# Patient Record
Sex: Male | Born: 1935
Health system: Southern US, Community
[De-identification: ages and names within clinical notes are randomized; demographics above are authoritative.]

## PROBLEM LIST (undated history)

## (undated) DIAGNOSIS — Z87442 Personal history of urinary calculi: Secondary | ICD-10-CM

## (undated) DIAGNOSIS — G309 Alzheimer's disease, unspecified: Secondary | ICD-10-CM

## (undated) DIAGNOSIS — R9431 Abnormal electrocardiogram [ECG] [EKG]: Secondary | ICD-10-CM

## (undated) DIAGNOSIS — N259 Disorder resulting from impaired renal tubular function, unspecified: Secondary | ICD-10-CM

## (undated) DIAGNOSIS — E785 Hyperlipidemia, unspecified: Secondary | ICD-10-CM

## (undated) DIAGNOSIS — M109 Gout, unspecified: Secondary | ICD-10-CM

## (undated) DIAGNOSIS — F0280 Dementia in other diseases classified elsewhere without behavioral disturbance: Secondary | ICD-10-CM

## (undated) DIAGNOSIS — M25519 Pain in unspecified shoulder: Secondary | ICD-10-CM

## (undated) DIAGNOSIS — I10 Essential (primary) hypertension: Secondary | ICD-10-CM

## (undated) DIAGNOSIS — K8020 Calculus of gallbladder without cholecystitis without obstruction: Secondary | ICD-10-CM

## (undated) DIAGNOSIS — E1129 Type 2 diabetes mellitus with other diabetic kidney complication: Secondary | ICD-10-CM

## (undated) HISTORY — DX: Hyperlipidemia, unspecified: E78.5

## (undated) HISTORY — DX: Essential (primary) hypertension: I10

## (undated) HISTORY — DX: Disorder resulting from impaired renal tubular function, unspecified: N25.9

## (undated) HISTORY — DX: Personal history of urinary calculi: Z87.442

## (undated) HISTORY — DX: Pain in unspecified shoulder: M25.519

## (undated) HISTORY — DX: Type 2 diabetes mellitus with other diabetic kidney complication: E11.29

## (undated) HISTORY — DX: Abnormal electrocardiogram (ECG) (EKG): R94.31

## (undated) HISTORY — DX: Calculus of gallbladder without cholecystitis without obstruction: K80.20

## (undated) HISTORY — DX: Gout, unspecified: M10.9

---
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
## 1976-06-19 HISTORY — PX: NASAL SEPTUM SURGERY: SHX37

## 1999-06-16 ENCOUNTER — Encounter: Admission: RE | Admit: 1999-06-16 | Discharge: 1999-09-14 | Payer: Self-pay | Admitting: Endocrinology

## 2002-10-15 ENCOUNTER — Encounter: Payer: Self-pay | Admitting: Endocrinology

## 2002-10-15 ENCOUNTER — Encounter: Admission: RE | Admit: 2002-10-15 | Discharge: 2002-10-15 | Payer: Self-pay | Admitting: Endocrinology

## 2003-12-03 ENCOUNTER — Ambulatory Visit (HOSPITAL_COMMUNITY): Admission: RE | Admit: 2003-12-03 | Discharge: 2003-12-03 | Payer: Self-pay | Admitting: Endocrinology

## 2005-03-08 ENCOUNTER — Ambulatory Visit: Payer: Self-pay | Admitting: Endocrinology

## 2005-03-13 ENCOUNTER — Ambulatory Visit: Payer: Self-pay | Admitting: Endocrinology

## 2005-04-12 ENCOUNTER — Ambulatory Visit: Payer: Self-pay | Admitting: Endocrinology

## 2005-04-17 ENCOUNTER — Ambulatory Visit: Payer: Self-pay | Admitting: Endocrinology

## 2005-05-15 ENCOUNTER — Ambulatory Visit: Payer: Self-pay | Admitting: Endocrinology

## 2005-05-18 ENCOUNTER — Ambulatory Visit: Payer: Self-pay | Admitting: Endocrinology

## 2006-04-20 ENCOUNTER — Ambulatory Visit: Payer: Self-pay | Admitting: Endocrinology

## 2006-11-20 ENCOUNTER — Ambulatory Visit: Payer: Self-pay | Admitting: Endocrinology

## 2006-11-20 LAB — CONVERTED CEMR LAB: Hgb A1c MFr Bld: 5.9 % (ref 4.6–6.0)

## 2007-03-15 ENCOUNTER — Encounter: Payer: Self-pay | Admitting: *Deleted

## 2007-03-15 DIAGNOSIS — E785 Hyperlipidemia, unspecified: Secondary | ICD-10-CM

## 2007-03-15 DIAGNOSIS — M109 Gout, unspecified: Secondary | ICD-10-CM

## 2007-03-15 DIAGNOSIS — E1129 Type 2 diabetes mellitus with other diabetic kidney complication: Secondary | ICD-10-CM

## 2007-03-15 DIAGNOSIS — K8020 Calculus of gallbladder without cholecystitis without obstruction: Secondary | ICD-10-CM | POA: Insufficient documentation

## 2007-03-15 DIAGNOSIS — I10 Essential (primary) hypertension: Secondary | ICD-10-CM

## 2007-03-15 DIAGNOSIS — Z87442 Personal history of urinary calculi: Secondary | ICD-10-CM

## 2007-03-15 HISTORY — DX: Type 2 diabetes mellitus with other diabetic kidney complication: E11.29

## 2007-03-15 HISTORY — DX: Calculus of gallbladder without cholecystitis without obstruction: K80.20

## 2007-03-15 HISTORY — DX: Hyperlipidemia, unspecified: E78.5

## 2007-03-15 HISTORY — DX: Gout, unspecified: M10.9

## 2007-03-15 HISTORY — DX: Essential (primary) hypertension: I10

## 2007-03-15 HISTORY — DX: Personal history of urinary calculi: Z87.442

## 2007-03-20 ENCOUNTER — Ambulatory Visit: Payer: Self-pay | Admitting: Endocrinology

## 2007-04-23 ENCOUNTER — Ambulatory Visit: Payer: Self-pay | Admitting: Endocrinology

## 2007-04-24 LAB — CONVERTED CEMR LAB
ALT: 39 units/L (ref 0–53)
Bilirubin Urine: NEGATIVE
Bilirubin, Direct: 0.2 mg/dL (ref 0.0–0.3)
Calcium: 9.3 mg/dL (ref 8.4–10.5)
Cholesterol: 201 mg/dL (ref 0–200)
Creatinine,U: 232.8 mg/dL
Eosinophils Absolute: 0.1 10*3/uL (ref 0.0–0.6)
Eosinophils Relative: 1.7 % (ref 0.0–5.0)
GFR calc Af Amer: 60 mL/min
GFR calc non Af Amer: 49 mL/min
Glucose, Bld: 99 mg/dL (ref 70–99)
HDL: 38.9 mg/dL — ABNORMAL LOW (ref >39.0)
Hemoglobin, Urine: NEGATIVE
Hgb A1c MFr Bld: 5.7 % (ref 4.6–6.0)
Lymphocytes Relative: 28.5 % (ref 12.0–46.0)
MCV: 82.6 fL (ref 78.0–100.0)
Microalb Creat Ratio: 20.6 mg/g (ref 0.0–30.0)
Microalb, Ur: 4.8 mg/dL — ABNORMAL HIGH (ref 0.0–1.9)
Monocytes Relative: 7.1 % (ref 3.0–11.0)
Neutro Abs: 3.6 10*3/uL (ref 1.4–7.7)
Platelets: 146 10*3/uL — ABNORMAL LOW (ref 150–400)
Triglycerides: 161 mg/dL — ABNORMAL HIGH (ref 0–149)
Urine Glucose: NEGATIVE mg/dL
WBC: 5.8 10*3/uL (ref 4.5–10.5)

## 2007-04-29 ENCOUNTER — Ambulatory Visit: Payer: Self-pay | Admitting: Endocrinology

## 2007-08-23 ENCOUNTER — Ambulatory Visit: Payer: Self-pay | Admitting: Endocrinology

## 2007-08-23 LAB — CONVERTED CEMR LAB
BUN: 20 mg/dL (ref 6–23)
GFR calc Af Amer: 65 mL/min
Hgb A1c MFr Bld: 5.7 % (ref 4.6–6.0)
LDL Cholesterol: 120 mg/dL — ABNORMAL HIGH (ref 0–99)
Potassium: 3.8 meq/L (ref 3.5–5.1)
Total CHOL/HDL Ratio: 5.4
Triglycerides: 161 mg/dL — ABNORMAL HIGH (ref 0–149)
Uric Acid, Serum: 6 mg/dL (ref 2.4–7.0)
VLDL: 32 mg/dL (ref 0–40)

## 2007-09-18 ENCOUNTER — Emergency Department (HOSPITAL_COMMUNITY): Admission: EM | Admit: 2007-09-18 | Discharge: 2007-09-18 | Payer: Self-pay | Admitting: Emergency Medicine

## 2007-09-18 ENCOUNTER — Telehealth: Payer: Self-pay | Admitting: Endocrinology

## 2008-03-17 ENCOUNTER — Telehealth: Payer: Self-pay | Admitting: Internal Medicine

## 2008-04-03 ENCOUNTER — Ambulatory Visit: Payer: Self-pay | Admitting: Endocrinology

## 2008-04-29 ENCOUNTER — Ambulatory Visit: Payer: Self-pay | Admitting: Endocrinology

## 2008-04-29 DIAGNOSIS — M25519 Pain in unspecified shoulder: Secondary | ICD-10-CM

## 2008-04-29 HISTORY — DX: Pain in unspecified shoulder: M25.519

## 2008-10-06 ENCOUNTER — Telehealth: Payer: Self-pay | Admitting: Endocrinology

## 2008-10-09 ENCOUNTER — Telehealth: Payer: Self-pay | Admitting: Endocrinology

## 2009-03-30 ENCOUNTER — Ambulatory Visit: Payer: Self-pay | Admitting: Endocrinology

## 2009-04-30 ENCOUNTER — Encounter (INDEPENDENT_AMBULATORY_CARE_PROVIDER_SITE_OTHER): Payer: Self-pay | Admitting: *Deleted

## 2009-04-30 ENCOUNTER — Ambulatory Visit: Payer: Self-pay | Admitting: Endocrinology

## 2009-04-30 DIAGNOSIS — N259 Disorder resulting from impaired renal tubular function, unspecified: Secondary | ICD-10-CM

## 2009-04-30 DIAGNOSIS — D696 Thrombocytopenia, unspecified: Secondary | ICD-10-CM

## 2009-04-30 HISTORY — DX: Disorder resulting from impaired renal tubular function, unspecified: N25.9

## 2009-09-27 ENCOUNTER — Encounter: Payer: Self-pay | Admitting: Endocrinology

## 2009-10-29 ENCOUNTER — Ambulatory Visit: Payer: Self-pay | Admitting: Endocrinology

## 2009-10-29 LAB — CONVERTED CEMR LAB
Basophils Absolute: 0 10*3/uL (ref 0.0–0.1)
CO2: 31 meq/L (ref 19–32)
Chloride: 104 meq/L (ref 96–112)
GFR calc non Af Amer: 53.79 mL/min (ref >60)
Glucose, Bld: 97 mg/dL (ref 70–99)
HCT: 42.2 % (ref 39.0–52.0)
HDL: 37.5 mg/dL — ABNORMAL LOW (ref >39.00)
Lymphs Abs: 2.1 10*3/uL (ref 0.7–4.0)
Monocytes Absolute: 0.5 10*3/uL (ref 0.1–1.0)
Monocytes Relative: 7.3 % (ref 3.0–12.0)
Neutrophils Relative %: 62.1 % (ref 43.0–77.0)
Platelets: 151 10*3/uL (ref 150.0–400.0)
Potassium: 4.5 meq/L (ref 3.5–5.1)
RDW: 15.6 % — ABNORMAL HIGH (ref 11.5–14.6)
Sodium: 144 meq/L (ref 135–145)
Total CHOL/HDL Ratio: 5
Triglycerides: 178 mg/dL — ABNORMAL HIGH (ref 0.0–149.0)
VLDL: 35.6 mg/dL (ref 0.0–40.0)

## 2010-05-03 ENCOUNTER — Ambulatory Visit: Payer: Self-pay | Admitting: Endocrinology

## 2010-05-03 ENCOUNTER — Encounter: Payer: Self-pay | Admitting: Endocrinology

## 2010-05-03 DIAGNOSIS — R9431 Abnormal electrocardiogram [ECG] [EKG]: Secondary | ICD-10-CM

## 2010-05-03 HISTORY — DX: Abnormal electrocardiogram (ECG) (EKG): R94.31

## 2010-05-03 LAB — CONVERTED CEMR LAB
BUN: 16 mg/dL (ref 6–23)
Basophils Absolute: 0 10*3/uL (ref 0.0–0.1)
Bilirubin, Direct: 0.1 mg/dL (ref 0.0–0.3)
CO2: 29 meq/L (ref 19–32)
Chloride: 102 meq/L (ref 96–112)
Cholesterol: 159 mg/dL (ref 0–200)
Creatinine, Ser: 1.6 mg/dL — ABNORMAL HIGH (ref 0.4–1.5)
Eosinophils Absolute: 0.1 10*3/uL (ref 0.0–0.7)
Hgb A1c MFr Bld: 5.8 % (ref 4.6–6.5)
LDL Cholesterol: 101 mg/dL — ABNORMAL HIGH (ref 0–99)
Leukocytes, UA: NEGATIVE
MCHC: 31.9 g/dL (ref 30.0–36.0)
MCV: 84.5 fL (ref 78.0–100.0)
Monocytes Absolute: 0.4 10*3/uL (ref 0.1–1.0)
Neutrophils Relative %: 61.6 % (ref 43.0–77.0)
Nitrite: NEGATIVE
Platelets: 137 10*3/uL — ABNORMAL LOW (ref 150.0–400.0)
Specific Gravity, Urine: 1.02 (ref 1.000–1.030)
Total Bilirubin: 1 mg/dL (ref 0.3–1.2)
Triglycerides: 105 mg/dL (ref 0.0–149.0)
WBC: 6.1 10*3/uL (ref 4.5–10.5)
pH: 5.5 (ref 5.0–8.0)

## 2010-05-06 ENCOUNTER — Ambulatory Visit: Payer: Self-pay | Admitting: Endocrinology

## 2010-05-06 LAB — CONVERTED CEMR LAB
Iron: 115 ug/dL (ref 42–165)
Saturation Ratios: 38.8 % (ref 20.0–50.0)
Vitamin B-12: 738 pg/mL (ref 211–911)

## 2010-05-10 ENCOUNTER — Telehealth (INDEPENDENT_AMBULATORY_CARE_PROVIDER_SITE_OTHER): Payer: Self-pay | Admitting: Radiology

## 2010-05-11 ENCOUNTER — Encounter: Payer: Self-pay | Admitting: Endocrinology

## 2010-06-22 ENCOUNTER — Ambulatory Visit: Admit: 2010-06-22 | Payer: Self-pay | Admitting: Endocrinology

## 2010-07-17 LAB — CONVERTED CEMR LAB
ALT: 19 units/L (ref 0–53)
AST: 21 units/L (ref 0–37)
Albumin: 3.6 g/dL (ref 3.5–5.2)
Albumin: 3.9 g/dL (ref 3.5–5.2)
Alkaline Phosphatase: 48 units/L (ref 39–117)
BUN: 16 mg/dL (ref 6–23)
BUN: 26 mg/dL — ABNORMAL HIGH (ref 6–23)
Bacteria, UA: NEGATIVE
Basophils Absolute: 0 10*3/uL (ref 0.0–0.1)
Basophils Relative: 0.3 % (ref 0.0–3.0)
Bilirubin Urine: NEGATIVE
Bilirubin, Direct: 0.2 mg/dL (ref 0.0–0.3)
CO2: 31 meq/L (ref 19–32)
CO2: 32 meq/L (ref 19–32)
Calcium: 9.2 mg/dL (ref 8.4–10.5)
Chloride: 104 meq/L (ref 96–112)
Creatinine,U: 171.7 mg/dL
Crystals: NEGATIVE
Eosinophils Absolute: 0.1 10*3/uL (ref 0.0–0.7)
Eosinophils Absolute: 0.1 10*3/uL (ref 0.0–0.7)
Eosinophils Relative: 1.8 % (ref 0.0–5.0)
GFR calc Af Amer: 64 mL/min
GFR calc non Af Amer: 55.03 mL/min (ref >60)
Glucose, Bld: 105 mg/dL — ABNORMAL HIGH (ref 70–99)
Glucose, Bld: 95 mg/dL (ref 70–99)
HCT: 41.2 % (ref 39.0–52.0)
HCT: 41.8 % (ref 39.0–52.0)
HDL: 37.3 mg/dL — ABNORMAL LOW (ref >39.00)
Hemoglobin, Urine: NEGATIVE
Hemoglobin: 13.6 g/dL (ref 13.0–17.0)
Ketones, ur: NEGATIVE mg/dL
Ketones, ur: NEGATIVE mg/dL
Leukocytes, UA: NEGATIVE
Lymphocytes Relative: 35.2 % (ref 12.0–46.0)
Lymphs Abs: 2 10*3/uL (ref 0.7–4.0)
MCHC: 32.6 g/dL (ref 30.0–36.0)
MCV: 83.1 fL (ref 78.0–100.0)
Microalb Creat Ratio: 4.1 mg/g (ref 0.0–30.0)
Microalb, Ur: 0.7 mg/dL (ref 0.0–1.9)
Monocytes Absolute: 0.4 10*3/uL (ref 0.1–1.0)
Monocytes Relative: 7.6 % (ref 3.0–12.0)
Monocytes Relative: 7.6 % (ref 3.0–12.0)
Neutro Abs: 3.3 10*3/uL (ref 1.4–7.7)
Neutrophils Relative %: 58 % (ref 43.0–77.0)
PSA: 1.44 ng/mL (ref 0.10–4.00)
Platelets: 131 10*3/uL — ABNORMAL LOW (ref 150.0–400.0)
Potassium: 4.1 meq/L (ref 3.5–5.1)
RBC / HPF: NONE SEEN
RBC: 4.96 M/uL (ref 4.22–5.81)
RDW: 14.5 % (ref 11.5–14.6)
Sed Rate: 10 mm/hr (ref 0–16)
Sodium: 142 meq/L (ref 135–145)
Sodium: 142 meq/L (ref 135–145)
Specific Gravity, Urine: 1.015 (ref 1.000–1.03)
Squamous Epithelial / LPF: NEGATIVE /lpf
TSH: 1.56 microintl units/mL (ref 0.35–5.50)
TSH: 1.58 microintl units/mL (ref 0.35–5.50)
Total Bilirubin: 0.9 mg/dL (ref 0.3–1.2)
Total CHOL/HDL Ratio: 5.1
Total Protein, Urine: NEGATIVE mg/dL
Total Protein: 6.6 g/dL (ref 6.0–8.3)
Triglycerides: 123 mg/dL (ref 0.0–149.0)
Urine Glucose: NEGATIVE mg/dL
Urine Glucose: NEGATIVE mg/dL
Urobilinogen, UA: 0.2 (ref 0.0–1.0)
Urobilinogen, UA: 0.2 (ref 0.0–1.0)
WBC: 5.9 10*3/uL (ref 4.5–10.5)

## 2010-07-21 NOTE — Letter (Signed)
Summary: Geralynn Rile  [facility]   Imported By: Lester  09/30/2009 10:01:46  _____________________________________________________________________  External Attachment:    Type:   Image     Comment:   External Document

## 2010-07-21 NOTE — Assessment & Plan Note (Signed)
Summary: 6 mth yearly---stc   Vital Signs:  Patient profile:   75 year old male Height:      68 inches (172.72 cm) Weight:      253 pounds (115.00 kg) BMI:     38.61 O2 Sat:      98 % on Room air Temp:     98.5 degrees F (36.94 degrees C) oral Pulse rate:   62 / minute BP sitting:   124 / 76  (left arm) Cuff size:   large  Vitals Entered By: Brenton Grills CMA (AAMA) (May 03, 2010 8:21 AM)  O2 Flow:  Room air CC: Yearly Visit/aj Is Patient Diabetic? Yes   Primary Provider:  ellison  CC:  Yearly Visit/aj.  History of Present Illness: pt is here for medicare welllness visit.  he denies memory loss and depression.  he says he is able to perform activities of daily living without assistance.  he has no limitations to physical activity.     Current Medications (verified): 1)  Actos 45 Mg  Tabs (Pioglitazone Hcl) .... Take 1 By Mouth Qd 2)  Adult Aspirin Low Strength 81 Mg  Tbdp (Aspirin) .... Take 1 By Mouth Qd 3)  Allopurinol 300 Mg  Tabs (Allopurinol) .... Take 1 By Mouth Qd 4)  Hyzaar 100-25 Mg  Tabs (Losartan Potassium-Hctz) .... Take 1 By Mouth Qd 5)  Lasix 20 Mg  Tabs (Furosemide) .... Qd 6)  Cardura 4 Mg  Tabs (Doxazosin Mesylate) .... Qhs 7)  Colestipol Hcl 1 Gm  Tabs (Colestipol Hcl) .... 5 Qd 8)  Bayer Breeze 2 Test  Disk (Glucose Blood) .Marland Kitchen.. 1 Bid 9)  Metoprolol Succinate 25 Mg Xr24h-Tab (Metoprolol Succinate) .Marland Kitchen.. 1 Qd 10)  Amlodipine Besylate 5 Mg Tabs (Amlodipine Besylate) .Marland Kitchen.. 1 Once Daily  Allergies (verified): 1)  ! Lotensin 2)  ! * Caudet 3)  ! Zetia (Ezetimibe) 4)  Caduet (Amlodipine-Atorvastatin)  Past History:  Past Medical History: ROUTINE GENERAL MEDICAL EXAM@[email or web address]  CARE FACL (ICD-V70.0) SPECIAL SCREENING MALIGNANT NEOPLASM OF PROSTATE (ICD-V76.44) UROLITHIASIS, HX OF (ICD-V13.01) GALLSTONES (ICD-574.20) NEPHROPATHY, DIABETIC (ICD-250.40) HYPERTENSION (ICD-401.9) HYPERLIPIDEMIA (ICD-272.4) GOUT (ICD-274.9) DIABETES MELLITUS, TYPE II  (ICD-250.00)  opthal: dr Nile Riggs  Social History: Reviewed history from 04/29/2008 and no changes required. he is retired  divorced. nok is dtr angelia Grizzle nonsmoker no alcohol no illegal drugs  Review of Systems  The patient denies vision loss and decreased hearing.    Physical Exam  General:  obese.  no distress  Eyes:  (sees opthal) Ears:  grossly normal hearing.   Msk:  pt easily and quickly performs "get-up-and-go" from a sitting position. Cervical Nodes:  No significant adenopathy.  Psych:  remembers 2/3 at 5 minutes, but excellent recall.  can easily read and write a sentence.  alert and oriented x 3. Additional Exam:  SEPARATE EVALUATION FOLLOWS--EACH PROBLEM HERE IS NEW, NOT RESPONDING TO TREATMENT, OR POSES SIGNIFICANT RISK TO THE PATIENT'S HEALTH: HISTORY OF THE PRESENT ILLNESS: pt states few yesrs of slight swelling of the legs.  no assoc sob PAST MEDICAL HISTORY reviewed and up to date today REVIEW OF SYSTEMS: denies chest pain and chest tightness PHYSICAL EXAMINATION: dorsalis pedis intact bilat.   no deformity.  no ulcer on the feet.  feet are of normal color and temp.  there is 1+ bilat pretib edema sensation is intact to touch on the feet LAB/XRAY RESULTS: abnormal ecg is noted anemia is noted IMPRESSION: anemia, uncertain etiology abnormal ecg, uncertain etiology edema due to norvasc  PLAN: see instruction sheet   Impression & Recommendations:  Problem # 1:  ROUTINE GENERAL MEDICAL EXAM@HEALTH  CARE FACL (ICD-V70.0)  Medications Added to Medication List This Visit: 1)  Amlodipine Besylate 2.5 Mg Tabs (Amlodipine besylate) .Marland Kitchen.. 1 tab once daily  Other Orders: EKG w/ Interpretation (93000) Cardiolite (Cardiolite) TLB-Lipid Panel (80061-LIPID) TLB-BMP (Basic Metabolic Panel-BMET) (80048-METABOL) TLB-CBC Platelet - w/Differential (85025-CBCD) TLB-Hepatic/Liver Function Pnl (80076-HEPATIC) TLB-TSH (Thyroid Stimulating Hormone)  (84443-TSH) TLB-A1C / Hgb A1C (Glycohemoglobin) (83036-A1C) TLB-Udip w/ Micro (81001-URINE) TLB-Uric Acid, Blood (84550-URIC) TLB-PSA (Prostate Specific Antigen) (84153-PSA) Est. Patient Level IV (16109) Medicare -1st Annual Wellness Visit [phone])  Patient Instructions: 1)  blood tests are being ordered for you today.  please call [phone] to hear your test results. 2)  please consider these measures for your health:  minimize alcohol.  do not use tobacco products.  have a colonoscopy at least every 10 years from age 39.  keep firearms safely stored.  always use seat belts.  have working smoke alarms in your home.  see an eye doctor and dentist regularly.  never drive under the influence of alcohol or drugs (including prescription drugs).  3)  please let me know what your wishes would be, if artificial life support measures should become necessary.  it is critically important to prevent falling down (keep floor areas well-lit, dry, and free of loose objects). 4)  good diet and exercise habits significanly improve the control of your diabetes.  please let me know if you wish to be referred to a dietician.  high blood sugar is very risky to your health.  you should see an eye doctor every year. 5)  controlling your blood pressure and cholesterol drastically reduces the damage diabetes does to your body.  this also applies to quitting smoking.  please discuss these with your doctor.  you should take an aspirin every day, unless you have been advised by a doctor not to. 6)  Please schedule a follow-up appointment in 6 months. 7)  reduce amlodipine to 2.5 mg once daily 8)  check treadmill heart x ray.  you will be called with a day and time for an appointment. 9)  (update: i left message on phone-tree:  come in for iron panel and b-12 285.9) Prescriptions: AMLODIPINE BESYLATE 2.5 MG TABS (AMLODIPINE BESYLATE) 1 tab once daily  #30 x 11   Entered and Authorized by:   Minus Breeding MD   Signed by:    Minus Breeding MD on 05/03/2010   Method used:   Electronically to        [facility] (retail)       [address]       [place], [place]  [address]       Ph: [phone]       Fax: [phone]   RxID:   [id number]    Orders Added: 1)  EKG w/ Interpretation [93000] 2)  Cardiolite [Cardiolite] 3)  TLB-Lipid Panel [80061-LIPID] 4)  TLB-BMP (Basic Metabolic Panel-BMET) [80048-METABOL] 5)  TLB-CBC Platelet - w/Differential [85025-CBCD] 6)  TLB-Hepatic/Liver Function Pnl [80076-HEPATIC] 7)  TLB-TSH (Thyroid Stimulating Hormone) [84443-TSH] 8)  TLB-A1C / Hgb A1C (Glycohemoglobin) [83036-A1C] 9)  TLB-Udip w/ Micro [81001-URINE] 10)  TLB-Uric Acid, Blood [84550-URIC] 11)  TLB-PSA (Prostate Specific Antigen) [84153-PSA] 12)  Est. Patient Level IV [44034] 13)  Medicare -1st Annual Wellness Visit [G0438]   Immunization History:  Influenza Immunization History:    Influenza:  historical (03/19/2010)   Immunization History:  Influenza Immunization History:    Influenza:  Historical (03/19/2010)

## 2010-07-21 NOTE — Assessment & Plan Note (Signed)
Summary: 6 MTH FU  STC   Vital Signs:  Patient profile:   75 year old [name] Height:      68 inches (172.72 cm) Weight:      261.13 pounds (118.70 kg) O2 Sat:      96 % on Room air Temp:     98.4 degrees F (36.89 degrees C) oral Pulse rate:   80 / minute BP sitting:   102 / 64  (left arm) Cuff size:   large  Vitals Entered By: Josph Macho RMA (Oct 29, 2009 8:05 AM)  O2 Flow:  Room air CC: 6 month follow up/ CF Is Patient Diabetic? Yes   Primary Provider:  Verlisa Vara  CC:  6 month follow up/ CF.  History of Present Illness: the status of at least 3 ongoing medical problems is addressed today: dyslipidemia:  pt states he feels well in general.  he has not tolerated lipid meds. no cbg record, but states cbg's are well-controlled. htn:  he has ongoing edema.   Current Medications (verified): 1)  Actos 45 Mg  Tabs (Pioglitazone Hcl) .... Take 1 By Mouth Qd 2)  Adult Aspirin Low Strength 81 Mg  Tbdp (Aspirin) .... Take 1 By Mouth Qd 3)  Allopurinol 300 Mg  Tabs (Allopurinol) .... Take 1 By Mouth Qd 4)  Hyzaar 100-25 Mg  Tabs (Losartan Potassium-Hctz) .... Take 1 By Mouth Qd 5)  Zetia 10 Mg  Tabs (Ezetimibe) .... Take 1 By Mouth Qd 6)  Norvasc 10 Mg Tabs (Amlodipine Besylate) .... Take 1 Tablet By Mouth Once A Day 7)  Lasix Anthony Mg  Tabs (Furosemide) .... Qd 8)  Cardura 4 Mg  Tabs (Doxazosin Mesylate) .... Qhs 9)  Colestipol Hcl 1 Gm  Tabs (Colestipol Hcl) .... 5 Qd 10)  Bayer Breeze 2 Test  Disk (Glucose Blood) .Marland Kitchen.. 1 Bid 11)  Metoprolol Succinate 25 Mg Xr24h-Tab (Metoprolol Succinate) .Marland Kitchen.. 1 Qd  Allergies (verified): 1)  ! Lotensin 2)  ! * Caudet 3)  ! Zetia (Ezetimibe) 4)  Caduet (Amlodipine-Atorvastatin)  Past History:  Past Medical History: Last updated: 04/29/2008 ROUTINE GENERAL MEDICAL EXAM@HEALTH  CARE FACL (ICD-V70.0) SPECIAL SCREENING MALIGNANT NEOPLASM OF PROSTATE (ICD-V76.44) UROLITHIASIS, HX OF (ICD-V13.01) GALLSTONES (ICD-574.Anthony) NEPHROPATHY, DIABETIC  (ICD-250.40) HYPERTENSION (ICD-401.9) HYPERLIPIDEMIA (ICD-272.4) GOUT (ICD-274.9) DIABETES MELLITUS, TYPE II (ICD-250.00)  Review of Systems  The patient denies chest pain and dyspnea on exertion.    Physical Exam  General:  obese.   Pulses:  dorsalis pedis intact bilat.  no carotid bruit  Extremities:  no deformity.  no ulcer on the feet.  feet are of normal color and temp.   1+ right pedal edema and 1+ left pedal edema.   Neurologic:  sensation is intact to touch on the feet  Additional Exam:  Hemoglobin A1C            5.7 % LDL Cholesterol      [H]  161 mg/dL    Impression & Recommendations:  Problem # 1:  HYPERLIPIDEMIA (ICD-272.4) therapy is limited by multiple perceived drug intolerances  Problem # 2:  DIABETES MELLITUS, TYPE II (ICD-250.00) well-controlled  Problem # 3:  edema poss due to norvasc and actos  Problem # 4:  HYPERTENSION (ICD-401.9) overcontrolled  Medications Added to Medication List This Visit: 1)  Amlodipine Besylate 5 Mg Tabs (Amlodipine besylate) .Marland Kitchen.. 1 once daily  Other Orders: TLB-BMP (Basic Metabolic Panel-BMET) (80048-METABOL) TLB-A1C / Hgb A1C (Glycohemoglobin) (83036-A1C) TLB-Lipid Panel (80061-LIPID) TLB-CBC Platelet - w/Differential (85025-CBCD) Prescription Created  Electronically [phone]) Est. Patient Level IV (98119)  Patient Instructions: 1)  reduce amlodipine to 5 mg once daily 2)  Please schedule a physical appointment in 6 months. 3)  tests are being ordered for you today.  a few days after the test(s), please call [phone] to hear your test results. 4)  (update: i left message on phone-tree:  rx as we discussed) Prescriptions: AMLODIPINE BESYLATE 5 MG TABS (AMLODIPINE BESYLATE) 1 once daily  #90 x 3   Entered and Authorized by:   Minus Breeding MD   Signed by:   Minus Breeding MD on 10/29/2009   Method used:   Electronically to        [facility] (retail)       [address]       [place], [place]  [address]       Ph: [phone]       Fax: [phone]   RxID:   [id number] AMLODIPINE BESYLATE 5 MG TABS (AMLODIPINE BESYLATE) 1 once daily  #30 x 11   Entered and Authorized by:   Minus Breeding MD   Signed by:   Minus Breeding MD on 10/29/2009   Method used:   Electronically to        [facility] (retail)       [address]       [place], [place]  [address]       Ph: [phone]       Fax: [phone]   RxID:   [id number]

## 2010-07-21 NOTE — Miscellaneous (Signed)
Summary: Appointment No Show  Appointment status changed to no show by [employer] on 05/11/2010 11:03 AM.  No Show Comments ---------------- CRS/ABNORNAL ECHO/WT:253/INSU:MEDICARE/MD:ELLISON-MB  Appointment Information ----------------------- Appt Type:  CARDIOLOGY NUCLEAR TESTING      Date:  Wednesday, May 11, 2010      Time:  9:15 AM for 15 min   Urgency:  Routine   Made By:  Pearson Grippe  To Visit:  [id number]    Reason:  CRS/ABNORNAL ECHO/WT:253/INSU:MEDICARE/MD:ELLISON-MB  Appt Comments ------------- -- 05/11/10 11:03: (CEMR) NO SHOW -- CRS/ABNORNAL ECHO/WT:253/INSU:MEDICARE/MD:ELLISON-MB -- 05/03/10 9:17: (CEMR) BOOKED -- Routine CARDIOLOGY NUCLEAR TESTING at 05/11/2010 9:15 AM for 15 min CRS/ABNORNAL ECHO/WT:253/INSU:MEDICARE/MD:ELLISON-MB

## 2010-07-21 NOTE — Progress Notes (Signed)
Summary: nuc pre-procedure  Phone Note Outgoing Call   Call placed by: Domenic Polite, CNMT,  May 10, 2010 11:51 AM Call placed to: Patient Reason for Call: Confirm/change Appt Summary of Call: Tried to call patient several times , there was no answer. Initial call taken by: Domenic Polite, CNMT,  May 10, 2010 11:51 AM     Nuclear Med Background Indications for Stress Test: Evaluation for Ischemia, Abnormal EKG   History: Myocardial Perfusion Study  History Comments: '05 MPI apical thinning/ ef =69%     Nuclear Pre-Procedure Cardiac Risk Factors: Hypertension, Lipids, NIDDM Height (in): 68   Appended Document: nuc pre-procedure Pt no show'd today. Attempted to call. No answer and no answering machine.

## 2010-10-18 ENCOUNTER — Ambulatory Visit: Payer: Self-pay | Admitting: Endocrinology

## 2010-10-18 DIAGNOSIS — Z0289 Encounter for other administrative examinations: Secondary | ICD-10-CM

## 2010-10-28 ENCOUNTER — Encounter: Payer: Self-pay | Admitting: Endocrinology

## 2010-10-28 ENCOUNTER — Ambulatory Visit (INDEPENDENT_AMBULATORY_CARE_PROVIDER_SITE_OTHER): Payer: Medicare Other | Admitting: Endocrinology

## 2010-10-28 ENCOUNTER — Other Ambulatory Visit (INDEPENDENT_AMBULATORY_CARE_PROVIDER_SITE_OTHER): Payer: Medicare Other

## 2010-10-28 DIAGNOSIS — N259 Disorder resulting from impaired renal tubular function, unspecified: Secondary | ICD-10-CM

## 2010-10-28 DIAGNOSIS — D649 Anemia, unspecified: Secondary | ICD-10-CM

## 2010-10-28 DIAGNOSIS — E119 Type 2 diabetes mellitus without complications: Secondary | ICD-10-CM

## 2010-10-28 DIAGNOSIS — Z0000 Encounter for general adult medical examination without abnormal findings: Secondary | ICD-10-CM | POA: Insufficient documentation

## 2010-10-28 LAB — BASIC METABOLIC PANEL
BUN: 15 mg/dL (ref 6–23)
Calcium: 9.3 mg/dL (ref 8.4–10.5)
GFR: 62.39 mL/min (ref >60.00)
Glucose, Bld: 89 mg/dL (ref 70–99)
Sodium: 143 mEq/L (ref 135–145)

## 2010-10-28 LAB — CBC WITH DIFFERENTIAL/PLATELET
Eosinophils Absolute: 0.1 10*3/uL (ref 0.0–0.7)
Eosinophils Relative: 1.5 % (ref 0.0–5.0)
HCT: 40.7 % (ref 39.0–52.0)
Lymphs Abs: 2.4 10*3/uL (ref 0.7–4.0)
MCHC: 32.3 g/dL (ref 30.0–36.0)
MCV: 85.4 fl (ref 78.0–100.0)
Monocytes Absolute: 0.4 10*3/uL (ref 0.1–1.0)
Neutrophils Relative %: 61.1 % (ref 43.0–77.0)
Platelets: 138 10*3/uL — ABNORMAL LOW (ref 150.0–400.0)
WBC: 7.6 10*3/uL (ref 4.5–10.5)

## 2010-10-28 NOTE — Patient Instructions (Addendum)
blood tests are being ordered for you today.  please call [phone] to hear your test results.  You will be prompted to enter the 9-digit "MRN" number that appears at the top left of this page, followed by #.  Then you will hear the message. Stop amlodipine. Please make a regular physical appointment in 6 months (update: i left message on phone-tree:  rx as we discussed)

## 2010-10-28 NOTE — Progress Notes (Signed)
Subjective:    Patient ID: Anthony [name], male    DOB: Apr 13, 1936, 75 y.o.   MRN: [id number]  HPI He takes meds for dm and htn as rx'ed.  he has 1 year of slight edema of the legs, but no assoc sob.  Past Medical History  Diagnosis Date  . UROLITHIASIS, HX OF 03/15/2007  . DIABETES MELLITUS, TYPE II 03/15/2007  . NEPHROPATHY, DIABETIC 03/15/2007  . HYPERLIPIDEMIA 03/15/2007  . GOUT 03/15/2007  . HYPERTENSION 03/15/2007  . RENAL INSUFFICIENCY 04/30/2009  . ABNORMAL ELECTROCARDIOGRAM 05/03/2010  . SHOULDER PAIN, BILATERAL 04/29/2008  . GALLSTONES 03/15/2007    Past Surgical History  Procedure Date  . Nasal septum surgery 1978    History   Social History  . Marital Status: Divorced    Spouse Name: N/A    Number of Children: N/A  . Years of Education: N/A   Occupational History  . Retired    Social History Main Topics  . Smoking status: Never Smoker   . Smokeless tobacco: Not on file  . Alcohol Use: No  . Drug Use: No  . Sexually Active:    Other Topics Concern  . Not on file   Social History Narrative   NOK is Daughter Sharol Given    Current Outpatient Prescriptions on File Prior to Visit  Medication Sig Dispense Refill  . allopurinol (ZYLOPRIM) 300 MG tablet Take 300 mg by mouth daily.        Marland Kitchen aspirin 81 MG tablet Take 81 mg by mouth daily.        . colestipol (COLESTID) 1 G tablet 5 tablets once daily       . doxazosin (CARDURA) 4 MG tablet Take 4 mg by mouth at bedtime.        . furosemide (LASIX) 20 MG tablet Take 20 mg by mouth daily.        . Glucose Blood (BAYER BREEZE 2 TEST) DISK 1 BID       . losartan-hydrochlorothiazide (HYZAAR) 100-25 MG per tablet Take 1 tablet by mouth daily.        . metoprolol succinate (TOPROL-XL) 25 MG 24 hr tablet Take 25 mg by mouth daily.        . pioglitazone (ACTOS) 45 MG tablet Take 45 mg by mouth daily.          Allergies  Allergen Reactions  . Benazepril Hcl     REACTION: Cough  . Caduet     REACTION: LEG  CRAMPS  . Ezetimibe     REACTION: edema    Family History  Problem Relation Age of Onset  . Cancer Father     Prostate Cancer    BP 122/78  Pulse 55  Temp(Src) 98.8 F (37.1 C) (Oral)  Ht 5\' 8"  (1.727 m)  Wt 213 lb (96.616 kg)  BMI 32.39 kg/m2  SpO2 97%     Review of Systems No weight gain or weight loss    Objective:   Physical Exam GENERAL: no distress.  Obese.orsalis pedis intact bilat.   no deformity.  no ulcer on the feet.  feet are of normal color and temp.  there is 1+ bilat pretib edema sensation is intact to touch on the feet     Lab Results  Component Value Date   HGBA1C 5.6 10/28/2010     Assessment & Plan:  Htn, well-controlled.  bp can prob. tolerate discontinuation of norvasc. Edema persists. It is prob contributed to by both norvasc and actos.  Dm, well-controlled

## 2011-02-07 ENCOUNTER — Other Ambulatory Visit: Payer: Self-pay | Admitting: Endocrinology

## 2011-03-14 LAB — BASIC METABOLIC PANEL
BUN: 17
Creatinine, Ser: 1.36
GFR calc Af Amer: >60
GFR calc non Af Amer: 52 — ABNORMAL LOW
Potassium: 3.5

## 2011-03-14 LAB — CBC
Platelets: 153
RBC: 5.74
WBC: 7.2

## 2011-03-14 LAB — PROTIME-INR
INR: 0.9
Prothrombin Time: 12.1

## 2011-03-14 LAB — POCT CARDIAC MARKERS
CKMB, poc: <1 — ABNORMAL LOW
Operator id: 3067
Troponin i, poc: <0.05

## 2011-03-14 LAB — DIFFERENTIAL
Basophils Relative: 1
Eosinophils Absolute: 0.2
Lymphs Abs: 2.2
Neutro Abs: 4.2
Neutrophils Relative %: 59

## 2011-04-27 ENCOUNTER — Encounter: Payer: Self-pay | Admitting: Gastroenterology

## 2011-05-01 ENCOUNTER — Encounter: Payer: Self-pay | Admitting: Gastroenterology

## 2011-05-05 ENCOUNTER — Other Ambulatory Visit (INDEPENDENT_AMBULATORY_CARE_PROVIDER_SITE_OTHER): Payer: Medicare Other

## 2011-05-05 ENCOUNTER — Encounter: Payer: Self-pay | Admitting: Endocrinology

## 2011-05-05 ENCOUNTER — Ambulatory Visit (INDEPENDENT_AMBULATORY_CARE_PROVIDER_SITE_OTHER): Payer: Medicare Other | Admitting: Endocrinology

## 2011-05-05 VITALS — BP 122/70 | HR 56 | Temp 97.9°F | Ht 68.0 in | Wt 235.4 lb

## 2011-05-05 DIAGNOSIS — Z136 Encounter for screening for cardiovascular disorders: Secondary | ICD-10-CM

## 2011-05-05 DIAGNOSIS — Z79899 Other long term (current) drug therapy: Secondary | ICD-10-CM

## 2011-05-05 DIAGNOSIS — Z125 Encounter for screening for malignant neoplasm of prostate: Secondary | ICD-10-CM

## 2011-05-05 DIAGNOSIS — N259 Disorder resulting from impaired renal tubular function, unspecified: Secondary | ICD-10-CM

## 2011-05-05 DIAGNOSIS — D649 Anemia, unspecified: Secondary | ICD-10-CM

## 2011-05-05 DIAGNOSIS — E119 Type 2 diabetes mellitus without complications: Secondary | ICD-10-CM

## 2011-05-05 DIAGNOSIS — E785 Hyperlipidemia, unspecified: Secondary | ICD-10-CM

## 2011-05-05 DIAGNOSIS — I10 Essential (primary) hypertension: Secondary | ICD-10-CM

## 2011-05-05 DIAGNOSIS — D696 Thrombocytopenia, unspecified: Secondary | ICD-10-CM

## 2011-05-05 DIAGNOSIS — M109 Gout, unspecified: Secondary | ICD-10-CM

## 2011-05-05 DIAGNOSIS — Z0000 Encounter for general adult medical examination without abnormal findings: Secondary | ICD-10-CM

## 2011-05-05 DIAGNOSIS — Z23 Encounter for immunization: Secondary | ICD-10-CM

## 2011-05-05 DIAGNOSIS — Z129 Encounter for screening for malignant neoplasm, site unspecified: Secondary | ICD-10-CM | POA: Insufficient documentation

## 2011-05-05 LAB — CBC WITH DIFFERENTIAL/PLATELET
Basophils Absolute: 0 10*3/uL (ref 0.0–0.1)
Basophils Relative: 0.4 % (ref 0.0–3.0)
Eosinophils Absolute: 0.1 10*3/uL (ref 0.0–0.7)
Lymphocytes Relative: 26.4 % (ref 12.0–46.0)
MCHC: 31.6 g/dL (ref 30.0–36.0)
Neutrophils Relative %: 65.1 % (ref 43.0–77.0)
RBC: 4.84 Mil/uL (ref 4.22–5.81)
WBC: 6 10*3/uL (ref 4.5–10.5)

## 2011-05-05 LAB — URINALYSIS, ROUTINE W REFLEX MICROSCOPIC
Bilirubin Urine: NEGATIVE
Leukocytes, UA: NEGATIVE
Nitrite: NEGATIVE
Specific Gravity, Urine: 1.015 (ref 1.000–1.030)
pH: 7 (ref 5.0–8.0)

## 2011-05-05 LAB — BASIC METABOLIC PANEL
Calcium: 9.1 mg/dL (ref 8.4–10.5)
Creatinine, Ser: 1.7 mg/dL — ABNORMAL HIGH (ref 0.4–1.5)
GFR: 52.45 mL/min — ABNORMAL LOW (ref >60.00)

## 2011-05-05 LAB — MICROALBUMIN / CREATININE URINE RATIO
Creatinine,U: 178.7 mg/dL
Microalb, Ur: 0.9 mg/dL (ref 0.0–1.9)

## 2011-05-05 LAB — HEPATIC FUNCTION PANEL
ALT: 19 U/L (ref 0–53)
Albumin: 3.9 g/dL (ref 3.5–5.2)
Total Bilirubin: 1.1 mg/dL (ref 0.3–1.2)
Total Protein: 7.7 g/dL (ref 6.0–8.3)

## 2011-05-05 LAB — IBC PANEL
Saturation Ratios: 24.1 % (ref 20.0–50.0)
Transferrin: 213.1 mg/dL (ref 212.0–360.0)

## 2011-05-05 LAB — HEMOGLOBIN A1C: Hgb A1c MFr Bld: 5.5 % (ref 4.6–6.5)

## 2011-05-05 LAB — URIC ACID: Uric Acid, Serum: 5.9 mg/dL (ref 4.0–7.8)

## 2011-05-05 LAB — LIPID PANEL: Total CHOL/HDL Ratio: 4

## 2011-05-05 NOTE — Patient Instructions (Addendum)
We can't do your psa blood test today, because of a computer problem.   please consider these measures for your health:  minimize alcohol.  do not use tobacco products.  have a colonoscopy at least every 10 years from age 75.  keep firearms safely stored.  always use seat belts.  have working smoke alarms in your home.  see an eye doctor and dentist regularly.  never drive under the influence of alcohol or drugs (including prescription drugs).   Reduce metoprolol to 1/2 pill daily. please let me know what your wishes would be, if artificial life support measures should become necessary.  it is critically important to prevent falling down (keep floor areas well-lit, dry, and free of loose objects.  If you have a cane, walker, or wheelchair, you should use it, even for short trips around the house.  Also, try not to rush). You should have a vaccine against shingles (a painful rash which results from the  chickenpox infection which most people had many years ago).  This vaccine reduces, but does not totally eliminate the risk of shingles.  Because this is a medicare part d benefit, there are 3 ways you can get it:  You can go to a pharmacy and get the injection (I can give you a prescription), or I can give you a prescription to have filled at a pharmacy, and bring back here for [place] to give.  The other option is that you can pay up-front for it, and we'll give you a form to make a claim for reimbursement from your medicare part d carrier.  Please come back for a follow-up appointment in 6 months. (update: pt requests full code) blood tests are being requested for you today.  please call [phone] to hear your test results.  You will be prompted to enter the 9-digit "MRN" number that appears at the top left of this page, followed by #.  Then you will hear the message. (update: i left message on phone-tree:  rx as we discussed)

## 2011-05-05 NOTE — Progress Notes (Signed)
Subjective:    Patient ID: Anthony [name], male    DOB: 02-Jul-1935, 75 y.o.   MRN: [id number]  HPI The state of at least three ongoing medical problems is addressed today: Pt has h/o low plts, uncertain etiology.  No bleeding. DM: He has lost weight, due to his efforts. Renal insuff: he denies chest pain and sob Dyslipidemia: he did not tolerate statin rx. Past Medical History  Diagnosis Date  . UROLITHIASIS, HX OF 03/15/2007  . DIABETES MELLITUS, TYPE II 03/15/2007  . NEPHROPATHY, DIABETIC 03/15/2007  . HYPERLIPIDEMIA 03/15/2007  . GOUT 03/15/2007  . HYPERTENSION 03/15/2007  . RENAL INSUFFICIENCY 04/30/2009  . ABNORMAL ELECTROCARDIOGRAM 05/03/2010  . SHOULDER PAIN, BILATERAL 04/29/2008  . GALLSTONES 03/15/2007    Past Surgical History  Procedure Date  . Nasal septum surgery 1978    History   Social History  . Marital Status: Divorced    Spouse Name: N/A    Number of Children: N/A  . Years of Education: N/A   Occupational History  . Retired    Social History Main Topics  . Smoking status: Never Smoker   . Smokeless tobacco: Not on file  . Alcohol Use: No  . Drug Use: No  . Sexually Active:    Other Topics Concern  . Not on file   Social History Narrative   NOK is Daughter Sharol Given    Current Outpatient Prescriptions on File Prior to Visit  Medication Sig Dispense Refill  . ACTOS 45 MG tablet TAKE ONE TABLET BY MOUTH EVERY DAY  90 each  1  . allopurinol (ZYLOPRIM) 300 MG tablet Take 300 mg by mouth daily.        Marland Kitchen aspirin 81 MG tablet Take 81 mg by mouth daily.        . colestipol (COLESTID) 1 G tablet 5 tablets once daily       . doxazosin (CARDURA) 4 MG tablet TAKE ONE TABLET BY MOUTH EVERY DAY AT BEDTIME  90 tablet  1  . furosemide (LASIX) 20 MG tablet Take 20 mg by mouth daily.        . Glucose Blood (BAYER BREEZE 2 TEST) DISK 1 BID       . losartan-hydrochlorothiazide (HYZAAR) 100-25 MG per tablet TAKE ONE TABLET BY MOUTH EVERY DAY  90 tablet  1    . metoprolol succinate (TOPROL-XL) 25 MG 24 hr tablet 1/2 tab daily        Allergies  Allergen Reactions  . Benazepril Hcl     REACTION: Cough  . Caduet     REACTION: LEG CRAMPS  . Ezetimibe     REACTION: edema    Family History  Problem Relation Age of Onset  . Cancer Father     Prostate Cancer    BP 122/70  Pulse 56  Temp(Src) 97.9 F (36.6 C) (Oral)  Ht 5\' 8"  (1.727 m)  Wt 235 lb 6 oz (106.765 kg)  BMI 35.79 kg/m2  SpO2 98%    Review of Systems  Genitourinary: Negative for difficulty urinating.  Neurological: Negative for syncope.      Objective:   Physical Exam VITAL SIGNS:  See vs page GENERAL: no distress Pulses: dorsalis pedis intact bilat.   Feet: no deformity.  no ulcer on the feet.  feet are of normal color and temp.  Trace bilat leg edema Neuro: sensation is intact to touch on the feet.   i reviewed electrocardiogram Lab Results  Component Value Date   WBC  6.0 05/05/2011   HGB 13.0 05/05/2011   HCT 41.2 05/05/2011   PLT 132.0* 05/05/2011   GLUCOSE 93 05/05/2011   CHOL 175 05/05/2011   TRIG 78.0 05/05/2011   HDL 50.00 05/05/2011   LDLDIRECT 136.7 04/30/2009   LDLCALC 109* 05/05/2011   ALT 19 05/05/2011   AST 24 05/05/2011   NA 143 05/05/2011   K 3.9 05/05/2011   CL 105 05/05/2011   CREATININE 1.7* 05/05/2011   BUN 21 05/05/2011   CO2 29 05/05/2011   TSH 1.38 05/05/2011   PSA 2.02 05/03/2010   INR 0.9 09/18/2007   HGBA1C 5.5 05/05/2011   MICROALBUR 0.9 05/05/2011      Assessment & Plan:  Thrombocytopenia, unchanged DM, well-controlled Renal insuff, stable Dyslipidemia. therapy is limited by perceived drug intolerance   Subjective:   Patient here for Medicare annual wellness visit and management of other chronic and acute problems.     Risk factors: advanced age    Roster of Physicians Providing Medical Care to Patient: Opthal: shapiro    Activities of Daily Living: In your present state of health, do you have any  difficulty performing the following activities?:  Preparing food and eating?: No  Bathing yourself: No  Getting dressed: No  Using the toilet: No  Moving around from place to place: No  In the past year have you fallen or had a near fall?: No    Home Safety: Has smoke detector and wears seat belts. No firearms.   Diet and Exercise  Current exercise habits:  Pt says good Dietary issues discussed:  Pt reports a healthy diet   Depression Screen  Q1: Over the past two weeks, have you felt down, depressed or hopeless? no  Q2: Over the past two weeks, have you felt little interest or pleasure in doing things? no   The following portions of the patient's history were reviewed and updated as appropriate: allergies, current medications, past family history, past medical history, past social history, past surgical history and problem list.  Past Medical History  Diagnosis Date  . UROLITHIASIS, HX OF 03/15/2007  . DIABETES MELLITUS, TYPE II 03/15/2007  . NEPHROPATHY, DIABETIC 03/15/2007  . HYPERLIPIDEMIA 03/15/2007  . GOUT 03/15/2007  . HYPERTENSION 03/15/2007  . RENAL INSUFFICIENCY 04/30/2009  . ABNORMAL ELECTROCARDIOGRAM 05/03/2010  . SHOULDER PAIN, BILATERAL 04/29/2008  . GALLSTONES 03/15/2007    Past Surgical History  Procedure Date  . Nasal septum surgery 1978    History   Social History  . Marital Status: Divorced    Spouse Name: N/A    Number of Children: N/A  . Years of Education: N/A   Occupational History  . Retired    Social History Main Topics  . Smoking status: Never Smoker   . Smokeless tobacco: Not on file  . Alcohol Use: No  . Drug Use: No  . Sexually Active:    Other Topics Concern  . Not on file   Social History Narrative   NOK is Daughter Sharol Given    Current Outpatient Prescriptions on File Prior to Visit  Medication Sig Dispense Refill  . ACTOS 45 MG tablet TAKE ONE TABLET BY MOUTH EVERY DAY  90 each  1  . allopurinol (ZYLOPRIM) 300 MG tablet  Take 300 mg by mouth daily.        Marland Kitchen aspirin 81 MG tablet Take 81 mg by mouth daily.        . colestipol (COLESTID) 1 G tablet 5 tablets once daily       .  doxazosin (CARDURA) 4 MG tablet TAKE ONE TABLET BY MOUTH EVERY DAY AT BEDTIME  90 tablet  1  . furosemide (LASIX) 20 MG tablet Take 20 mg by mouth daily.        . Glucose Blood (BAYER BREEZE 2 TEST) DISK 1 BID       . losartan-hydrochlorothiazide (HYZAAR) 100-25 MG per tablet TAKE ONE TABLET BY MOUTH EVERY DAY  90 tablet  1  . metoprolol succinate (TOPROL-XL) 25 MG 24 hr tablet 1/2 tab daily        Allergies  Allergen Reactions  . Benazepril Hcl     REACTION: Cough  . Caduet     REACTION: LEG CRAMPS  . Ezetimibe     REACTION: edema    Family History  Problem Relation Age of Onset  . Cancer Father     Prostate Cancer    BP 122/70  Pulse 56  Temp(Src) 97.9 F (36.6 C) (Oral)  Ht 5\' 8"  (1.727 m)  Wt 235 lb 6 oz (106.765 kg)  BMI 35.79 kg/m2  SpO2 98%   Review of Systems  Denies hearing loss, and visual loss Objective:   Vision:  Sees opthalmologist Hearing: grossly normal Body mass index:  See vs page Msk: pt easily and quickly performs "get-up-and-go" from a sitting position Cognitive Impairment Assessment: cognition, memory and judgment appear normal.  remembers 3/3 at 5 minutes.  excellent recall.  can easily read and write a sentence.  alert and oriented x 3   Assessment:   Medicare wellness utd on preventive parameters    Plan:   During the course of the visit the patient was educated and counseled about appropriate screening and preventive services including:       Fall prevention   Diabetes screening  Nutrition counseling   Vaccines / LABS Zostavax / Pnemonccoal Vaccine  today  PSA  Patient Instructions (the written plan) was given to the patient.

## 2011-05-08 ENCOUNTER — Ambulatory Visit (AMBULATORY_SURGERY_CENTER): Payer: Medicare Other | Admitting: *Deleted

## 2011-05-08 VITALS — Ht 68.0 in | Wt 234.9 lb

## 2011-05-08 DIAGNOSIS — Z1211 Encounter for screening for malignant neoplasm of colon: Secondary | ICD-10-CM

## 2011-05-08 MED ORDER — PEG-KCL-NACL-NASULF-NA ASC-C 100 G PO SOLR: 1.0000 | Freq: Once | ORAL | Status: DC

## 2011-05-09 ENCOUNTER — Other Ambulatory Visit: Payer: Self-pay | Admitting: Endocrinology

## 2011-05-15 ENCOUNTER — Other Ambulatory Visit: Payer: Self-pay | Admitting: *Deleted

## 2011-05-15 MED ORDER — COLESTIPOL HCL 1 G PO TABS: ORAL_TABLET | ORAL | Status: DC

## 2011-05-15 NOTE — Telephone Encounter (Signed)
R'cd fax from [facility] requesting refill for Colestid  Last OV-04/2011 Last filled-02/07/2011

## 2011-05-25 ENCOUNTER — Ambulatory Visit (AMBULATORY_SURGERY_CENTER): Payer: Medicare Other | Admitting: Gastroenterology

## 2011-05-25 ENCOUNTER — Other Ambulatory Visit: Payer: Self-pay | Admitting: Gastroenterology

## 2011-05-25 ENCOUNTER — Encounter: Payer: Self-pay | Admitting: Gastroenterology

## 2011-05-25 VITALS — HR 58 | Temp 96.7°F | Resp 22 | Ht 68.0 in | Wt 229.0 lb

## 2011-05-25 DIAGNOSIS — Z1211 Encounter for screening for malignant neoplasm of colon: Secondary | ICD-10-CM

## 2011-05-25 LAB — GLUCOSE, CAPILLARY
Glucose-Capillary: 104 mg/dL — ABNORMAL HIGH (ref 70–99)
Glucose-Capillary: 96 mg/dL (ref 70–99)
Glucose-Capillary: 65 mg/dL — ABNORMAL LOW (ref 70–99)
Glucose-Capillary: 84 mg/dL (ref 70–99)

## 2011-05-25 MED ORDER — SODIUM CHLORIDE 0.9 % IV SOLN: 500.0000 mL | INTRAVENOUS | Status: DC

## 2011-05-25 NOTE — Progress Notes (Signed)
Patient did not experience any of the following events: a burn prior to discharge; a fall within the facility; wrong site/side/patient/procedure/implant event; or a hospital transfer or hospital admission upon discharge from the facility. (G8907) Patient did not have preoperative order for IV antibiotic SSI prophylaxis. (G8918)  

## 2011-05-25 NOTE — Op Note (Addendum)
[facility] [address] [place], [place]  [address]  COLONOSCOPY PROCEDURE REPORT  PATIENT:  Anthony [name], Anthony [name]  MR#:  [id number] BIRTHDATE:  1936-04-16, 75 yrs. old  GENDER:  male ENDOSCOPIST:  Judie Petit T. Russella Dar, MD, [facility]  PROCEDURE DATE:  05/25/2011 PROCEDURE:  Average-risk screening colonoscopy A2130 ASA CLASS:  Class II INDICATIONS:  1) Routine Risk Screening MEDICATIONS:   MAC sedation, administered by CRNA, propofol (Diprivan) 150 mg IV DESCRIPTION OF PROCEDURE:   After the risks benefits and alternatives of the procedure were thoroughly explained, informed consent was obtained.  Digital rectal exam was performed and revealed no abnormalities.   The [id number] endoscope was introduced through the anus and advanced to the cecum, which was identified by both the appendix and ileocecal valve, without limitations.  The quality of the prep was good, using MoviPrep. The instrument was then slowly withdrawn as the colon was fully examined. <<PROCEDUREIMAGES>> FINDINGS:  Moderate diverticulosis was found in the sigmoid colon. Otherwise normal colonoscopy without other polyps, masses, vascular ectasias, or inflammatory changes.   Retroflexed views in the rectum revealed internal hemorrhoids, small.  The time to cecum =  2  minutes. The scope was then withdrawn (time =  10.5 min) from the patient and the procedure completed.  COMPLICATIONS:  None  ENDOSCOPIC IMPRESSION: 1) Moderate diverticulosis in the sigmoid colon 2) Internal hemorrhoids  RECOMMENDATIONS: 1) High fiber diet with liberal fluid intake. 2) Given you age, you will not need another colonoscopy for colon cancer screening. The types of tests usually stop around the age of 37.  Venita Lick. Russella Dar, MD, [facility]  n. REVISED:  05/25/2011 01:56 PM eSIGNED:   Venita Lick. Raequan Vanschaick at 05/25/2011 01:56 PM  Raelene Bott, [phone]

## 2011-05-26 ENCOUNTER — Telehealth: Payer: Self-pay | Admitting: *Deleted

## 2011-05-26 NOTE — Telephone Encounter (Signed)
Left message

## 2011-06-07 ENCOUNTER — Other Ambulatory Visit: Payer: Self-pay | Admitting: Endocrinology

## 2011-08-23 ENCOUNTER — Ambulatory Visit (INDEPENDENT_AMBULATORY_CARE_PROVIDER_SITE_OTHER): Payer: Medicare Other | Admitting: Endocrinology

## 2011-08-23 ENCOUNTER — Encounter: Payer: Self-pay | Admitting: Endocrinology

## 2011-08-23 ENCOUNTER — Other Ambulatory Visit (INDEPENDENT_AMBULATORY_CARE_PROVIDER_SITE_OTHER): Payer: Medicare Other

## 2011-08-23 VITALS — BP 126/72 | HR 61 | Temp 97.1°F | Ht 68.0 in | Wt 235.2 lb

## 2011-08-23 DIAGNOSIS — Z125 Encounter for screening for malignant neoplasm of prostate: Secondary | ICD-10-CM

## 2011-08-23 DIAGNOSIS — E119 Type 2 diabetes mellitus without complications: Secondary | ICD-10-CM

## 2011-08-23 LAB — PSA: PSA: 1.73 ng/mL (ref 0.10–4.00)

## 2011-08-23 MED ORDER — ZOSTER VACCINE LIVE 19400 UNT/0.65ML ~~LOC~~ SOLR: 0.6500 mL | Freq: Once | SUBCUTANEOUS | Status: AC

## 2011-08-23 NOTE — Progress Notes (Signed)
Subjective:    Patient ID: Anthony [name], male    DOB: 27-Sep-1935, 76 y.o.   MRN: [id number]  HPI Pt returns for f/u of type 2 DM (2002).  pt states he feels well in general.   Denies dizziness.  He feels no different on the reduced metoprolol.   Past Medical History  Diagnosis Date  . UROLITHIASIS, HX OF 03/15/2007  . DIABETES MELLITUS, TYPE II 03/15/2007  . NEPHROPATHY, DIABETIC 03/15/2007  . HYPERLIPIDEMIA 03/15/2007  . GOUT 03/15/2007  . HYPERTENSION 03/15/2007  . RENAL INSUFFICIENCY 04/30/2009  . ABNORMAL ELECTROCARDIOGRAM 05/03/2010  . SHOULDER PAIN, BILATERAL 04/29/2008  . GALLSTONES 03/15/2007    Past Surgical History  Procedure Date  . Nasal septum surgery 1978    History   Social History  . Marital Status: Divorced    Spouse Name: N/A    Number of Children: N/A  . Years of Education: N/A   Occupational History  . Retired    Social History Main Topics  . Smoking status: Never Smoker   . Smokeless tobacco: Not on file  . Alcohol Use: No  . Drug Use: No  . Sexually Active: Not on file   Other Topics Concern  . Not on file   Social History Narrative   NOK is Daughter Sharol Given    Current Outpatient Prescriptions on File Prior to Visit  Medication Sig Dispense Refill  . ACTOS 45 MG tablet TAKE ONE TABLET BY MOUTH EVERY DAY  90 each  1  . allopurinol (ZYLOPRIM) 300 MG tablet TAKE ONE TABLET BY MOUTH EVERY DAY  90 tablet  1  . aspirin 81 MG tablet Take 81 mg by mouth daily.        . colestipol (COLESTID) 1 G tablet 5 tablets once daily  450 tablet  3  . doxazosin (CARDURA) 4 MG tablet TAKE ONE TABLET BY MOUTH EVERY DAY AT BEDTIME  90 tablet  1  . furosemide (LASIX) 20 MG tablet TAKE ONE TABLET BY MOUTH EVERY DAY  90 tablet  1  . Glucose Blood (BAYER BREEZE 2 TEST) DISK 1 BID       . losartan-hydrochlorothiazide (HYZAAR) 100-25 MG per tablet TAKE ONE TABLET BY MOUTH EVERY DAY  90 tablet  1    Allergies  Allergen Reactions  . Benazepril Hcl    REACTION: Cough  . Caduet     REACTION: LEG CRAMPS  . Ezetimibe     REACTION: edema    Family History  Problem Relation Age of Onset  . Cancer Father     Prostate Cancer  . Colon cancer Neg Hx   . Esophageal cancer Neg Hx   . Stomach cancer Neg Hx     BP 126/72  Pulse 61  Temp(Src) 97.1 F (36.2 C) (Oral)  Ht 5\' 8"  (1.727 m)  Wt 235 lb 3.2 oz (106.686 kg)  BMI 35.76 kg/m2  SpO2 97%   Review of Systems He has lost a few lbs, due to his efforts.      Objective:   Physical Exam VITAL SIGNS:  See vs page GENERAL: no distress Pulses: dorsalis pedis intact bilat.   Feet: no deformity.  no ulcer on the feet.  feet are of normal color and temp.  1+ bilat leg edema Neuro: sensation is intact to touch on the feet    Lab Results  Component Value Date   HGBA1C 5.3 08/23/2011      Assessment & Plan:  Dm well-controlled HTN, overcontrolled

## 2011-08-23 NOTE — Patient Instructions (Addendum)
stop metoprolol. blood tests are being requested for you today.  please call [phone] to hear your test results.  You will be prompted to enter the 9-digit "MRN" number that appears at the top left of this page, followed by #.  Then you will hear the message. Please come back for a follow-up appointment in 6 months. (update: i left message on phone-tree:  rx as we discussed)

## 2011-09-26 ENCOUNTER — Other Ambulatory Visit: Payer: Self-pay | Admitting: Endocrinology

## 2011-11-15 ENCOUNTER — Telehealth: Payer: Self-pay | Admitting: *Deleted

## 2011-11-15 NOTE — Telephone Encounter (Signed)
Called pt to inform handicap placard renewal application is ready for pickup. Placed upfront in cabinet, ready for pickup.

## 2011-12-04 ENCOUNTER — Encounter (HOSPITAL_COMMUNITY): Payer: Self-pay

## 2011-12-04 ENCOUNTER — Emergency Department (INDEPENDENT_AMBULATORY_CARE_PROVIDER_SITE_OTHER)
Admission: EM | Admit: 2011-12-04 | Discharge: 2011-12-04 | Disposition: A | Discharge status: Home / Self Care | Payer: Medicare Other | Source: Home / Self Care | Attending: Emergency Medicine | Admitting: Emergency Medicine

## 2011-12-04 DIAGNOSIS — S80859A Superficial foreign body, unspecified lower leg, initial encounter: Secondary | ICD-10-CM

## 2011-12-04 DIAGNOSIS — S90569A Insect bite (nonvenomous), unspecified ankle, initial encounter: Secondary | ICD-10-CM

## 2011-12-04 DIAGNOSIS — W57XXXA Bitten or stung by nonvenomous insect and other nonvenomous arthropods, initial encounter: Secondary | ICD-10-CM

## 2011-12-04 MED ORDER — DOXYCYCLINE HYCLATE 100 MG PO CAPS: 100.0000 mg | ORAL_CAPSULE | Freq: Two times a day (BID) | ORAL | Status: AC

## 2011-12-04 NOTE — ED Provider Notes (Signed)
History     CSN: [id number]  Arrival date & time 12/04/11  1126   First MD Initiated Contact with Patient 12/04/11 1317      Chief Complaint  Patient presents with  . Foreign Body    (Consider location/radiation/quality/duration/timing/severity/associated sxs/prior treatment) HPI Comments: Patient presents to urgent care describing that for more than 6 months he has had this mole-looking structure on the inner aspect of his right tight, had mentioned this to 2 of his providers in the emergency department in the past he was diagnosed with having a mole that needed to be removed electively outpatient. He describes it initially was dark looking in no is white and larger.  After initial assessment was done it was obvious the patient had a embedded attached thick, with engorgement. Patient was asked about other symptoms, such as generalized malaise, fevers, headaches, body aches, rashes.  Patient is a 76 y.o. male presenting with foreign body. The history is provided by the patient.  Foreign Body  The current episode started more than 1 week ago. The incident was suspected. The incident was witnessed/reported by the patient. Pertinent negatives include no fever, no abdominal pain, no vomiting, no drainage, no drooling, no trouble swallowing and no cough. There were sick contacts at home.    Past Medical History  Diagnosis Date  . UROLITHIASIS, HX OF 03/15/2007  . DIABETES MELLITUS, TYPE II 03/15/2007  . NEPHROPATHY, DIABETIC 03/15/2007  . HYPERLIPIDEMIA 03/15/2007  . GOUT 03/15/2007  . HYPERTENSION 03/15/2007  . RENAL INSUFFICIENCY 04/30/2009  . ABNORMAL ELECTROCARDIOGRAM 05/03/2010  . SHOULDER PAIN, BILATERAL 04/29/2008  . GALLSTONES 03/15/2007    Past Surgical History  Procedure Date  . Nasal septum surgery 1978    Family History  Problem Relation Age of Onset  . Cancer Father     Prostate Cancer  . Colon cancer Neg Hx   . Esophageal cancer Neg Hx   . Stomach cancer Neg Hx      History  Substance Use Topics  . Smoking status: Never Smoker   . Smokeless tobacco: Not on file  . Alcohol Use: No      Review of Systems  Constitutional: Negative for fever, chills, activity change, appetite change and fatigue.  HENT: Negative for drooling and trouble swallowing.   Respiratory: Negative for cough.   Gastrointestinal: Negative for nausea, vomiting, abdominal pain and abdominal distention.  Musculoskeletal: Negative for myalgias, arthralgias and gait problem.  Skin: Positive for rash.  Neurological: Negative for headaches.    Allergies  Amlodipine-atorvastatin; Benazepril hcl; and Ezetimibe  Home Medications   Current Outpatient Rx  Name Route Sig Dispense Refill  . ACTOS 45 MG PO TABS  TAKE ONE TABLET BY MOUTH EVERY DAY 90 each 1  . ALLOPURINOL 300 MG PO TABS  TAKE ONE TABLET BY MOUTH EVERY DAY 90 tablet 1  . ASPIRIN 81 MG PO TABS Oral Take 81 mg by mouth daily.      . COLESTIPOL HCL 1 G PO TABS  5 tablets once daily 450 tablet 3  . DOXAZOSIN MESYLATE 4 MG PO TABS  TAKE ONE TABLET BY MOUTH EVERY DAY AT BEDTIME 90 tablet 2  . DOXYCYCLINE HYCLATE 100 MG PO CAPS Oral Take 1 capsule (100 mg total) by mouth 2 (two) times daily. 10 capsule 0  . FUROSEMIDE 20 MG PO TABS  TAKE ONE TABLET BY MOUTH EVERY DAY 90 tablet 1  . GLUCOSE BLOOD VI DISK  1 BID     . LOSARTAN POTASSIUM-HCTZ  100-25 MG PO TABS  TAKE ONE TABLET BY MOUTH EVERY DAY 90 tablet 2    BP 149/62  Pulse 64  Temp 98.3 F (36.8 C) (Oral)  Resp 18  SpO2 100%  Physical Exam  Nursing note and vitals reviewed. Constitutional: He appears well-developed.  Musculoskeletal: He exhibits tenderness.  Neurological: He is alert.  Skin: Rash noted. There is erythema.       ED Course  Procedures (including critical care time)  Labs Reviewed - No data to display No results found.   1. Embedded tick of lower leg       MDM  Tick embedded in right upper inner aspect of right thigh for  approximately 6 months. Patient denies any constitutional symptoms, suggestive of a tick borne illness after so many months. During animal procedure to remove this tick noted take that have blood contents. Decided to have patient take doxycycline for 5 days empirically patient understands rationale for treatment and felt comfortable in taking this antibiotic of motion potential side effects from it        Jimmie Molly, MD 12/04/11 1350

## 2011-12-04 NOTE — ED Notes (Signed)
Pt reports he has had a "mole on leg" for past 6-10 months, and wants to have it removed. "mole" observed to be attached to skin by tiny appendage which has observable thread like 3-4  attachments on either side of the mentioned FB. Pt c/o the site was reddened a while back. NAD

## 2011-12-04 NOTE — Discharge Instructions (Signed)
Take this medication for 5 days, I believe your risk of a tick borne illness is extremely low, this tick was engorged with blood. The area of induration as discussed last several months to [employer], flies, fleas, bedbugs, and many other insects can bite. Insect bites are different from insect stings. A sting is when venom is injected into the skin. Some insect bites can transmit infectious diseases. SYMPTOMS  Insect bites usually turn red, swell, and itch for 2 to 4 days. They often go away on their own. TREATMENT  Your caregiver may prescribe antibiotic medicines if a bacterial infection develops in the bite. HOME CARE INSTRUCTIONS  Do not scratch the bite area.   Keep the bite area clean and dry. Wash the bite area thoroughly with soap and water.   Put ice or cool compresses on the bite area.   Put ice in a plastic bag.   Place a towel between your skin and the bag.   Leave the ice on for 20 minutes, 4 times a day for the first 2 to 3 days, or as directed.   You may apply a baking soda paste, cortisone cream, or calamine lotion to the bite area as directed by your caregiver. This can help reduce itching and swelling.   Only take over-the-counter or prescription medicines as directed by your caregiver.   If you are given antibiotics, take them as directed. Finish them even if you start to feel better.  You may need a tetanus shot if:  You cannot remember when you had your last tetanus shot.   You have never had a tetanus shot.   The injury broke your skin.  If you get a tetanus shot, your arm may swell, get red, and feel warm to the touch. This is common and not a problem. If you need a tetanus shot and you choose not to have one, there is a rare chance of getting tetanus. Sickness from tetanus can be serious. SEEK IMMEDIATE MEDICAL CARE IF:   You have increased pain, redness, or swelling in the bite area.   You see a red line on the skin coming from the  bite.   You have a fever.   You have joint pain.   You have a headache or neck pain.   You have unusual weakness.   You have a rash.   You have chest pain or shortness of breath.   You have abdominal pain, nausea, or vomiting.   You feel unusually tired or sleepy.  MAKE SURE YOU:   Understand these instructions.   Will watch your condition.   Will get help right away if you are not doing well or get worse.  Document Released: 07/13/2004 Document Revised: 05/25/2011 Document Reviewed: 01/04/2011 [facility] Patient Information 2012 [place], [place].

## 2012-02-21 ENCOUNTER — Other Ambulatory Visit (INDEPENDENT_AMBULATORY_CARE_PROVIDER_SITE_OTHER): Payer: Medicare Other

## 2012-02-21 ENCOUNTER — Ambulatory Visit (INDEPENDENT_AMBULATORY_CARE_PROVIDER_SITE_OTHER): Payer: Medicare Other | Admitting: Endocrinology

## 2012-02-21 ENCOUNTER — Encounter: Payer: Self-pay | Admitting: Endocrinology

## 2012-02-21 VITALS — BP 138/78 | HR 61 | Temp 97.1°F | Ht 68.0 in | Wt 226.0 lb

## 2012-02-21 DIAGNOSIS — E119 Type 2 diabetes mellitus without complications: Secondary | ICD-10-CM

## 2012-02-21 LAB — HEMOGLOBIN A1C: Hgb A1c MFr Bld: 5.1 % (ref 4.6–6.5)

## 2012-02-21 MED ORDER — ALLOPURINOL 300 MG PO TABS: ORAL_TABLET | ORAL | Status: DC

## 2012-02-21 MED ORDER — PIOGLITAZONE HCL 45 MG PO TABS: ORAL_TABLET | ORAL | Status: DC

## 2012-02-21 MED ORDER — FUROSEMIDE 20 MG PO TABS: ORAL_TABLET | ORAL | Status: DC

## 2012-02-21 NOTE — Patient Instructions (Addendum)
blood tests are being requested for you today.  You will receive a letter with results.  Please come back for a "medicare wellness"  appointment in 4 months.

## 2012-02-21 NOTE — Progress Notes (Signed)
  Subjective:    Patient ID: Anthony [name], male    DOB: [birth year], 76 y.o.   MRN: [id number]  HPI Pt returns for f/u of type 2 DM (dx'ed 1993; complicated by renal insufficiency).  pt states she feels well in general.  no cbg record, but states cbg's are well-controlled Past Medical History  Diagnosis Date  . UROLITHIASIS, HX OF 03/15/2007  . DIABETES MELLITUS, TYPE II 03/15/2007  . NEPHROPATHY, DIABETIC 03/15/2007  . HYPERLIPIDEMIA 03/15/2007  . GOUT 03/15/2007  . HYPERTENSION 03/15/2007  . RENAL INSUFFICIENCY 04/30/2009  . ABNORMAL ELECTROCARDIOGRAM 05/03/2010  . SHOULDER PAIN, BILATERAL 04/29/2008  . GALLSTONES 03/15/2007    Past Surgical History  Procedure Date  . Nasal septum surgery 1978    History   Social History  . Marital Status: Divorced    Spouse Name: N/A    Number of Children: N/A  . Years of Education: N/A   Occupational History  . Retired    Social History Main Topics  . Smoking status: Never Smoker   . Smokeless tobacco: Not on file  . Alcohol Use: No  . Drug Use: No  . Sexually Active: Not on file   Other Topics Concern  . Not on file   Social History Narrative   NOK is Daughter Sharol Given    Current Outpatient Prescriptions on File Prior to Visit  Medication Sig Dispense Refill  . aspirin 81 MG tablet Take 81 mg by mouth daily.        . colestipol (COLESTID) 1 G tablet 5 tablets once daily  450 tablet  3  . doxazosin (CARDURA) 4 MG tablet TAKE ONE TABLET BY MOUTH EVERY DAY AT BEDTIME  90 tablet  2  . Glucose Blood (BAYER BREEZE 2 TEST) DISK 1 BID       . losartan-hydrochlorothiazide (HYZAAR) 100-25 MG per tablet TAKE ONE TABLET BY MOUTH EVERY DAY  90 tablet  2  . DISCONTD: ACTOS 45 MG tablet TAKE ONE TABLET BY MOUTH EVERY DAY  90 each  1  . DISCONTD: allopurinol (ZYLOPRIM) 300 MG tablet TAKE ONE TABLET BY MOUTH EVERY DAY  90 tablet  1  . DISCONTD: furosemide (LASIX) 20 MG tablet TAKE ONE TABLET BY MOUTH EVERY DAY  90 tablet  1     Allergies  Allergen Reactions  . Amlodipine-Atorvastatin     REACTION: LEG CRAMPS  . Benazepril Hcl     REACTION: Cough  . Ezetimibe     REACTION: edema    Family History  Problem Relation Age of Onset  . Cancer Father     Prostate Cancer  . Colon cancer Neg Hx   . Esophageal cancer Neg Hx   . Stomach cancer Neg Hx     BP 138/78  Pulse 61  Temp 97.1 F (36.2 C) (Oral)  Ht 5\' 8"  (1.727 m)  Wt 226 lb (102.513 kg)  BMI 34.36 kg/m2  SpO2 99%    Review of Systems denies hypoglycemia    Objective:   Physical Exam Pulses: dorsalis pedis intact bilat.   Feet: no deformity.  no ulcer on the feet.  feet are of normal color and temp.  no edema Neuro: sensation is intact to touch on the feet    Lab Results  Component Value Date   HGBA1C 5.1 02/21/2012      Assessment & Plan:  DM.  She can reduce actos

## 2012-07-03 ENCOUNTER — Telehealth: Payer: Self-pay | Admitting: Endocrinology

## 2012-07-03 MED ORDER — ASPIRIN 81 MG PO TABS: 81.0000 mg | ORAL_TABLET | Freq: Every day | ORAL | Status: AC

## 2012-07-03 MED ORDER — COLESTIPOL HCL 1 G PO TABS: ORAL_TABLET | ORAL | Status: DC

## 2012-07-03 MED ORDER — DOXAZOSIN MESYLATE 4 MG PO TABS: 4.0000 mg | ORAL_TABLET | ORAL | Status: DC

## 2012-07-03 MED ORDER — FUROSEMIDE 20 MG PO TABS: ORAL_TABLET | ORAL | Status: DC

## 2012-07-03 MED ORDER — LOSARTAN POTASSIUM-HCTZ 100-25 MG PO TABS: 1.0000 | ORAL_TABLET | Freq: Every day | ORAL | Status: DC

## 2012-07-03 MED ORDER — ALLOPURINOL 300 MG PO TABS: ORAL_TABLET | ORAL | Status: DC

## 2012-07-03 MED ORDER — PIOGLITAZONE HCL 45 MG PO TABS: ORAL_TABLET | ORAL | Status: DC

## 2012-07-03 NOTE — Telephone Encounter (Signed)
The patient came into the office to ask that all of his medications be refilled and called into the [facility] at [facility].  The patient requests that these prescriptions be for 90 day supplies.  The patient may be reached at [phone] if needed.

## 2012-09-02 ENCOUNTER — Other Ambulatory Visit: Payer: Self-pay | Admitting: Endocrinology

## 2012-09-02 ENCOUNTER — Ambulatory Visit (INDEPENDENT_AMBULATORY_CARE_PROVIDER_SITE_OTHER): Payer: Medicare Other | Admitting: Endocrinology

## 2012-09-02 ENCOUNTER — Encounter: Payer: Self-pay | Admitting: Endocrinology

## 2012-09-02 VITALS — BP 134/74 | HR 54 | Wt 213.0 lb

## 2012-09-02 DIAGNOSIS — R609 Edema, unspecified: Secondary | ICD-10-CM

## 2012-09-02 DIAGNOSIS — E119 Type 2 diabetes mellitus without complications: Secondary | ICD-10-CM

## 2012-09-02 DIAGNOSIS — D649 Anemia, unspecified: Secondary | ICD-10-CM

## 2012-09-02 DIAGNOSIS — M109 Gout, unspecified: Secondary | ICD-10-CM

## 2012-09-02 DIAGNOSIS — Z0000 Encounter for general adult medical examination without abnormal findings: Secondary | ICD-10-CM

## 2012-09-02 DIAGNOSIS — N259 Disorder resulting from impaired renal tubular function, unspecified: Secondary | ICD-10-CM

## 2012-09-02 DIAGNOSIS — I10 Essential (primary) hypertension: Secondary | ICD-10-CM

## 2012-09-02 DIAGNOSIS — E785 Hyperlipidemia, unspecified: Secondary | ICD-10-CM

## 2012-09-02 DIAGNOSIS — Z125 Encounter for screening for malignant neoplasm of prostate: Secondary | ICD-10-CM

## 2012-09-02 LAB — LIPID PANEL
Cholesterol: 162 mg/dL (ref 0–200)
HDL: 45.3 mg/dL (ref >39.00)
VLDL: 11 mg/dL (ref 0.0–40.0)

## 2012-09-02 LAB — BASIC METABOLIC PANEL
BUN: 12 mg/dL (ref 6–23)
CO2: 29 mEq/L (ref 19–32)
Chloride: 101 mEq/L (ref 96–112)
Creatinine, Ser: 1.4 mg/dL (ref 0.4–1.5)
Glucose, Bld: 81 mg/dL (ref 70–99)

## 2012-09-02 LAB — URIC ACID: Uric Acid, Serum: 7.1 mg/dL (ref 4.0–7.8)

## 2012-09-02 LAB — URINALYSIS, ROUTINE W REFLEX MICROSCOPIC
Ketones, ur: NEGATIVE
Specific Gravity, Urine: 1.02 (ref 1.000–1.030)
Urine Glucose: NEGATIVE
Urobilinogen, UA: 1 (ref 0.0–1.0)
pH: 6.5 (ref 5.0–8.0)

## 2012-09-02 LAB — CBC WITH DIFFERENTIAL/PLATELET
Basophils Absolute: 0 10*3/uL (ref 0.0–0.1)
Basophils Relative: 0.5 % (ref 0.0–3.0)
Eosinophils Absolute: 0.1 10*3/uL (ref 0.0–0.7)
MCHC: 31.9 g/dL (ref 30.0–36.0)
MCV: 82.4 fl (ref 78.0–100.0)
Monocytes Absolute: 0.4 10*3/uL (ref 0.1–1.0)
Neutrophils Relative %: 72.7 % (ref 43.0–77.0)
RBC: 4.96 Mil/uL (ref 4.22–5.81)
RDW: 15.2 % — ABNORMAL HIGH (ref 11.5–14.6)

## 2012-09-02 LAB — IBC PANEL: Transferrin: 190.5 mg/dL — ABNORMAL LOW (ref 212.0–360.0)

## 2012-09-02 LAB — HEPATIC FUNCTION PANEL
Albumin: 3.8 g/dL (ref 3.5–5.2)
Total Protein: 7 g/dL (ref 6.0–8.3)

## 2012-09-02 NOTE — Patient Instructions (Addendum)
please consider these measures for your health:  minimize alcohol.  do not use tobacco products.  have a colonoscopy at least every 10 years from age 77.   keep firearms safely stored.  always use seat belts.  have working smoke alarms in your home.  see an eye doctor and dentist regularly.  never drive under the influence of alcohol or drugs (including prescription drugs).   please let me know what your wishes would be, if artificial life support measures should become necessary.  it is critically important to prevent falling down (keep floor areas well-lit, dry, and free of loose objects.  If you have a cane, walker, or wheelchair, you should use it, even for short trips around the house.  Also, try not to rush) Please return in 1 year.  

## 2012-09-02 NOTE — Progress Notes (Signed)
Subjective:    Patient ID: Anthony [name], male    DOB: 03/18/1936, 77 y.o.   MRN: [id number]  HPI Pt returns for f/u of type 2 DM (dx'ed 1993; complicated by renal insufficiency).  pt states she feels well in general.  no cbg record, but states cbg's are well-controlled.   Past Medical History  Diagnosis Date  . UROLITHIASIS, HX OF 03/15/2007  . DIABETES MELLITUS, TYPE II 03/15/2007  . NEPHROPATHY, DIABETIC 03/15/2007  . HYPERLIPIDEMIA 03/15/2007  . GOUT 03/15/2007  . HYPERTENSION 03/15/2007  . RENAL INSUFFICIENCY 04/30/2009  . ABNORMAL ELECTROCARDIOGRAM 05/03/2010  . SHOULDER PAIN, BILATERAL 04/29/2008  . GALLSTONES 03/15/2007    Past Surgical History  Procedure Laterality Date  . Nasal septum surgery  1978    History   Social History  . Marital Status: Divorced    Spouse Name: N/A    Number of Children: N/A  . Years of Education: N/A   Occupational History  . Retired    Social History Main Topics  . Smoking status: Never Smoker   . Smokeless tobacco: Not on file  . Alcohol Use: No  . Drug Use: No  . Sexually Active: Not on file   Other Topics Concern  . Not on file   Social History Narrative   NOK is Daughter Sharol Given          Current Outpatient Prescriptions on File Prior to Visit  Medication Sig Dispense Refill  . allopurinol (ZYLOPRIM) 300 MG tablet TAKE ONE TABLET BY MOUTH EVERY DAY  90 tablet  2  . aspirin 81 MG tablet Take 1 tablet (81 mg total) by mouth daily.  90 tablet  2  . colestipol (COLESTID) 1 G tablet 5 tablets once daily  450 tablet  3  . doxazosin (CARDURA) 4 MG tablet Take 1 tablet (4 mg total) by mouth as directed.  90 tablet  2  . furosemide (LASIX) 20 MG tablet TAKE ONE TABLET BY MOUTH EVERY DAY  90 tablet  2  . Glucose Blood (BAYER BREEZE 2 TEST) DISK 1 BID       . losartan-hydrochlorothiazide (HYZAAR) 100-25 MG per tablet Take 1 tablet by mouth daily.  90 tablet  2   No current facility-administered medications on file prior  to visit.    Allergies  Allergen Reactions  . Amlodipine-Atorvastatin     REACTION: LEG CRAMPS  . Benazepril Hcl     REACTION: Cough  . Ezetimibe     REACTION: edema    Family History  Problem Relation Age of Onset  . Cancer Father     Prostate Cancer  . Colon cancer Neg Hx   . Esophageal cancer Neg Hx   . Stomach cancer Neg Hx     BP 134/74  Pulse 54  Wt 213 lb (96.616 kg)  BMI 32.39 kg/m2  SpO2 96%    Review of Systems  Constitutional: Negative for fever and unexpected weight change.  HENT: Negative for hearing loss.   Eyes: Negative for visual disturbance.  Respiratory: Negative for shortness of breath.   Cardiovascular: Negative for chest pain.  Gastrointestinal: Negative for anal bleeding.  Endocrine: Positive for cold intolerance.  Genitourinary: Negative for hematuria and difficulty urinating.  Musculoskeletal: Negative for back pain.  Skin: Negative for rash.  Allergic/Immunologic: Negative for environmental allergies.  Neurological: Negative for syncope.  Psychiatric/Behavioral: Negative for dysphoric mood.       Objective:   Physical Exam VS: see vs page GEN: no distress  HEAD: head: no deformity eyes: no periorbital swelling, no proptosis external nose and ears are normal mouth: no lesion seen NECK: supple, thyroid is not enlarged CHEST WALL: no deformity LUNGS: clear to auscultation BREASTS:  No gynecomastia CV: reg rate and rhythm, no murmur ABD: abdomen is soft, nontender.  no hepatosplenomegaly.  not distended.  no hernia  RECTAL: normal external and internal exam.  heme neg. PROSTATE:  Normal size.  No nodule MUSCULOSKELETAL: muscle bulk and strength are grossly normal.  no obvious joint swelling.  gait is normal and steady PULSES: dorsalis pedis intact bilat.  no carotid bruit NEURO:  cn 2-12 grossly intact.   readily moves all 4's.  sensation is intact to touch on the feet SKIN:  Normal texture and temperature.  No rash or suspicious  lesion is visible.   NODES:  None palpable at the neck PSYCH: alert, oriented x3.  Does not appear anxious nor depressed.  (ecg machine is broken)    Assessment & Plan:  we discussed code status.  pt requests full code, but would not want to be started or maintained on artificial life-support measures if there was not a reasonable chance of recovery    SEPARATE EVALUATION FOLLOWS--EACH PROBLEM HERE IS NEW, NOT RESPONDING TO TREATMENT, OR POSES SIGNIFICANT RISK TO THE PATIENT'S HEALTH: HISTORY OF THE PRESENT ILLNESS: Pt states few years of slight varicosities of the legs, and assoc swelling.  PAST MEDICAL HISTORY reviewed and up to date today REVIEW OF SYSTEMS: Denies numbness. PHYSICAL EXAMINATION: VITAL SIGNS:  See vs page GENERAL: no distress EXTEMITIES: no deformity.  no ulcer on the feet.  feet are of normal color and temp.  Trace bilat leg edema LAB/XRAY RESULTS: Lab Results  Component Value Date   HGBA1C 5.0 09/02/2012  IMPRESSION: Edema, due to actos and varicosities PLAN: reduce actos to 1/2 of 45 mg qod

## 2012-09-03 LAB — PTH, INTACT AND CALCIUM

## 2012-09-05 LAB — PTH, INTACT AND CALCIUM: Calcium, Total (PTH): 9.5 mg/dL (ref 8.4–10.5)

## 2012-09-05 NOTE — Progress Notes (Signed)
  Subjective:    Patient ID: Anthony [name], male    DOB: 02-17-1936, 77 y.o.   MRN: [id number]  HPI here for regular wellness examination.  He's feeling pretty well in general, and says chronic med probs are stable, except as noted below.    Review of Systems     Objective:   Physical Exam        Assessment & Plan:  Wellness visit today, with problems stable, except as noted.

## 2013-05-26 ENCOUNTER — Ambulatory Visit (INDEPENDENT_AMBULATORY_CARE_PROVIDER_SITE_OTHER): Payer: Medicare Other | Admitting: Endocrinology

## 2013-05-26 VITALS — BP 130/98 | HR 79 | Temp 97.5°F | Ht 68.0 in | Wt 197.2 lb

## 2013-05-26 DIAGNOSIS — Z0000 Encounter for general adult medical examination without abnormal findings: Secondary | ICD-10-CM

## 2013-05-27 ENCOUNTER — Telehealth: Payer: Self-pay | Admitting: Endocrinology

## 2013-05-27 NOTE — Telephone Encounter (Signed)
please call patient: Please come back in the next few days, for an appointment.  No need to be annual--this was done earlier this year.

## 2013-05-27 NOTE — Progress Notes (Signed)
   Subjective:    Patient ID: Anthony [name], male    DOB: 1935/07/04, 77 y.o.   MRN: [id number]  HPI (left without being seen)   Review of Systems     Objective:   Physical Exam        Assessment & Plan:

## 2013-06-03 ENCOUNTER — Ambulatory Visit (INDEPENDENT_AMBULATORY_CARE_PROVIDER_SITE_OTHER): Payer: Medicare Other

## 2013-06-03 DIAGNOSIS — Z23 Encounter for immunization: Secondary | ICD-10-CM

## 2013-06-09 NOTE — Telephone Encounter (Signed)
please call patient: please again encourage him to make an appointment.

## 2013-06-09 NOTE — Telephone Encounter (Signed)
Tried pt no answer will try again at a later time.

## 2013-06-25 ENCOUNTER — Encounter: Payer: Self-pay | Admitting: Endocrinology

## 2013-06-25 ENCOUNTER — Ambulatory Visit (INDEPENDENT_AMBULATORY_CARE_PROVIDER_SITE_OTHER): Payer: Medicare Other | Admitting: Endocrinology

## 2013-06-25 VITALS — BP 130/92 | HR 75 | Temp 98.5°F | Ht 68.0 in | Wt 193.0 lb

## 2013-06-25 DIAGNOSIS — R9431 Abnormal electrocardiogram [ECG] [EKG]: Secondary | ICD-10-CM

## 2013-06-25 NOTE — Progress Notes (Signed)
   Subjective:    Patient ID: Anthony [name], male    DOB: 1936-06-17, 78 y.o.   MRN: [id number]  HPI Moderately abnormal ecg was noted in December of 2014.  Pt says he has no h/o heart dz.  Denies chest pain, or assoc sob. Past Medical History  Diagnosis Date  . UROLITHIASIS, HX OF 03/15/2007  . DIABETES MELLITUS, TYPE II 03/15/2007  . NEPHROPATHY, DIABETIC 03/15/2007  . HYPERLIPIDEMIA 03/15/2007  . GOUT 03/15/2007  . HYPERTENSION 03/15/2007  . RENAL INSUFFICIENCY 04/30/2009  . ABNORMAL ELECTROCARDIOGRAM 05/03/2010  . SHOULDER PAIN, BILATERAL 04/29/2008  . GALLSTONES 03/15/2007    Past Surgical History  Procedure Laterality Date  . Nasal septum surgery  1978    History   Social History  . Marital Status: Divorced    Spouse Name: N/A    Number of Children: N/A  . Years of Education: N/A   Occupational History  . Retired    Social History Main Topics  . Smoking status: Never Smoker   . Smokeless tobacco: Not on file  . Alcohol Use: No  . Drug Use: No  . Sexual Activity: Not on file   Other Topics Concern  . Not on file   Social History Narrative   NOK is Daughter [name]          Current Outpatient Prescriptions on File Prior to Visit  Medication Sig Dispense Refill  . allopurinol (ZYLOPRIM) 300 MG tablet TAKE ONE TABLET BY MOUTH EVERY DAY  90 tablet  2  . aspirin 81 MG tablet Take 1 tablet (81 mg total) by mouth daily.  90 tablet  2  . colestipol (COLESTID) 1 G tablet 5 tablets once daily  450 tablet  3  . doxazosin (CARDURA) 4 MG tablet Take 1 tablet (4 mg total) by mouth as directed.  90 tablet  2  . furosemide (LASIX) 20 MG tablet TAKE ONE TABLET BY MOUTH EVERY DAY  90 tablet  2  . Glucose Blood (BAYER BREEZE 2 TEST) DISK 1 BID       . losartan-hydrochlorothiazide (HYZAAR) 100-25 MG per tablet Take 1 tablet by mouth daily.  90 tablet  2  . pioglitazone (ACTOS) 45 MG tablet TAKE ONE-HALF TABLET BY MOUTH EVERY OTHER DAY       No current  facility-administered medications on file prior to visit.    Allergies  Allergen Reactions  . Amlodipine-Atorvastatin     REACTION: LEG CRAMPS  . Benazepril Hcl     REACTION: Cough  . Ezetimibe     REACTION: edema    Family History  Problem Relation Age of Onset  . Cancer Father     Prostate Cancer  . Colon cancer Neg Hx   . Esophageal cancer Neg Hx   . Stomach cancer Neg Hx     BP 130/92  Pulse 75  Temp(Src) 98.5 F (36.9 C) (Oral)  Ht 5\' 8"  (1.727 m)  Wt 193 lb (87.544 kg)  BMI 29.35 kg/m2  SpO2 97%  Review of Systems Denies LOC and palpitations    Objective:   Physical Exam VITAL SIGNS:  See vs page GENERAL: no distress LUNGS:  Clear to auscultation HEART:  Regular rate and rhythm without murmurs noted. Normal S1,S2.     i reviewed electrocardiogram (i also discussed with dr cooper).    Assessment & Plan:  Abnormal ecg, persistent, uncertain etiology

## 2013-06-25 NOTE — Patient Instructions (Addendum)
Let's check  "treadmill" test.  you will receive a phone call, about a day and time for an appointment. Please come in for a regular physical after 09/02/13.

## 2013-06-27 ENCOUNTER — Encounter: Payer: Self-pay | Admitting: Cardiology

## 2013-07-01 ENCOUNTER — Encounter: Payer: Self-pay | Admitting: Cardiovascular Disease

## 2013-07-01 ENCOUNTER — Ambulatory Visit (HOSPITAL_COMMUNITY): Payer: Medicare Other | Attending: Cardiovascular Disease | Admitting: Radiology

## 2013-07-01 VITALS — BP 204/117 | HR 62 | Ht 68.0 in | Wt 194.0 lb

## 2013-07-01 DIAGNOSIS — R9431 Abnormal electrocardiogram [ECG] [EKG]: Secondary | ICD-10-CM

## 2013-07-01 DIAGNOSIS — E785 Hyperlipidemia, unspecified: Secondary | ICD-10-CM | POA: Insufficient documentation

## 2013-07-01 DIAGNOSIS — E119 Type 2 diabetes mellitus without complications: Secondary | ICD-10-CM

## 2013-07-01 DIAGNOSIS — I10 Essential (primary) hypertension: Secondary | ICD-10-CM | POA: Insufficient documentation

## 2013-07-01 MED ORDER — TECHNETIUM TC 99M SESTAMIBI GENERIC - CARDIOLITE
10.0000 | Freq: Once | INTRAVENOUS | Status: AC | PRN
  Administered 2013-07-01: 10 via INTRAVENOUS

## 2013-07-01 MED ORDER — REGADENOSON 0.4 MG/5ML IV SOLN
0.4000 mg | Freq: Once | INTRAVENOUS | Status: AC
  Administered 2013-07-01: 0.4 mg via INTRAVENOUS

## 2013-07-01 MED ORDER — TECHNETIUM TC 99M SESTAMIBI GENERIC - CARDIOLITE
30.0000 | Freq: Once | INTRAVENOUS | Status: AC | PRN
  Administered 2013-07-01: 30 via INTRAVENOUS

## 2013-07-01 NOTE — Progress Notes (Signed)
  [facility] SITE 3 NUCLEAR MED [address] [place], [place] [address] [phone]    Cardiology Nuclear Med Study  Anthony [name] is a 78 y.o. male     MRN : [id number]     DOB: 1936/01/25  Procedure Date: 07/01/2013  Nuclear Med Background Indication for Stress Test:  Evaluation for Ischemia and Abnormal EKG History:  No known CAD, MPI 2005 (normal) EF 69% Cardiac Risk Factors: Hypertension, Lipids and NIDDM  Symptoms:  None indicated   Nuclear Pre-Procedure Caffeine/Decaff Intake:  None NPO After: 7:00pm   Lungs:  clear O2 Sat: 98% on room air. IV 0.9% NS with Angio Cath:  20g  IV Site: R Hand  IV Started by:  Cathlyn Parsonsynthia Hasspacher, RN  Chest Size (in):  42 Cup Size: n/a  Height: 5\' 8"  (1.727 m)  Weight:  194 lb (87.998 kg)  BMI:  Body mass index is 29.5 kg/(m^2). Tech Comments:  n/a    Nuclear Med Study 1 or 2 day study: 1 day  Stress Test Type:  Lexiscan  Reading MD: n/a  Order Authorizing Provider:  Lorna FewSean Ellison,MD  Resting Radionuclide: Technetium 920m Sestamibi  Resting Radionuclide Dose: 11.0 mCi   Stress Radionuclide:  Technetium 6420m Sestamibi  Stress Radionuclide Dose: 33.0 mCi           Stress Protocol Rest HR: 62 Stress HR: 93  Rest BP: 204/117 Stress BP: 195/107  Exercise Time (min): n/a METS: n/a           Dose of Adenosine (mg):  n/a Dose of Lexiscan: 0.4 mg  Dose of Atropine (mg): n/a Dose of Dobutamine: n/a mcg/kg/min (at max HR)  Stress Test Technologist: Nelson ChimesSharon Brooks, BS-ES  Nuclear Technologist:  Doyne Keelonya Yount, CNMT     Rest Procedure:  Myocardial perfusion imaging was performed at rest 45 minutes following the intravenous administration of Technetium 3220m Sestamibi. Rest ECG: Sinus rhythm, septal MI, inferolateral T wave changes.  Stress Procedure:  The patient received IV Lexiscan 0.4 mg over 15-seconds.  Technetium 6320m Sestamibi injected at 30-seconds.  Quantitative spect images were obtained after a 45 minute delay. Stress  ECG: No significant ST segment change suggestive of ischemia.  QPS Raw Data Images:  Acquisition technically good; LVE. Stress Images:  There is decreased uptake in the distal anteroseptal wall and apex. Rest Images:  There is decreased uptake in the distal anteroseptal wall and apex. Subtraction (SDS):  No evidence of ischemia. Transient Ischemic Dilatation (Normal <1.22):  0.90 Lung/Heart Ratio (Normal <0.45):  0.31  Quantitative Gated Spect Images QGS EDV:  136 ml QGS ESV:  80 ml  Impression Exercise Capacity:  Lexiscan with no exercise. BP Response:  Normal blood pressure response. Clinical Symptoms:  No chest pain or dyspnea. ECG Impression:  No significant ST segment change suggestive of ischemia. Comparison with Prior Nuclear Study: Since December 03, 2003, infarct is new.  Overall Impression:  Intermediate risk stress nuclear study with a moderate size, severe intensity, fixed distal anteroseptal and apical defect consistent with prior infarct; no significant ischemia.  LV Ejection Fraction: 41%.  LV Wall Motion:  Apical akinesis.  Olga MillersBrian Orien Mayhall

## 2013-08-13 ENCOUNTER — Emergency Department (INDEPENDENT_AMBULATORY_CARE_PROVIDER_SITE_OTHER): Payer: Medicare Other

## 2013-08-13 ENCOUNTER — Encounter (HOSPITAL_COMMUNITY): Payer: Self-pay | Admitting: Emergency Medicine

## 2013-08-13 ENCOUNTER — Emergency Department (INDEPENDENT_AMBULATORY_CARE_PROVIDER_SITE_OTHER)
Admission: EM | Admit: 2013-08-13 | Discharge: 2013-08-13 | Disposition: A | Discharge status: Home / Self Care | Payer: Medicare Other | Source: Home / Self Care | Attending: Family Medicine | Admitting: Family Medicine

## 2013-08-13 DIAGNOSIS — S8010XA Contusion of unspecified lower leg, initial encounter: Secondary | ICD-10-CM

## 2013-08-13 DIAGNOSIS — S8012XA Contusion of left lower leg, initial encounter: Secondary | ICD-10-CM
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
## 2013-08-13 NOTE — Discharge Instructions (Signed)
Contusion °A contusion is a deep bruise. Contusions are the result of an injury that caused bleeding under the skin. The contusion may turn blue, purple, or yellow. Minor injuries will give you a painless contusion, but more severe contusions may stay painful and swollen for a few weeks.  °CAUSES  °A contusion is usually caused by a blow, trauma, or direct force to an area of the body. °SYMPTOMS  °· Swelling and redness of the injured area. °· Bruising of the injured area. °· Tenderness and soreness of the injured area. °· Pain. °DIAGNOSIS  °The diagnosis can be made by taking a history and physical exam. An X-ray, CT scan, or MRI may be needed to determine if there were any associated injuries, such as fractures. °TREATMENT  °Specific treatment will depend on what area of the body was injured. In general, the best treatment for a contusion is resting, icing, elevating, and applying cold compresses to the injured area. Over-the-counter medicines may also be recommended for pain control. Ask your caregiver what the best treatment is for your contusion. °HOME CARE INSTRUCTIONS  °· Put ice on the injured area. °· Put ice in a plastic bag. °· Place a towel between your skin and the bag. °· Leave the ice on for 15-20 minutes, 03-04 times a day. °· Only take over-the-counter or prescription medicines for pain, discomfort, or fever as directed by your caregiver. Your caregiver may recommend avoiding anti-inflammatory medicines (aspirin, ibuprofen, and naproxen) for 48 hours because these medicines may increase bruising. °· Rest the injured area. °· If possible, elevate the injured area to reduce swelling. °SEEK IMMEDIATE MEDICAL CARE IF:  °· You have increased bruising or swelling. °· You have pain that is getting worse. °· Your swelling or pain is not relieved with medicines. °MAKE SURE YOU:  °· Understand these instructions. °· Will watch your condition. °· Will get help right away if you are not doing well or get  worse. °Document Released: 03/15/2005 Document Revised: 08/28/2011 Document Reviewed: 04/10/2011 °ExitCare® Patient Information ©2014 [employer]. °Motor Vehicle Collision  °It is common to have multiple bruises and sore muscles after a motor vehicle collision (MVC). These tend to feel worse for the first 24 hours. You may have the most stiffness and soreness over the first several hours. You may also feel worse when you wake up the first morning after your collision. After this point, you will usually begin to improve with each day. The speed of improvement often depends on the severity of the collision, the number of injuries, and the location and nature of these injuries. °HOME CARE INSTRUCTIONS  °· Put ice on the injured area. °· Put ice in a plastic bag. °· Place a towel between your skin and the bag. °· Leave the ice on for 15-20 minutes, 03-04 times a day. °· Drink enough fluids to keep your urine clear or pale yellow. Do not drink alcohol. °· Take a warm shower or bath once or twice a day. This will increase blood flow to sore muscles. °· You may return to activities as directed by your caregiver. Be careful when lifting, as this may aggravate neck or back pain. °· Only take over-the-counter or prescription medicines for pain, discomfort, or fever as directed by your caregiver. Do not use aspirin. This may increase bruising and bleeding. °SEEK IMMEDIATE MEDICAL CARE IF: °· You have numbness, tingling, or weakness in the arms or legs. °· You develop severe headaches not relieved with medicine. °· You have   severe neck pain, especially tenderness in the middle of the back of your neck. °· You have changes in bowel or bladder control. °· There is increasing pain in any area of the body. °· You have shortness of breath, lightheadedness, dizziness, or fainting. °· You have chest pain. °· You feel sick to your stomach (nauseous), throw up (vomit), or sweat. °· You have increasing abdominal discomfort. °· There is  blood in your urine, stool, or vomit. °· You have pain in your shoulder (shoulder strap areas). °· You feel your symptoms are getting worse. °MAKE SURE YOU:  °· Understand these instructions. °· Will watch your condition. °· Will get help right away if you are not doing well or get worse. °Document Released: 06/05/2005 Document Revised: 08/28/2011 Document Reviewed: 11/02/2010 °ExitCare® Patient Information ©2014 [employer]. ° °

## 2013-08-15 NOTE — ED Provider Notes (Signed)
Medical screening examination/treatment/procedure(s) were performed by a resident physician or non-physician practitioner and as the supervising physician I was immediately available for consultation/collaboration.  Burnice Vassel, MD    Avarie Tavano S Cambrie Sonnenfeld, MD 08/15/13 0742 

## 2013-08-18 ENCOUNTER — Telehealth: Payer: Self-pay | Admitting: Endocrinology

## 2013-08-18 ENCOUNTER — Other Ambulatory Visit: Payer: Self-pay | Admitting: Endocrinology

## 2013-08-18 MED ORDER — GLUCOSE BLOOD VI DISK: DISK | Status: DC

## 2013-08-18 MED ORDER — FUROSEMIDE 20 MG PO TABS: ORAL_TABLET | ORAL | Status: DC

## 2013-08-18 MED ORDER — DOXAZOSIN MESYLATE 4 MG PO TABS: 4.0000 mg | ORAL_TABLET | ORAL | Status: DC

## 2013-08-18 MED ORDER — PIOGLITAZONE HCL 45 MG PO TABS: ORAL_TABLET | ORAL | Status: DC

## 2013-08-18 MED ORDER — LOSARTAN POTASSIUM-HCTZ 100-25 MG PO TABS: 1.0000 | ORAL_TABLET | Freq: Every day | ORAL | Status: DC

## 2013-08-18 MED ORDER — COLESTIPOL HCL 1 G PO TABS: ORAL_TABLET | ORAL | Status: DC

## 2013-08-18 NOTE — Telephone Encounter (Signed)
Pt's daughter needs to speak with nurse regarding her fathers medication  She states her father has not been taking his meds and they are expired  Please call asap   Call back: [phone]  Thank You

## 2013-08-18 NOTE — Telephone Encounter (Signed)
Spoke with daughter medications refilled.

## 2013-08-19 NOTE — Telephone Encounter (Signed)
Spoke with pharmacy medications being filled.

## 2013-09-02 ENCOUNTER — Encounter: Payer: Self-pay | Admitting: Endocrinology

## 2013-09-02 ENCOUNTER — Ambulatory Visit (INDEPENDENT_AMBULATORY_CARE_PROVIDER_SITE_OTHER): Payer: Medicare Other | Admitting: Endocrinology

## 2013-09-02 ENCOUNTER — Other Ambulatory Visit: Payer: Self-pay | Admitting: Endocrinology

## 2013-09-02 VITALS — BP 130/70 | HR 102 | Temp 98.4°F | Ht 69.0 in | Wt 191.0 lb

## 2013-09-02 DIAGNOSIS — E785 Hyperlipidemia, unspecified: Secondary | ICD-10-CM

## 2013-09-02 DIAGNOSIS — Z79899 Other long term (current) drug therapy: Secondary | ICD-10-CM

## 2013-09-02 DIAGNOSIS — D649 Anemia, unspecified: Secondary | ICD-10-CM

## 2013-09-02 DIAGNOSIS — I10 Essential (primary) hypertension: Secondary | ICD-10-CM

## 2013-09-02 DIAGNOSIS — Z87442 Personal history of urinary calculi: Secondary | ICD-10-CM

## 2013-09-02 DIAGNOSIS — N259 Disorder resulting from impaired renal tubular function, unspecified: Secondary | ICD-10-CM

## 2013-09-02 DIAGNOSIS — D696 Thrombocytopenia, unspecified: Secondary | ICD-10-CM

## 2013-09-02 DIAGNOSIS — R413 Other amnesia: Secondary | ICD-10-CM | POA: Insufficient documentation

## 2013-09-02 DIAGNOSIS — M109 Gout, unspecified: Secondary | ICD-10-CM

## 2013-09-02 DIAGNOSIS — Z125 Encounter for screening for malignant neoplasm of prostate: Secondary | ICD-10-CM

## 2013-09-02 DIAGNOSIS — E119 Type 2 diabetes mellitus without complications: Secondary | ICD-10-CM

## 2013-09-02 LAB — LIPID PANEL
Cholesterol: 146 mg/dL (ref 0–200)
HDL: 43 mg/dL
LDL Cholesterol: 76 mg/dL (ref 0–99)
Total CHOL/HDL Ratio: 3.4 ratio
Triglycerides: 136 mg/dL
VLDL: 27 mg/dL (ref 0–40)

## 2013-09-02 LAB — BASIC METABOLIC PANEL WITH GFR
BUN: 25 mg/dL — ABNORMAL HIGH (ref 6–23)
CO2: 32 meq/L (ref 19–32)
Calcium: 9.3 mg/dL (ref 8.4–10.5)
Chloride: 104 meq/L (ref 96–112)
Creat: 1.72 mg/dL — ABNORMAL HIGH (ref 0.50–1.35)
Glucose, Bld: 114 mg/dL — ABNORMAL HIGH (ref 70–99)
Potassium: 4.2 meq/L (ref 3.5–5.3)
Sodium: 140 meq/L (ref 135–145)

## 2013-09-02 LAB — CBC WITH DIFFERENTIAL/PLATELET
Basophils Absolute: 0.1 K/uL (ref 0.0–0.1)
Basophils Relative: 1 % (ref 0–1)
Eosinophils Absolute: 0.1 K/uL (ref 0.0–0.7)
Eosinophils Relative: 1 % (ref 0–5)
HCT: 40.9 % (ref 39.0–52.0)
Hemoglobin: 13 g/dL (ref 13.0–17.0)
Lymphocytes Relative: 28 % (ref 12–46)
Lymphs Abs: 1.9 K/uL (ref 0.7–4.0)
MCH: 24.9 pg — ABNORMAL LOW (ref 26.0–34.0)
MCHC: 31.8 g/dL (ref 30.0–36.0)
MCV: 78.2 fL (ref 78.0–100.0)
Monocytes Absolute: 0.3 K/uL (ref 0.1–1.0)
Monocytes Relative: 4 % (ref 3–12)
Neutro Abs: 4.4 K/uL (ref 1.7–7.7)
Neutrophils Relative %: 66 % (ref 43–77)
Platelets: 175 K/uL (ref 150–400)
RBC: 5.23 MIL/uL (ref 4.22–5.81)
RDW: 14.9 % (ref 11.5–15.5)
WBC: 6.7 K/uL (ref 4.0–10.5)

## 2013-09-02 LAB — HEPATIC FUNCTION PANEL
ALT: 9 U/L (ref 0–53)
AST: 15 U/L (ref 0–37)
Albumin: 3.7 g/dL (ref 3.5–5.2)
Alkaline Phosphatase: 82 U/L (ref 39–117)
Bilirubin, Direct: 0.2 mg/dL (ref 0.0–0.3)
Indirect Bilirubin: 0.5 mg/dL (ref 0.2–1.2)
Total Bilirubin: 0.7 mg/dL (ref 0.2–1.2)
Total Protein: 6.7 g/dL (ref 6.0–8.3)

## 2013-09-02 LAB — IRON AND TIBC
%SAT: 38 % (ref 20–55)
Iron: 85 ug/dL (ref 42–165)
TIBC: 222 ug/dL (ref 215–435)
UIBC: 137 ug/dL (ref 125–400)

## 2013-09-02 LAB — URIC ACID: Uric Acid, Serum: 3.4 mg/dL — ABNORMAL LOW (ref 4.0–7.8)

## 2013-09-02 MED ORDER — DONEPEZIL HCL 5 MG PO TABS: 5.0000 mg | ORAL_TABLET | Freq: Every day | ORAL | Status: DC

## 2013-09-02 NOTE — Patient Instructions (Addendum)
blood tests are being requested for you today.  We'll contact you with results.   Please come back for a regular physical appointment in 2 weeks.  i have sent a prescription to your pharmacy, for the memory.
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
## 2013-09-03 LAB — URINALYSIS, ROUTINE W REFLEX MICROSCOPIC
Bilirubin Urine: NEGATIVE
Glucose, UA: NEGATIVE mg/dL
Hgb urine dipstick: NEGATIVE
Ketones, ur: NEGATIVE mg/dL
LEUKOCYTES UA: NEGATIVE
NITRITE: NEGATIVE
PROTEIN: 100 mg/dL — AB
SPECIFIC GRAVITY, URINE: 1.015 (ref 1.005–1.030)
Urobilinogen, UA: 0.2 mg/dL (ref 0.0–1.0)
pH: 7 (ref 5.0–8.0)

## 2013-09-03 LAB — PTH, INTACT AND CALCIUM
CALCIUM: 9.3 mg/dL (ref 8.4–10.5)
PTH: 78.1 pg/mL — AB (ref 14.0–72.0)

## 2013-09-03 LAB — MICROALBUMIN / CREATININE URINE RATIO
CREATININE, URINE: 180.6 mg/dL
MICROALB/CREAT RATIO: 422.4 mg/g — AB (ref 0.0–30.0)
Microalb, Ur: 76.29 mg/dL — ABNORMAL HIGH (ref 0.00–1.89)

## 2013-09-03 LAB — URINALYSIS, MICROSCOPIC ONLY
Bacteria, UA: NONE SEEN
Casts: NONE SEEN
Crystals: NONE SEEN
SQUAMOUS EPITHELIAL / LPF: NONE SEEN

## 2013-09-03 LAB — HEMOGLOBIN A1C
Hgb A1c MFr Bld: 5.6 % (ref <5.7)
MEAN PLASMA GLUCOSE: 114 mg/dL (ref <117)

## 2013-09-03 LAB — VITAMIN B12: Vitamin B-12: 432 pg/mL (ref 211–911)

## 2013-09-03 LAB — PSA: PSA: 1.89 ng/mL (ref <=4.00)

## 2013-09-03 LAB — TSH: TSH: 1.348 u[IU]/mL (ref 0.350–4.500)

## 2013-09-16 ENCOUNTER — Ambulatory Visit (INDEPENDENT_AMBULATORY_CARE_PROVIDER_SITE_OTHER): Payer: Medicare Other | Admitting: Endocrinology

## 2013-09-16 ENCOUNTER — Encounter: Payer: Self-pay | Admitting: Endocrinology

## 2013-09-16 VITALS — BP 150/90 | HR 71 | Temp 98.2°F | Ht 69.0 in | Wt 195.0 lb

## 2013-09-16 DIAGNOSIS — M25569 Pain in unspecified knee: Secondary | ICD-10-CM

## 2013-09-16 MED ORDER — DONEPEZIL HCL 10 MG PO TABS: 10.0000 mg | ORAL_TABLET | Freq: Every day | ORAL | Status: DC
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
## 2013-09-16 NOTE — Patient Instructions (Addendum)
blood tests are being requested for you today.  We'll contact you with results.   Please come back for a regular physical appointment in 3 weeks.  i have sent a prescription to your pharmacy, to double the memory pill.   Refer to a [facility].  you will receive a phone call, about a day and time for an appointment. Please stop taking the pioglitizone pill.

## 2013-09-17 ENCOUNTER — Encounter: Payer: Self-pay | Admitting: Endocrinology

## 2013-10-16 ENCOUNTER — Ambulatory Visit (INDEPENDENT_AMBULATORY_CARE_PROVIDER_SITE_OTHER): Payer: Medicare Other | Admitting: Endocrinology

## 2013-10-16 ENCOUNTER — Encounter: Payer: Self-pay | Admitting: Endocrinology

## 2013-10-16 ENCOUNTER — Ambulatory Visit
Admission: RE | Admit: 2013-10-16 | Discharge: 2013-10-16 | Disposition: A | Discharge status: Home / Self Care | Payer: Medicare Other | Source: Ambulatory Visit | Attending: Endocrinology | Admitting: Endocrinology

## 2013-10-16 VITALS — BP 112/78 | HR 64 | Temp 98.1°F | Ht 69.0 in | Wt 191.0 lb

## 2013-10-16 DIAGNOSIS — Z23 Encounter for immunization: Secondary | ICD-10-CM

## 2013-10-16 DIAGNOSIS — R0689 Other abnormalities of breathing: Secondary | ICD-10-CM | POA: Insufficient documentation

## 2013-10-16 DIAGNOSIS — R0989 Other specified symptoms and signs involving the circulatory and respiratory systems: Secondary | ICD-10-CM

## 2013-10-16 MED ORDER — MEMANTINE HCL 5 MG PO TABS: 5.0000 mg | ORAL_TABLET | Freq: Two times a day (BID) | ORAL | Status: DC

## 2013-10-16 NOTE — Progress Notes (Signed)
Subjective:    Patient ID: Anthony [name], male    DOB: 21-Jul-1935, 78 y.o.   MRN: [id number]  HPI Pt is here for regular wellness examination, and is feeling pretty well in general, and says chronic med probs are stable, except as noted below Past Medical History  Diagnosis Date  . UROLITHIASIS, HX OF 03/15/2007  . DIABETES MELLITUS, TYPE II 03/15/2007  . NEPHROPATHY, DIABETIC 03/15/2007  . HYPERLIPIDEMIA 03/15/2007  . GOUT 03/15/2007  . HYPERTENSION 03/15/2007  . RENAL INSUFFICIENCY 04/30/2009  . ABNORMAL ELECTROCARDIOGRAM 05/03/2010  . SHOULDER PAIN, BILATERAL 04/29/2008  . GALLSTONES 03/15/2007    Past Surgical History  Procedure Laterality Date  . Nasal septum surgery  1978    History   Social History  . Marital Status: Divorced    Spouse Name: N/A    Number of Children: N/A  . Years of Education: N/A   Occupational History  . Retired    Social History Main Topics  . Smoking status: Never Smoker   . Smokeless tobacco: Not on file  . Alcohol Use: No  . Drug Use: No  . Sexual Activity: Not on file   Other Topics Concern  . Not on file   Social History Narrative   NOK is Daughter Sharol GivenAngelia Zechman          Current Outpatient Prescriptions on File Prior to Visit  Medication Sig Dispense Refill  . allopurinol (ZYLOPRIM) 300 MG tablet TAKE ONE TABLET BY MOUTH EVERY DAY  90 tablet  0  . aspirin 81 MG tablet Take 1 tablet (81 mg total) by mouth daily.  90 tablet  2  . colestipol (COLESTID) 1 G tablet 5 tablets once daily  450 tablet  0  . donepezil (ARICEPT) 10 MG tablet Take 1 tablet (10 mg total) by mouth at bedtime.  90 tablet  3  . doxazosin (CARDURA) 4 MG tablet Take 1 tablet (4 mg total) by mouth as directed.  90 tablet  0  . furosemide (LASIX) 20 MG tablet TAKE ONE TABLET BY MOUTH EVERY DAY  90 tablet  0  . Glucose Blood (BAYER BREEZE 2 TEST) DISK Use twice daily to check Blood sugars  200 each  0  . losartan-hydrochlorothiazide (HYZAAR) 100-25 MG per  tablet Take 1 tablet by mouth daily.  90 tablet  0   No current facility-administered medications on file prior to visit.    Allergies  Allergen Reactions  . Amlodipine-Atorvastatin     REACTION: LEG CRAMPS  . Benazepril Hcl     REACTION: Cough  . Ezetimibe     REACTION: edema    Family History  Problem Relation Age of Onset  . Cancer Father     Prostate Cancer  . Colon cancer Neg Hx   . Esophageal cancer Neg Hx   . Stomach cancer Neg Hx     BP 112/78  Pulse 64  Temp(Src) 98.1 F (36.7 C) (Oral)  Ht 5\' 9"  (1.753 m)  Wt 191 lb (86.637 kg)  BMI 28.19 kg/m2  SpO2 99%  Review of Systems  Constitutional: Negative for fever.  HENT: Negative for hearing loss.   Eyes: Negative for visual disturbance.  Respiratory: Negative for shortness of breath.   Cardiovascular: Negative for chest pain.  Gastrointestinal: Negative for anal bleeding.  Endocrine: Positive for cold intolerance.  Genitourinary: Negative for hematuria and difficulty urinating.  Musculoskeletal: Negative for back pain.  Skin: Negative for rash.  Allergic/Immunologic: Negative for environmental allergies.  Neurological:  Negative for syncope.  Hematological: Does not bruise/bleed easily.  Psychiatric/Behavioral: Negative for dysphoric mood.       Objective:   Physical Exam VS: see vs page GEN: no distress HEAD: head: no deformity eyes: no periorbital swelling, no proptosis external nose and ears are normal mouth: no lesion seen NECK: supple, thyroid is not enlarged CHEST WALL: no deformity BREASTS:  No gynecomastia CV: reg rate and rhythm, no murmur ABD: abdomen is soft, nontender.  no hepatosplenomegaly.  not distended.  no hernia RECTAL: normal external and internal exam.  heme neg. PROSTATE:  Normal size.  No nodule MUSCULOSKELETAL: muscle bulk and strength are grossly normal.  no obvious joint swelling.  gait is normal and steady PULSES:  no carotid bruit NEURO:  cn 2-12 grossly intact.    readily moves all 4's.   SKIN:  Normal texture and temperature.  No rash or suspicious lesion is visible.   NODES:  None palpable at the neck PSYCH: alert.  Does not appear anxious nor depressed.       Assessment & Plan:  Wellness visit today, with problems stable, except as noted.     SEPARATE EVALUATION FOLLOWS--EACH PROBLEM HERE IS NEW, NOT RESPONDING TO TREATMENT, OR POSES SIGNIFICANT RISK TO THE PATIENT'S HEALTH: HISTORY OF THE PRESENT ILLNESS: Memory loss (dx'ed 2014) is slightly improved on aricept.  Edema is much better off the actos.   Rales are noted today, at the lung bases.  Denies associated cough PAST MEDICAL HISTORY reviewed and up to date today REVIEW OF SYSTEMS: Denies headache and weight change PHYSICAL EXAMINATION: VITAL SIGNS:  See vs page GENERAL: no distress LUNGS:  Clear to auscultation, except for rales at the bases LAB/XRAY RESULTS: CXR: NAD IMPRESSION: Rales, new, uncertain etiology Dementia: he needs increased rx Edema: improved off actos. PLAN: See instruction page

## 2013-10-16 NOTE — Patient Instructions (Addendum)
i have sent a prescription to your pharmacy, for an additional pill for the memory. Please come back for a follow-up appointment in 1 month. Please check a chest x-ray downstairs today, on you way out.   please consider these measures for your health:  minimize alcohol.  do not use tobacco products.  have a colonoscopy at least every 10 years from age 78.  keep firearms safely stored.  always use seat belts.  have working smoke alarms in your home.  see an eye doctor and dentist regularly.  never drive under the influence of alcohol or drugs (including prescription drugs).   please let me know what your wishes would be, if artificial life support measures should become necessary.  it is critically important to prevent falling down (keep floor areas well-lit, dry, and free of loose objects.  If you have a cane, walker, or wheelchair, you should use it, even for short trips around the house.  Also, try not to rush). You should have a vaccine against shingles (a painful rash which results from the  chickenpox infection which most people had many years ago).  This vaccine reduces, but does not totally eliminate the risk of shingles.  Because this is a medicare part d benefit, you should get it at a pharmacy.

## 2013-11-12 ENCOUNTER — Other Ambulatory Visit: Payer: Self-pay | Admitting: Endocrinology

## 2013-11-13 NOTE — Telephone Encounter (Signed)
Please advise if ok to refill. Medication is not on current med list. Thanks!  

## 2013-11-14 ENCOUNTER — Encounter: Payer: Self-pay | Admitting: Endocrinology

## 2013-11-14 ENCOUNTER — Ambulatory Visit (INDEPENDENT_AMBULATORY_CARE_PROVIDER_SITE_OTHER): Payer: Medicare Other | Admitting: Endocrinology

## 2013-11-14 VITALS — BP 120/82 | HR 66 | Temp 98.2°F | Ht 69.0 in | Wt 189.0 lb

## 2013-11-14 DIAGNOSIS — R413 Other amnesia: Secondary | ICD-10-CM

## 2013-11-14 DIAGNOSIS — N259 Disorder resulting from impaired renal tubular function, unspecified: Secondary | ICD-10-CM

## 2013-11-14 LAB — BASIC METABOLIC PANEL
BUN: 22 mg/dL (ref 6–23)
CHLORIDE: 104 meq/L (ref 96–112)
CO2: 30 meq/L (ref 19–32)
Calcium: 9.1 mg/dL (ref 8.4–10.5)
Creatinine, Ser: 1.8 mg/dL — ABNORMAL HIGH (ref 0.4–1.5)
GFR: 46.54 mL/min — ABNORMAL LOW (ref >60.00)
GLUCOSE: 80 mg/dL (ref 70–99)
Potassium: 3.8 mEq/L (ref 3.5–5.1)
Sodium: 140 mEq/L (ref 135–145)
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
## 2013-11-14 NOTE — Patient Instructions (Addendum)
blood tests are being requested for you today.  We'll contact you with results.   The next step is to check a ct or MRI of the brain (depending on your kidney results today).   Please come back for a follow-up appointment in 3 months.

## 2013-12-08 ENCOUNTER — Other Ambulatory Visit: Payer: Self-pay | Admitting: Internal Medicine

## 2014-01-02 ENCOUNTER — Ambulatory Visit
Admission: RE | Admit: 2014-01-02 | Discharge: 2014-01-02 | Disposition: A | Discharge status: Home / Self Care | Payer: Medicare Other | Source: Ambulatory Visit | Attending: Endocrinology | Admitting: Endocrinology

## 2014-01-02 DIAGNOSIS — R413 Other amnesia: Secondary | ICD-10-CM

## 2014-02-08 ENCOUNTER — Other Ambulatory Visit: Payer: Self-pay | Admitting: Endocrinology

## 2014-02-27 ENCOUNTER — Other Ambulatory Visit: Payer: Self-pay | Admitting: Endocrinology

## 2014-05-08 ENCOUNTER — Ambulatory Visit (INDEPENDENT_AMBULATORY_CARE_PROVIDER_SITE_OTHER): Payer: Medicare Other | Admitting: Endocrinology

## 2014-05-08 ENCOUNTER — Encounter: Payer: Self-pay | Admitting: Endocrinology

## 2014-05-08 VITALS — BP 140/94 | HR 101 | Temp 98.5°F | Ht 69.0 in | Wt 188.0 lb

## 2014-05-08 DIAGNOSIS — M109 Gout, unspecified: Secondary | ICD-10-CM

## 2014-05-08 DIAGNOSIS — R739 Hyperglycemia, unspecified: Secondary | ICD-10-CM

## 2014-05-08 DIAGNOSIS — Z0000 Encounter for general adult medical examination without abnormal findings: Secondary | ICD-10-CM

## 2014-05-08 DIAGNOSIS — N259 Disorder resulting from impaired renal tubular function, unspecified: Secondary | ICD-10-CM

## 2014-05-08 LAB — BASIC METABOLIC PANEL
BUN: 26 mg/dL — ABNORMAL HIGH (ref 6–23)
CO2: 30 meq/L (ref 19–32)
CREATININE: 1.9 mg/dL — AB (ref 0.4–1.5)
Calcium: 9.1 mg/dL (ref 8.4–10.5)
Chloride: 106 mEq/L (ref 96–112)
GFR: 43.72 mL/min — ABNORMAL LOW (ref >60.00)
Glucose, Bld: 96 mg/dL (ref 70–99)
Potassium: 4 mEq/L (ref 3.5–5.1)
Sodium: 141 mEq/L (ref 135–145)

## 2014-05-08 MED ORDER — MEMANTINE HCL 10 MG PO TABS: 10.0000 mg | ORAL_TABLET | Freq: Two times a day (BID) | ORAL | Status: DC

## 2014-05-08 NOTE — Progress Notes (Signed)
we discussed code status.  pt requests full code, but would not want to be started or maintained on artificial life-support measures if there was not a reasonable chance of recovery 
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
## 2014-05-08 NOTE — Patient Instructions (Addendum)
Please continue the same blood pressure medication for now.   please consider these measures for your health:  minimize alcohol.  do not use tobacco products.  have a colonoscopy at least every 10 years from age 78.   keep firearms safely stored.  always use seat belts.  have working smoke alarms in your home.  see an eye doctor and dentist regularly.  never drive under the influence of alcohol or drugs (including prescription drugs).   Please come back for a regular physical appointment in 6 months.   good diet and exercise habits significanly improve the control of your diabetes.  please let me know if you wish to be referred to a dietician.  high blood sugar is very risky to your health.  you should see an eye doctor and dentist every year.  It is very important to get all recommended vaccinations.  it is critically important to prevent falling down (keep floor areas well-lit, dry, and free of loose objects.  If you have a cane, walker, or wheelchair, you should use it, even for short trips around the house.  Also, try not to rush).   Please increase the Namenda to 10 mg, twice a day. i have sent a prescription to your pharmacy.  You should stop driving, as those with memory problems have more accidents.

## 2014-05-12 ENCOUNTER — Other Ambulatory Visit: Payer: Self-pay | Admitting: Endocrinology

## 2014-05-19 ENCOUNTER — Other Ambulatory Visit: Payer: Self-pay | Admitting: Endocrinology

## 2014-06-01 ENCOUNTER — Other Ambulatory Visit: Payer: Self-pay | Admitting: Endocrinology

## 2014-06-23 ENCOUNTER — Telehealth: Payer: Self-pay | Admitting: Neurology

## 2014-08-13 ENCOUNTER — Other Ambulatory Visit: Payer: Self-pay | Admitting: Endocrinology

## 2014-08-17 ENCOUNTER — Other Ambulatory Visit: Payer: Self-pay | Admitting: Endocrinology

## 2014-09-02 ENCOUNTER — Other Ambulatory Visit: Payer: Self-pay

## 2014-09-02 ENCOUNTER — Encounter: Payer: Self-pay | Admitting: Endocrinology

## 2014-09-02 ENCOUNTER — Ambulatory Visit (INDEPENDENT_AMBULATORY_CARE_PROVIDER_SITE_OTHER): Payer: Medicare Other | Admitting: Endocrinology

## 2014-09-02 VITALS — BP 128/90 | HR 97 | Temp 98.2°F | Ht 69.0 in | Wt 177.0 lb

## 2014-09-02 DIAGNOSIS — Z125 Encounter for screening for malignant neoplasm of prostate: Secondary | ICD-10-CM

## 2014-09-02 DIAGNOSIS — R413 Other amnesia: Secondary | ICD-10-CM | POA: Diagnosis not present

## 2014-09-02 DIAGNOSIS — Z0189 Encounter for other specified special examinations: Secondary | ICD-10-CM | POA: Diagnosis not present

## 2014-09-02 DIAGNOSIS — E785 Hyperlipidemia, unspecified: Secondary | ICD-10-CM | POA: Diagnosis not present

## 2014-09-02 DIAGNOSIS — Z0000 Encounter for general adult medical examination without abnormal findings: Secondary | ICD-10-CM

## 2014-09-02 LAB — URINALYSIS, ROUTINE W REFLEX MICROSCOPIC
Bilirubin Urine: NEGATIVE
Hgb urine dipstick: NEGATIVE
KETONES UR: NEGATIVE
LEUKOCYTES UA: NEGATIVE
NITRITE: NEGATIVE
PH: 6 (ref 5.0–8.0)
RBC / HPF: NONE SEEN (ref 0–?)
SPECIFIC GRAVITY, URINE: 1.015 (ref 1.000–1.030)
Urine Glucose: NEGATIVE
Urobilinogen, UA: 0.2 (ref 0.0–1.0)

## 2014-09-02 LAB — CBC WITH DIFFERENTIAL/PLATELET
Basophils Absolute: 0 K/uL (ref 0.0–0.1)
Basophils Relative: 0.3 % (ref 0.0–3.0)
Eosinophils Absolute: 0.1 K/uL (ref 0.0–0.7)
Eosinophils Relative: 0.7 % (ref 0.0–5.0)
HCT: 40.9 % (ref 39.0–52.0)
Hemoglobin: 13.2 g/dL (ref 13.0–17.0)
Lymphocytes Relative: 16.6 % (ref 12.0–46.0)
Lymphs Abs: 1.4 K/uL (ref 0.7–4.0)
MCHC: 32.3 g/dL (ref 30.0–36.0)
MCV: 80.8 fl (ref 78.0–100.0)
Monocytes Absolute: 0.4 K/uL (ref 0.1–1.0)
Monocytes Relative: 4.6 % (ref 3.0–12.0)
Neutro Abs: 6.4 K/uL (ref 1.4–7.7)
Neutrophils Relative %: 77.8 % — ABNORMAL HIGH (ref 43.0–77.0)
Platelets: 141 K/uL — ABNORMAL LOW (ref 150.0–400.0)
RBC: 5.06 Mil/uL (ref 4.22–5.81)
RDW: 16.6 % — ABNORMAL HIGH (ref 11.5–15.5)
WBC: 8.3 K/uL (ref 4.0–10.5)

## 2014-09-02 LAB — BASIC METABOLIC PANEL
BUN: 28 mg/dL — ABNORMAL HIGH (ref 6–23)
CALCIUM: 9.2 mg/dL (ref 8.4–10.5)
CO2: 31 mEq/L (ref 19–32)
Chloride: 105 mEq/L (ref 96–112)
Creatinine, Ser: 1.85 mg/dL — ABNORMAL HIGH (ref 0.40–1.50)
GFR: 45.87 mL/min — ABNORMAL LOW (ref >60.00)
Glucose, Bld: 103 mg/dL — ABNORMAL HIGH (ref 70–99)
POTASSIUM: 3.6 meq/L (ref 3.5–5.1)
SODIUM: 139 meq/L (ref 135–145)

## 2014-09-02 LAB — LIPID PANEL
CHOLESTEROL: 123 mg/dL (ref 0–200)
HDL: 43.4 mg/dL (ref >39.00)
LDL Cholesterol: 64 mg/dL (ref 0–99)
NONHDL: 79.6
Total CHOL/HDL Ratio: 3
Triglycerides: 78 mg/dL (ref 0.0–149.0)
VLDL: 15.6 mg/dL (ref 0.0–40.0)

## 2014-09-02 LAB — HEPATIC FUNCTION PANEL
ALBUMIN: 3.7 g/dL (ref 3.5–5.2)
ALK PHOS: 76 U/L (ref 39–117)
ALT: 11 U/L (ref 0–53)
AST: 16 U/L (ref 0–37)
BILIRUBIN DIRECT: 0.2 mg/dL (ref 0.0–0.3)
TOTAL PROTEIN: 6.9 g/dL (ref 6.0–8.3)
Total Bilirubin: 0.8 mg/dL (ref 0.2–1.2)

## 2014-09-02 LAB — VITAMIN B12: Vitamin B-12: 397 pg/mL (ref 211–911)

## 2014-09-02 LAB — TSH: TSH: 0.96 u[IU]/mL (ref 0.35–4.50)

## 2014-09-02 LAB — PSA: PSA: 2.19 ng/mL (ref 0.10–4.00)

## 2014-09-02 MED ORDER — MEMANTINE HCL 10 MG PO TABS: 10.0000 mg | ORAL_TABLET | Freq: Two times a day (BID) | ORAL | Status: DC

## 2014-09-02 NOTE — Progress Notes (Signed)
Subjective:    Patient ID: Anthony [name], male    DOB: [birth year], 79 y.o.   MRN: [id number]  HPI Memory loss: pt says his memory is pretty good.  He says he takes meds as rx'ed.  History is from patient and dtr (Anthony [name]).  She says she [place] y police in [place], [place] 3 weeks ago.  Pt was disoriented, standing in the middle of the street.  Patient had apparently driven himself there.  She says pt's memory is good today, but has been poor recently.  Pt lives alone, but 1 of 2 dtrs are almost always with him.  They make sure pt gets his meds, and that he no longer has access to a car.  Past Medical History  Diagnosis Date  . UROLITHIASIS, HX OF 03/15/2007  . DIABETES MELLITUS, TYPE II 03/15/2007  . NEPHROPATHY, DIABETIC 03/15/2007  . HYPERLIPIDEMIA 03/15/2007  . GOUT 03/15/2007  . HYPERTENSION 03/15/2007  . RENAL INSUFFICIENCY 04/30/2009  . ABNORMAL ELECTROCARDIOGRAM 05/03/2010  . SHOULDER PAIN, BILATERAL 04/29/2008  . GALLSTONES 03/15/2007    Past Surgical History  Procedure Laterality Date  . Nasal septum surgery  1978    History   Social History  . Marital Status: Divorced    Spouse Name: N/A  . Number of Children: N/A  . Years of Education: N/A   Occupational History  . Retired    Social History Main Topics  . Smoking status: Never Smoker   . Smokeless tobacco: Not on file  . Alcohol Use: No  . Drug Use: No  . Sexual Activity: Not on file   Other Topics Concern  . Not on file   Social History Narrative   NOK is Daughter Anthony [name]          Current Outpatient Prescriptions on File Prior to Visit  Medication Sig Dispense Refill  . allopurinol (ZYLOPRIM) 300 MG tablet TAKE ONE TABLET BY MOUTH ONCE DAILY. 90 tablet 0  . aspirin 81 MG tablet Take 1 tablet (81 mg total) by mouth daily. 90 tablet 2  . donepezil (ARICEPT) 10 MG tablet Take 1 tablet (10 mg total) by mouth at bedtime. 90 tablet 3  . doxazosin (CARDURA) 4 MG tablet TAKE ONE TABLET BY MOUTH  ONCE DAILY AS DIRECTED 90 tablet 0  . furosemide (LASIX) 20 MG tablet TAKE ONE TABLET BY MOUTH ONCE DAILY. 90 tablet 0  . Glucose Blood (BAYER BREEZE 2 TEST) DISK Use twice daily to check Blood sugars 200 each 0  . losartan-hydrochlorothiazide (HYZAAR) 100-25 MG per tablet TAKE ONE TABLET BY MOUTH ONCE DAILY. 90 tablet 0  . MICRONIZED COLESTIPOL HCL 1 G tablet TAKE FIVE TABLETS BY MOUTH ONCE DAILY. 450 tablet 0   No current facility-administered medications on file prior to visit.    Allergies  Allergen Reactions  . Amlodipine-Atorvastatin     REACTION: LEG CRAMPS  . Benazepril Hcl     REACTION: Cough  . Ezetimibe     REACTION: edema    Family History  Problem Relation Age of Onset  . Cancer Father     Prostate Cancer  . Colon cancer Neg Hx   . Esophageal cancer Neg Hx   . Stomach cancer Neg Hx     BP 128/90 mmHg  Pulse 97  Temp(Src) 98.2 F (36.8 C) (Oral)  Ht 5\' 9"  (1.753 m)  Wt 177 lb (80.287 kg)  BMI 26.13 kg/m2  SpO2 92%  Review of Systems Denies headache.  He  has lost weight    Objective:   Physical Exam VITAL SIGNS:  See vs page GENERAL: no distress Orientated to self and [place]. He says it is April, 2016.   Lab Results  Component Value Date   WBC 8.3 09/02/2014   HGB 13.2 09/02/2014   HCT 40.9 09/02/2014   PLT 141.0* 09/02/2014   GLUCOSE 103* 09/02/2014   CHOL 123 09/02/2014   TRIG 78.0 09/02/2014   HDL 43.40 09/02/2014   LDLDIRECT 136.7 04/30/2009   LDLCALC 64 09/02/2014   ALT 11 09/02/2014   AST 16 09/02/2014   NA 139 09/02/2014   K 3.6 09/02/2014   CL 105 09/02/2014   CREATININE 1.85* 09/02/2014   BUN 28* 09/02/2014   CO2 31 09/02/2014   TSH 0.96 09/02/2014   PSA 2.19 09/02/2014   INR 0.9 09/18/2007   HGBA1C 5.6 09/02/2013   MICROALBUR 76.29* 09/02/2013      Assessment & Plan:  Weight loss, uncertain etiology, but usually due to dementia.  We'll follow Dementia: worse Thrombocytopenia: new, uncertain etiology.  We'll  follow. Renal failure: stable.  Patient is advised the following: Patient Instructions  blood tests are being requested for you today.  We'll let you know about the results.  Please continue the same medications.  Please see a specialist.  you will receive a phone call, about a day and time for an appointment.   You will also get a phone call, about someone visiting you about the safety of your living situation.

## 2014-09-02 NOTE — Patient Instructions (Addendum)
blood tests are being requested for you today.  We'll let you know about the results.  Please continue the same medications.  Please see a specialist.  you will receive a phone call, about a day and time for an appointment.   You will also get a phone call, about someone visiting you about the safety of your living situation.

## 2014-09-04 ENCOUNTER — Telehealth: Payer: Self-pay | Admitting: Endocrinology

## 2014-09-04 ENCOUNTER — Other Ambulatory Visit: Payer: Self-pay

## 2014-09-04 MED ORDER — DOXAZOSIN MESYLATE 4 MG PO TABS: ORAL_TABLET | ORAL | Status: DC

## 2014-09-04 NOTE — Telephone Encounter (Signed)
please call patient's dtr: Despite the cost, please consider getting the vaccines.

## 2014-09-04 NOTE — Telephone Encounter (Signed)
Patient had office visit on 3.16.16  following that appointment took patient to get shingles and pneumonia vaccine  The cost was going to be 289.00  Crystal would like to know if patient needs these vaccines?  Please advise    Thank you

## 2014-09-04 NOTE — Telephone Encounter (Signed)
Pls advise.  

## 2014-09-07 ENCOUNTER — Other Ambulatory Visit: Payer: Self-pay

## 2014-09-07 NOTE — Telephone Encounter (Signed)
Wanted to verify. Shingles shot was documented as given on 06/20/2011. Prev 13 was documented in 2015 and the pneumonia 23 documented in 2006.  Is the pt in need of these vaccines? Thanks!

## 2014-09-07 NOTE — Patient Outreach (Signed)
Inbound call from primary MD, Dr. George HughEllison's office contact:  Anthony ManlyMegan E Tuck, LPN who verified that Dr. Everardo AllEllison verified patient is up to date on Shingles and Pneumonia Vaccines.  No further renewal of vaccine is needed at this time.    RN CM  Contacted patient's caregiver, daughter, Anthony [name] and instructed no Shingles or Pneumonia renewal vaccines are necessary.   Per medical record with Dr. Everardo AllEllison, patient is up to date on both vaccines.  Anthony [name] verbalized understanding of this update.    Anthony Schultzrystal Jafeth Mustin, RN, BSN, [facility], CCM  [facility] Care Management Care Management Coordinator [phone] Office [phone] Direct [phone] Cell

## 2014-09-07 NOTE — Telephone Encounter (Signed)
Thank you.  No need to repeat.

## 2014-09-07 NOTE — Telephone Encounter (Signed)
Contacted Crystal with [facility] and advised the pt is up to date on his shingles and pneumonia vaccines. She voiced understanding and advised me she would contact the family to inform them.

## 2014-09-08 ENCOUNTER — Other Ambulatory Visit: Payer: Self-pay

## 2014-09-08 ENCOUNTER — Other Ambulatory Visit: Payer: Self-pay | Admitting: *Deleted

## 2014-09-08 NOTE — Patient Outreach (Signed)
CSW called for initial telephone contact. Patient's daughter/caregiver responded and confirmed that she was [facility]. She confirmed that she had asked Dr. Everardo AllEllison to help with potential future placement options and information on increasing patient's level of care. CSW scheduled visit for tomorrow and would present information on local resources for level of care increase.

## 2014-09-08 NOTE — Patient Outreach (Signed)
Care Coordination:  Referral received from telephonic care manager, C. Hutchinson, for community care manager assessment of member.  Member's daughter called Marina Gravel(Angelia - her number is listed as preferred number) due to member's recent history of Alzheimer's.  Marylene Landngela states that she was aware of referral to community are Production designer, theatre/television/filmmanager.  This care manager introduced self and explained that the purpose of the call was to make an appointment for an initial home visit.  Member's daughter states that Child psychotherapistsocial worker, M. Kenard GowerDrew, has an appointment for tomorrow and asks would it be possible to accompany him to his appointment.  Daughter notified that this would be ok, but also made aware that this care manager would be a few minutes late due to an earlier appointment.  Daughter verbalizes understanding.  Initial home visit is scheduled for tomorrow.  Kemper DurieMonica Tawfiq Favila, BSN, [facility] Care Management  [facility] Care [facility] [phone]

## 2014-09-09 ENCOUNTER — Other Ambulatory Visit: Payer: Self-pay

## 2014-09-09 ENCOUNTER — Other Ambulatory Visit: Payer: Self-pay | Admitting: *Deleted

## 2014-09-09 ENCOUNTER — Encounter: Payer: Self-pay | Admitting: *Deleted

## 2014-09-09 NOTE — Patient Outreach (Signed)
    [facility] Social Work  09/09/2014   Anthony [name] 30-Sep-1935 [id number]  Subjective:  Objective:  Current Medications: Current Outpatient Prescriptions  Medication Sig Dispense Refill  . allopurinol (ZYLOPRIM) 300 MG tablet TAKE ONE TABLET BY MOUTH ONCE DAILY. 90 tablet 0  . aspirin 81 MG tablet Take 1 tablet (81 mg total) by mouth daily. 90 tablet 2  . donepezil (ARICEPT) 10 MG tablet Take 1 tablet (10 mg total) by mouth at bedtime. 90 tablet 3  . doxazosin (CARDURA) 4 MG tablet TAKE ONE TABLET BY MOUTH ONCE DAILY AS DIRECTED 90 tablet 0  . furosemide (LASIX) 20 MG tablet TAKE ONE TABLET BY MOUTH ONCE DAILY. 90 tablet 0  . Glucose Blood (BAYER BREEZE 2 TEST) DISK Use twice daily to check Blood sugars 200 each 0  . losartan-hydrochlorothiazide (HYZAAR) 100-25 MG per tablet TAKE ONE TABLET BY MOUTH ONCE DAILY. 90 tablet 0  . memantine (NAMENDA) 10 MG tablet Take 1 tablet (10 mg total) by mouth 2 (two) times daily. 180 tablet 1  . MICRONIZED COLESTIPOL HCL 1 G tablet TAKE FIVE TABLETS BY MOUTH ONCE DAILY. 450 tablet 0   No current facility-administered medications for this visit.    Functional Status: In your present state of health, do you have any difficulty performing the following activities: 09/09/2014  Is the patient deaf or have difficulty hearing? N  Hearing N  Vision Y  Difficulty concentrating or making decisions Y  Walking or climbing stairs? N  Doing errands, shopping? Y  Preparing Food and eating ? N  Using the Toilet? N  In the past six months, have you accidently leaked urine? N  Do you have problems with loss of bowel control? N  Managing your Medications? Y  Managing your Finances? Y  Housekeeping or managing your Housekeeping? Y    Fall/Depression Screening: PHQ 2/9 Scores 09/09/2014  PHQ - 2 Score 0    Assessment: CSW met with patient at home. CSW spoke with daughter Anthony [name] to complete initial assessment. Patient and daughter were only able to  identify one short term goal for completion. Daughter reported that advanced directives would be complete within the week. CSW scheduled to call daughter in two weeks to determine if advanced directives have been completed. CSW presented several resources for care including [facility], [facility], comfort keepers, and [address]. Patient presented with several symptoms of dementia and daughter reported that they mostly engaged social work to get information on advanced directives. CSW made sure patient had calendar tool and contact information. Patient denied the need for EMMI education.  Plan: CSW will call daughter in two weeks to confirm that advanced directives are complete and assess for other needs.

## 2014-09-10 NOTE — Patient Instructions (Signed)
Alzheimer Disease Caregiver Guide Alzheimer disease is an illness that affects a person's brain. It causes a person to lose the ability to remember things and make good decisions. As the disease progresses, the person is unable to take care of himself or herself and needs more and more help to do simple tasks. Taking care of someone with Alzheimer disease can be very challenging and overwhelming.  MEMORY LOSS AND CONFUSION Memory loss and confusion is mild in the beginning stages of the disease. Both of these problems become more severe as the disease progresses. Eventually, the person will not recognize places or even close family members and friends.   Stay calm.  Respond with a short explanation. Long explanations can be overwhelming and confusing.  Avoid corrections that sound like scolding.  Try not to take it personally, even if the person forgets your name. BEHAVIOR CHANGES Behavior changes are part of the disease. The person may develop depression, anxiety, anger, hallucinations, or other behavior changes. These changes can come on suddenly and may be in response to pain, infection, changes in the environment (temperature, noise), overstimulation, or feeling lost or scared.   Try not to take behavior changes personally.  Remain calm and patient.  Do not argue or try to convince the person about a specific point. This will only make him or her more agitated.  Know that the behavior changes are part of the disease process and try to work through it. TIPS TO REDUCE FRUSTRATION  Schedule wisely by making appointments and doing daily tasks, like bathing and dressing, when the person is at his or her best.  Take your time. Simple tasks may take a lot longer, so be sure to allow for plenty of time.  Limit choices. Too many choices can be overwhelming and stressful for the person.  Involve the person in what you are doing.  Stick to a routine.  Avoid new or crowded situations, if  possible.  Use simple words, short sentences, and a calm voice. Only give one direction at a time.  Buy clothes and shoes that are easy to put on and take off.  Let people help if they offer. HOME SAFETY Keeping the home safe is very important to reduce the risk of falls and injuries.   Keep floors clear of clutter. Remove rugs, magazine racks, and floor lamps.  Keep hallways well lit.  Put a handrail and nonslip mat in the bathtub or shower.  Put childproof locks on cabinets with dangerous items, such as medicine, alcohol, guns, toxic cleaning items, sharp tools or utensils, matches, or lighters.  Place locks on doors where the person cannot easily see or reach them. This helps ensure that the person cannot wander out of the house and get lost.  Be prepared for emergencies. Keep a list of emergency phone numbers and addresses in a convenient area. PLANS FOR THE FUTURE  Do not put off talking about finances.  Talk about money management. People with Alzheimer disease have trouble managing their money as the disease gets worse.  Get help from professional advisors regarding financial and legal matters.  Do not put off talking about future care.  Choose a power of attorney. This is someone who can make decisions for the person with Alzheimer disease when he or she is no longer able to do so.  Talk about driving and when it is the right time to stop. The person's health care provider can help give advice on this matter.  Talk about   the person's living situation. If he or she lives alone, you need to make sure he or she is safe. Some people need extra help at home, and others need more care at a nursing home or care center. SUPPORT GROUPS Joining a support group can be very helpful for caregivers of people with Alzheimer disease. Some advantages to being part of a support group include:   Getting strategies to manage stress.  Sharing experiences with others.  Receiving  emotional comfort and support.  Learning new caregiving skills as the disease progresses.  Knowing what community resources are available and taking advantage of them. SEEK MEDICAL CARE IF:  The person has a fever.  The person has a sudden change in behavior that does not improve with calming strategies.  The person is unable to manage in his or her current living situation.  The person threatens you or anyone else, including himself or herself.  You are no longer able to care for the person. Document Released: 02/15/2004 Document Revised: 10/20/2013 Document Reviewed: 07/12/2011 ExitCare Patient Information 2015 [employer]. This information is not intended to replace advice given to you by your health care provider. Make sure you discuss any questions you have with your health care provider.  

## 2014-09-10 NOTE — Patient Outreach (Signed)
09/10/2014  Anthony [name] 10/28/1935 [id number]  Anthony [name] is an 79 y.o. male  Subjective:  Objective: Review of Systems  Psychiatric/Behavioral: Positive for memory loss.    Physical Exam  Constitutional: He is oriented to person, place, and time. He appears well-developed.  Neck: Normal range of motion.  Cardiovascular: Normal rate, regular rhythm, normal heart sounds and intact distal pulses.   Respiratory: Effort normal and breath sounds normal.  GI: Soft. Bowel sounds are normal.  Musculoskeletal: Normal range of motion.  Neurological: He is alert and oriented to person, place, and time.  Skin: Skin is warm and dry.    Current Medications: Current Outpatient Prescriptions  Medication Sig Dispense Refill  . allopurinol (ZYLOPRIM) 300 MG tablet TAKE ONE TABLET BY MOUTH ONCE DAILY. 90 tablet 0  . aspirin 81 MG tablet Take 1 tablet (81 mg total) by mouth daily. 90 tablet 2  . donepezil (ARICEPT) 10 MG tablet Take 1 tablet (10 mg total) by mouth at bedtime. 90 tablet 3  . doxazosin (CARDURA) 4 MG tablet TAKE ONE TABLET BY MOUTH ONCE DAILY AS DIRECTED 90 tablet 0  . furosemide (LASIX) 20 MG tablet TAKE ONE TABLET BY MOUTH ONCE DAILY. 90 tablet 0  . losartan-hydrochlorothiazide (HYZAAR) 100-25 MG per tablet TAKE ONE TABLET BY MOUTH ONCE DAILY. 90 tablet 0  . memantine (NAMENDA) 10 MG tablet Take 1 tablet (10 mg total) by mouth 2 (two) times daily. 180 tablet 1  . MICRONIZED COLESTIPOL HCL 1 G tablet TAKE FIVE TABLETS BY MOUTH ONCE DAILY. 450 tablet 0  . Glucose Blood (BAYER BREEZE 2 TEST) DISK Use twice daily to check Blood sugars 200 each 0   No current facility-administered medications for this visit.    Functional Status: In your present state of health, do you have any difficulty performing the following activities: 09/09/2014  Is the patient deaf or have difficulty hearing? N  Hearing N  Vision Y  Difficulty concentrating or making decisions Y  Walking  or climbing stairs? N  Doing errands, shopping? Y  Preparing Food and eating ? N  Using the Toilet? N  In the past six months, have you accidently leaked urine? N  Do you have problems with loss of bowel control? N  Managing your Medications? Y  Managing your Finances? Y  Housekeeping or managing your Housekeeping? Y    Fall/Depression Screening: PHQ 2/9 Scores 09/09/2014  PHQ - 2 Score 0    Assessment: Met with member and daughter, Artyom Stencel, in [place] home, accompanied by M. Dian Situ, Education officer, museum.  Mr. Dian Situ assists members daughter with community resources.  Member does speak and is involved with conversation, but has trouble remembering and refers to daughter for most of his answers.    Daughter's health related concerns are related to when the member does not take his medications (particularly blood pressure and cholesterol) and the effect it has on his health.  She states that either her sister or herself has been assisting him with taking his medication and has not had any recent problems.  According to [facility] chart, member has history of diabetes, but daughter states that this is no longer a concern and that the member has been taken off of all diabetes medications.  According to chart, member's A1C was 5.3 in March 2015.  Daughter states that the member does have a history of hypertension, but that is controlled as long as he take his medications.  Member does have a blood pressure  machine at home daughter will start checking his blood pressure on a daily basis.  Daughter is also concerned with recent weight loss (around 11 pounds in the last 3 months).  She states that her sister provides meals for him, cooking at least twice a day, but the member continues to lose weight.  She reports that his current weight is not a huge concern at this time because he "needed to lose some weight anyway", but she does not want his weight loss to get out of control.  Daughter and member agrees to  record weights daily, twice a week at the very least, to monitor for severe weight loss.  Care manager contact information provided to member and daughter.  Advised to contact with any concerns.  Daughter denies any further questions at this time.   Plan:  Routine home visit scheduled for next month.    [place]        Patient Outreach from 09/09/2014 in [place] Most recent reading at 09/09/2014  2:24 PM   Care Plan Problem Two  Self care deficit related to new diagnosis of Alzheimers/Dementia   Care Plan for Problem Two  Active   Interventions for Problem Two Long Term Goal   Discussed with daughter the importance of well balanced diet, and adhering to heart healthy diet   [facility] Long Term Goal (31-90) days  Member will    [facility] Long Term Goal Start Date  09/09/14   [facility] CM Short Term Goal #1 (0-30 days)  member will take medications as prescribed for the next 3-4 weeks   [facility] CM Short Term Goal #1 Start Date  09/09/14   [facility] CM Short Term Goal #2 (0-30 days)  Member will weigh self daily, at least twice a week, and record readings over the next 3-4 weeks   [facility] CM Short Term Goal #2 Start Date  09/09/14      Valente David, BSN, [place] Manager [phone]

## 2014-09-15 ENCOUNTER — Other Ambulatory Visit: Payer: Self-pay | Admitting: Endocrinology

## 2014-09-24 ENCOUNTER — Other Ambulatory Visit: Payer: Self-pay

## 2014-09-24 NOTE — Patient Outreach (Signed)
CSW called patient's daughter Marina Gravelngelia to get update on care plan. She reported that patient had completed his advanced directives. She reports there are no other social work needs at this time. CSW plans to follow-up with Nurse CM and doctor to inform them of progress. CSW provided contact information should patient choose in the future to engage in community resources program.

## 2014-09-26 ENCOUNTER — Other Ambulatory Visit: Payer: Self-pay | Admitting: Endocrinology

## 2014-09-29 ENCOUNTER — Telehealth: Payer: Self-pay | Admitting: Endocrinology

## 2014-09-29 NOTE — Telephone Encounter (Signed)
Patient daughter stated that her dad need prior Auth for medication Donepezil 10 mg. Request has been faxed

## 2014-09-30 MED ORDER — DONEPEZIL HCL 10 MG PO TABS: 10.0000 mg | ORAL_TABLET | Freq: Every day | ORAL | Status: DC

## 2014-09-30 NOTE — Telephone Encounter (Signed)
Waiting on fax

## 2014-10-01 ENCOUNTER — Telehealth: Payer: Self-pay | Admitting: Endocrinology

## 2014-10-01 NOTE — Telephone Encounter (Signed)
patient daughter stated that she is waiting for something to be done about her dad medication  For Donepezil, because he is totally out. Please advise

## 2014-10-01 NOTE — Telephone Encounter (Signed)
Contacted pt's daughter. Pt picked up a 90 day supply on 09/15/2014. Pt's daughter thought they had picked up the rx but she had forgot. She stated the pt may have misplaced the medication. She stated she was going to try and locate the medicine. I advised daughter if she could not locate the medication to let our office know. She voiced understanding.

## 2014-10-05 ENCOUNTER — Other Ambulatory Visit: Payer: Self-pay | Admitting: *Deleted

## 2014-10-05 ENCOUNTER — Encounter: Payer: Self-pay | Admitting: *Deleted

## 2014-10-05 NOTE — Patient Outreach (Signed)
[place] [facility]) Care Management   10/05/2014  CARNIE BRUEMMER [birth year] [id number]  Anthony [name] is an 79 y.o. male  Subjective:   Member states that he is doing well, denies any pain or discomfort at this time.  Objective:   ROS  Physical Exam  Constitutional: He is oriented to person, place, and time. He appears well-developed and well-nourished.  Neck: Normal range of motion.  Cardiovascular: Normal rate, regular rhythm and normal heart sounds.   Respiratory: Effort normal and breath sounds normal.  GI: Soft. Bowel sounds are normal.  Genitourinary: Penis normal.  Musculoskeletal: Normal range of motion.  Neurological: He is alert and oriented to person, place, and time.  Skin: Skin is warm and dry.   BP 118/68 mmHg  Pulse 68  Resp 16  Ht 1.753 m (_0 )  Wt 179 lb (81.194 kg)  BMI 26.42 kg/m2  SpO2 98%   Current Medications:   Current Outpatient Prescriptions  Medication Sig Dispense Refill  . allopurinol (ZYLOPRIM) 300 MG tablet TAKE ONE TABLET BY MOUTH ONCE DAILY. 90 tablet 0  . aspirin 81 MG tablet Take 1 tablet (81 mg total) by mouth daily. 90 tablet 2  . donepezil (ARICEPT) 10 MG tablet Take 1 tablet (10 mg total) by mouth at bedtime. 30 tablet 2  . doxazosin (CARDURA) 4 MG tablet TAKE ONE TABLET BY MOUTH ONCE DAILY AS DIRECTED 90 tablet 0  . furosemide (LASIX) 20 MG tablet TAKE ONE TABLET BY MOUTH ONCE DAILY. 90 tablet 0  . losartan-hydrochlorothiazide (HYZAAR) 100-25 MG per tablet TAKE ONE TABLET BY MOUTH ONCE DAILY. 90 tablet 0  . memantine (NAMENDA) 10 MG tablet Take 1 tablet (10 mg total) by mouth 2 (two) times daily. 180 tablet 1  . MICRONIZED COLESTIPOL HCL 1 G tablet TAKE FIVE TABLETS BY MOUTH ONCE DAILY. 450 tablet 0  . donepezil (ARICEPT) 10 MG tablet TAKE ONE TABLET BY MOUTH AT BEDTIME 90 tablet 0  . Glucose Blood (BAYER BREEZE 2 TEST) DISK Use twice daily to check Blood sugars (Patient not taking: Reported on 10/05/2014) 200  each 0   No current facility-administered medications for this visit.    Functional Status:   In your present state of health, do you have any difficulty performing the following activities: 10/05/2014 09/09/2014  Hearing? N N  Vision? Y N  Difficulty concentrating or making decisions? Tempie Donning  Walking or climbing stairs? N Y  Dressing or bathing? N N  Doing errands, shopping? Tempie Donning  Preparing Food and eating ? Y N  Using the Toilet? N N  In the past six months, have you accidently leaked urine? N N  Do you have problems with loss of bowel control? N N  Managing your Medications? Y Y  Managing your Finances? Tempie Donning  Housekeeping or managing your Housekeeping? N Y    Fall/Depression Screening:    PHQ 2/9 Scores 10/05/2014 09/09/2014  PHQ - 2 Score 0 0    Assessment:    Met with member as scheduled, member home alone at the start of visit, then joined by daughter, Everlene Farrier.  At last home visit, member's daughter Bronson Curb, was concerned about member's weight loss over the past several months to a year.  Member has been weighing himself every day and recording the weights.  He has not lost any weight since the last visit.  Daughter, Everlene Farrier, states that she is the one that usually cooks his food, and states that she does make  sure that he abides by his recommended diet.  She states that the member does like to eat a few snacks, such as cakes and cookies, but otherwise follows his diet plan.    Member has a history of hypertension, but does not monitor blood pressure on a daily basis.  Daughter states that the blood pressure machine that the member currently has does not work and that he will need a new one.  She states that obtaining a new one will not be a problem.  Encouraged to monitor and record blood pressure daily once obtained.  Daughter states that between herself and her sister, Bronson Curb, they both make sure that the member is taking his medications on a daily basis.  Everlene Farrier states that the  member is beginning to need more assistance with remembering to take the medications, but does so once reminded.  Everlene Farrier reports that she and her sister have been looking for things to continue to stimulate the member's mind with this new diagnosis of Alzheimer's.  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  Assured the daughter that this care manager would speak with the social worker and seek any recommendations.  Daughter also made aware that there are other face to face resources that could be available to the member if they are interested.    Daughter, Everlene Farrier, and member denies any further concerns at this time.  Advised for the other daughter, Bronson Curb, to call this care manager with any questions or concerns.  Mildred requested to follow up with Angelia to schedule a follow up routine home visit.  Plan:   Will call member's other daughter, Bronson Curb, to scheduled routine home visit for next month. Will contact M. Drew to have him send recommendations for Alzheimer's resources to [facility] daughter.   [place]        Patient Outreach from 10/05/2014 in [place] Problem Two  Self care deficit related to new diagnosis of Alzheimers/Dementia   Care Plan for Problem Two  Active   Interventions for Problem Two Long Term Goal   Discussed with daughter the importance of well balanced diet, and adhering to heart healthy diet   [facility] Long Term Goal (31-90) days  Member will adhere to heart healthy diet within the next 45 days   [facility]N Long Term Goal Start Date  09/09/14   [facility] CM Short Term Goal #1 (0-30 days)  member will take medications as prescribed for the next 3-4 weeks   [facility] CM Short Term Goal #1 Start Date  09/09/14   [facility] CM Short Term Goal #1 Met Date   10/05/14   [facility] CM Short Term Goal #2 (0-30 days)  Member will weigh self daily, at least  twice a week, and record readings over the next 3-4 weeks   [facility] CM Short Term Goal #2 Start Date  09/09/14   [facility] CM Short Term Goal #2 Met Date  10/05/14   Care Plan Problem Three  hypertension   Care Plan for Problem Three  Active   [facility] CM Short Term Goal #1 (0-30 days)  Member/family will obtain blood pressure machine within the next 2 weeks   [facility] CM Short Term Goal #1 Start Date  10/05/14   [facility] CM Short Term Goal #2 (0-30 days)  Member/family will take blood pressure and record  once daily for the next 4 weeks   [facility] CM Short Term Goal #2 Start Date  10/05/14      Valente David, BSN, [place] [phone]

## 2014-10-21 ENCOUNTER — Ambulatory Visit (INDEPENDENT_AMBULATORY_CARE_PROVIDER_SITE_OTHER): Payer: 59 | Admitting: Licensed Clinical Social Worker

## 2014-10-21 DIAGNOSIS — F4322 Adjustment disorder with anxiety: Secondary | ICD-10-CM

## 2014-10-30 ENCOUNTER — Other Ambulatory Visit: Payer: Self-pay | Admitting: *Deleted

## 2014-10-30 NOTE — Patient Outreach (Signed)
Call received back from daughter, Marina Gravelngelia.  Daughter states that member has been doing well.  She reports that he has been seen by Judithe ModestSusan Bond at [facility] for signs/symptons of depression.  She states that Mrs. Bond has suggested for the member to contact Dr. Everardo AllEllison and request a referral for a neurologist for further assessment.  Daughter has requested this care manager to contact Dr. Everardo AllEllison for the referral.  Routine home visit scheduled for 5/23.  Will follow up with daughter regarding referral once Dr. Everardo AllEllison is contacted.  Kemper DurieMonica Raneem Mendolia, BSN, [facility] Care Management  [facility] Care Manager [phone]

## 2014-10-30 NOTE — Patient Outreach (Signed)
Call placed to daughter, Marina Gravelngelia, to schedule a routine home visit.  Home visit not scheduled during last visit at the request of another daughter, Rhunette CroftMildred, who requested for the appointment to be made with her sister, Angelia.  No answer, HIPPA complaint voice message left.  Will await for call back.  Kemper DurieMonica Emeka Lindner, BSN, [facility] Care Management  [facility] Care [facility] [phone]

## 2014-11-02 ENCOUNTER — Telehealth: Payer: Self-pay | Admitting: Endocrinology

## 2014-11-02 ENCOUNTER — Other Ambulatory Visit: Payer: Self-pay | Admitting: *Deleted

## 2014-11-02 DIAGNOSIS — R413 Other amnesia: Secondary | ICD-10-CM

## 2014-11-02 NOTE — Telephone Encounter (Signed)
done

## 2014-11-02 NOTE — Telephone Encounter (Signed)
See note below and please advise, Thanks! 

## 2014-11-02 NOTE — Telephone Encounter (Signed)
[facility]N calling to see if we can refer the pt to neuro for further assessment of alzheimers

## 2014-11-02 NOTE — Patient Outreach (Signed)
Call placed to Dr. George HughEllison's office to inform him of the request from Judithe ModestSusan Bond for the member to have a referral to a neurologist to further evaluate his neurological status and diagnosis of Alzheimer's.  Message left with [place]anna[place]phanie.   Kemper DurieMonica Natarsha Hurwitz, BSN, [facility] Care Management  [facility] Care Manager [phone]

## 2014-11-09 ENCOUNTER — Encounter: Payer: Self-pay | Admitting: *Deleted

## 2014-11-09 ENCOUNTER — Other Ambulatory Visit: Payer: Self-pay | Admitting: Endocrinology

## 2014-11-09 ENCOUNTER — Other Ambulatory Visit: Payer: Self-pay | Admitting: *Deleted

## 2014-11-09 NOTE — Patient Outreach (Signed)
Anthony [name] [facility]) Care Management   11/09/2014  Anthony [name] 10/03/1935 [id number]  Anthony [name] is an 79 y.o. male  Subjective:   Member reports that he is "doing fine."  Denies any pain or discomfort at this time.    Objective:   Review of Systems  Constitutional: Negative.   HENT: Negative.   Eyes: Negative.   Respiratory: Negative.   Cardiovascular: Negative.   Gastrointestinal: Negative.   Genitourinary: Negative.   Musculoskeletal: Negative.   Skin: Negative.   Neurological: Negative.   Endo/Heme/Allergies: Negative.   Psychiatric/Behavioral: Positive for memory loss.    Physical Exam  Constitutional: He appears well-developed and well-nourished.  Neck: Normal range of motion.  Cardiovascular: Normal rate, regular rhythm and normal heart sounds.   Respiratory: Effort normal and breath sounds normal.  GI: Soft. Bowel sounds are normal.  Musculoskeletal: Normal range of motion.  Neurological: He is alert.  Alzheimers, unable to state date of birth   Skin: Skin is warm and dry.   BP 122/78 mmHg  Pulse 68  Resp 16  Ht 1.702 m (_0 )  Wt 177 lb (80.287 kg)  BMI 27.72 kg/m2  SpO2 95%  Current Medications:   Current Outpatient Prescriptions  Medication Sig Dispense Refill  . allopurinol (ZYLOPRIM) 300 MG tablet TAKE ONE TABLET BY MOUTH ONCE DAILY. 90 tablet 0  . aspirin 81 MG tablet Take 1 tablet (81 mg total) by mouth daily. 90 tablet 2  . donepezil (ARICEPT) 10 MG tablet Take 1 tablet (10 mg total) by mouth at bedtime. 30 tablet 2  . doxazosin (CARDURA) 4 MG tablet TAKE ONE TABLET BY MOUTH ONCE DAILY AS DIRECTED 90 tablet 0  . furosemide (LASIX) 20 MG tablet TAKE ONE TABLET BY MOUTH ONCE DAILY. 90 tablet 0  . losartan-hydrochlorothiazide (HYZAAR) 100-25 MG per tablet TAKE ONE TABLET BY MOUTH ONCE DAILY. 90 tablet 0  . memantine (NAMENDA) 10 MG tablet Take 1 tablet (10 mg total) by mouth 2 (two) times daily. 180 tablet 1  . MICRONIZED  COLESTIPOL HCL 1 G tablet TAKE FIVE TABLETS BY MOUTH ONCE DAILY. 450 tablet 0  . donepezil (ARICEPT) 10 MG tablet TAKE ONE TABLET BY MOUTH AT BEDTIME (Patient not taking: Reported on 11/09/2014) 90 tablet 0  . Glucose Blood (BAYER BREEZE 2 TEST) DISK Use twice daily to check Blood sugars (Patient not taking: Reported on 10/05/2014) 200 each 0   No current facility-administered medications for this visit.    Functional Status:   In your present state of health, do you have any difficulty performing the following activities: 11/09/2014 10/05/2014  Hearing? N N  Vision? Y Y  Difficulty concentrating or making decisions? Tempie Donning  Walking or climbing stairs? N N  Dressing or bathing? N N  Doing errands, shopping? Tempie Donning  Preparing Food and eating ? Y Y  Using the Toilet? N N  In the past six months, have you accidently leaked urine? N N  Do you have problems with loss of bowel control? N N  Managing your Medications? Y Y  Managing your Finances? Tempie Donning  Housekeeping or managing your Housekeeping? N N    Fall/Depression Screening:    PHQ 2/9 Scores 10/05/2014 09/09/2014  PHQ - 2 Score 0 0    Assessment:    Met with member at his home at scheduled time.  Daughters Mildred & Angelia present during visit.  Member is still suffering from some memory loss, which is to be expected  with his diagnosis of Alzheimer's.  Daughter states that they have received a call about the referral to a neurologist, which has been scheduled for July.  She states that [name] at the behavioral health clinic would like to be notified after this appointment is complete.  Daughter states that she will contact Ms. Bond and notify her of the appointment and see if she would like to see the member sooner.  Activities for Alzheimer's patients discussed, including getting the member out of the house more on a daily basis to increase stimulation.  Daughter states that going to a facility or center for activities may not be the best  option for the member unless one of the family members is able to stay with the member.  The family states that they will begin to increase his activity more and download brain stimulating activities specifically for Alzheimer's patients to make them available for the member.  Member states that enjoys working out in the yard and is able to do so without any problems when the weather is nice.  He states that he will continue to do that.  Member has been obtaining his weight on a daily basis and recording it, however the family still has not obtained a reliable blood pressure machine for daily blood pressure monitoring (member has history of hypertension).  Explained to member's daughter that his hypertension is currently under control, but it would be a good idea to still obtain a machine for monitoring.  Daughter states that she will work on trouble shooting with his old machine, but will obtain a new one if needed.  Member and daughters deny any further concerns at this time.  This care manager discussed having a follow up call from a telephonic care manager or health coach for next month instead of a home visit, and a possible case closure once daily monitoring of blood pressure has started, displaying controlled hypertension.  Advised to continue to contact the 24 hour nurse line if needed.  Plan:   Will consult telephonic health coach to discuss assessment for further telephonic needs.  [facility] CM Care Plan Problem One        Patient Outreach from 11/09/2014 in [place] for Problem One  Not Active    [facility] CM Care Plan Problem Two        Patient Outreach from 11/09/2014 in [place] Problem Two  Self care deficit related to new diagnosis of Alzheimers/Dementia   Care Plan for Problem Two  Not Active   Interventions for Problem Two Long Term Goal   Discussed with daughter the importance of well balanced diet, and adhering to heart healthy diet    [facility] Long Term Goal (31-90) days  Member will adhere to heart healthy diet within the next 45 days   [facility] Long Term Goal Start Date  09/09/14   [facility] Long Term Goal Met Date  11/09/14 Lenor Coffin to care for self with the assistnce of daughters]   [facility] CM Short Term Goal #1 (0-30 days)  member will take medications as prescribed for the next 3-4 weeks   [facility] CM Short Term Goal #1 Start Date  09/09/14   [facility] CM Short Term Goal #1 Met Date   10/05/14   Interventions for Short Term Goal #2   Discussed importance of taking medications as prescribed, discussed use of pill box (daughter fills pill box weekly)   [facility] CM Short Term Goal #  2 (0-30 days)  Member will weigh self daily, at least twice a week, and record readings over the next 3-4 weeks   [facility] CM Short Term Goal #2 Start Date  09/09/14   [facility] CM Short Term Goal #2 Met Date  10/05/14 Maylon Peppers has lost inches and gone down in clothes size, but has not lost any weight]   Interventions for Short Term Goal #2  Discussed the importance weighing self to capture any potential weight loss, discussed with daughter about assisting member when needed    [place] Problem Three        Patient Outreach from 11/09/2014 in [place] Problem Three  hypertension   Care Plan for Problem Three  Active   [facility] CM Short Term Goal #1 (0-30 days)  Member/family will obtain blood pressure machine within the next 2 weeks   [facility] CM Short Term Goal #1 Start Date  11/09/14 [original goal date not met, start date rescheduled]   Interventions for Short Term Goal #1  Discussed options for obtaining a new blood pressure machine   [facility] CM Short Term Goal #2 (0-30 days)  Member/family will take blood pressure and record once daily for the next 4 weeks   [facility] CM Short Term Goal #2 Start Date  11/09/14 [Original goal date not met, start date rescheduled]   Interventions for Short Term Goal #2  Discussed with daughter the importance of monitoring member's blood  pressure while taking antihypertensives       Valente David, BSN, [place] Manager [phone]

## 2014-11-11 ENCOUNTER — Encounter: Payer: Self-pay | Admitting: *Deleted

## 2014-11-11 NOTE — Progress Notes (Signed)
This encounter was created in error - please disregard.

## 2014-11-19 ENCOUNTER — Encounter: Payer: Self-pay | Admitting: *Deleted

## 2014-11-27 ENCOUNTER — Encounter: Payer: Self-pay | Admitting: *Deleted

## 2014-11-27 NOTE — Progress Notes (Signed)
This encounter was created in error - please disregard.

## 2014-12-04 ENCOUNTER — Ambulatory Visit (INDEPENDENT_AMBULATORY_CARE_PROVIDER_SITE_OTHER): Payer: Medicare Other | Admitting: Neurology

## 2014-12-04 ENCOUNTER — Encounter: Payer: Self-pay | Admitting: Neurology

## 2014-12-04 VITALS — BP 126/74 | HR 66 | Resp 18 | Ht 69.0 in | Wt 177.7 lb

## 2014-12-04 DIAGNOSIS — F0390 Unspecified dementia without behavioral disturbance: Secondary | ICD-10-CM | POA: Diagnosis not present

## 2014-12-04 DIAGNOSIS — F03C0 Unspecified dementia, severe, without behavioral disturbance, psychotic disturbance, mood disturbance, and anxiety: Secondary | ICD-10-CM | POA: Insufficient documentation

## 2014-12-04 DIAGNOSIS — F03B0 Unspecified dementia, moderate, without behavioral disturbance, psychotic disturbance, mood disturbance, and anxiety: Secondary | ICD-10-CM

## 2014-12-04 NOTE — Patient Instructions (Signed)
1. Continue all your current medication 2. Physical exercise and brain stimulation exercises are important for brain health 3. Recommend 24/7 home supervision 4. Follow-up in 1 year
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
## 2014-12-05 ENCOUNTER — Other Ambulatory Visit: Payer: Self-pay | Admitting: Endocrinology

## 2014-12-17 ENCOUNTER — Encounter: Payer: Self-pay | Admitting: *Deleted

## 2014-12-17 ENCOUNTER — Other Ambulatory Visit: Payer: Self-pay | Admitting: *Deleted

## 2014-12-17 NOTE — Patient Outreach (Signed)
Call placed to member's daughter for follow up on member's status and possible case closure.  Daughter, Darrnell Mangiaracina, states that the member is doing well and has not had any issues since last home visit last month.  She states that the member had an appointment with the neurologist and the suggestion was made to look into possible resources for in-home assistance once the member's mental status declines more.  Daughter states that at this point, she and her sister are able to manage the care of the member, but is interested in receiving resources for future purposes.  Will contact Education officer, museum and obtain resources to email to [employer] daughter for future reference.  Daughter made aware of goals being met, denies any further assistance.  Daughter made aware that case would be closed once resources were emailed.  Encouraged to contact [facility] if member status changes and further assistance is warranted.  Daughter verbalizes understanding.  Will send physician a case closure letter. Will contact care manager assistant to close case.  Valente David, BSN, [facility]  [facility] Care Manager [phone]

## 2014-12-18 ENCOUNTER — Encounter: Payer: Self-pay | Admitting: *Deleted

## 2014-12-24 NOTE — Patient Outreach (Signed)
[facility]) Care [name]  12/24/2014  Anthony [name] 08-Mar-1936 [id number]   Notification from Valente David, RN to close case as Anthony [name] goals are met.  Anthony [name]. [place], [name] Assistant Phone: [phone] Fax: [phone]

## 2015-01-04 ENCOUNTER — Ambulatory Visit: Payer: Self-pay | Admitting: Neurology

## 2015-02-10 ENCOUNTER — Other Ambulatory Visit: Payer: Self-pay | Admitting: Endocrinology

## 2015-02-22 ENCOUNTER — Other Ambulatory Visit: Payer: Self-pay | Admitting: Endocrinology

## 2015-03-09 ENCOUNTER — Other Ambulatory Visit: Payer: Self-pay | Admitting: Endocrinology

## 2015-03-15 ENCOUNTER — Other Ambulatory Visit: Payer: Self-pay | Admitting: Endocrinology

## 2015-05-06 ENCOUNTER — Other Ambulatory Visit: Payer: Self-pay | Admitting: Endocrinology

## 2015-05-18 ENCOUNTER — Other Ambulatory Visit: Payer: Self-pay | Admitting: Endocrinology

## 2015-06-17 ENCOUNTER — Other Ambulatory Visit: Payer: Self-pay | Admitting: Endocrinology

## 2015-06-22 ENCOUNTER — Other Ambulatory Visit: Payer: Self-pay | Admitting: Endocrinology

## 2015-07-05 ENCOUNTER — Other Ambulatory Visit: Payer: Self-pay | Admitting: Endocrinology

## 2015-08-06 ENCOUNTER — Encounter: Payer: Self-pay | Admitting: Neurology

## 2015-08-22 ENCOUNTER — Other Ambulatory Visit: Payer: Self-pay | Admitting: Endocrinology

## 2015-08-25 ENCOUNTER — Other Ambulatory Visit: Payer: Self-pay | Admitting: Endocrinology

## 2015-08-25 ENCOUNTER — Telehealth: Payer: Self-pay | Admitting: Endocrinology

## 2015-08-25 MED ORDER — ALLOPURINOL 300 MG PO TABS: 300.0000 mg | ORAL_TABLET | Freq: Every day | ORAL | Status: DC

## 2015-08-25 MED ORDER — DONEPEZIL HCL 10 MG PO TABS: 10.0000 mg | ORAL_TABLET | Freq: Every day | ORAL | Status: DC

## 2015-08-25 MED ORDER — DOXAZOSIN MESYLATE 4 MG PO TABS: ORAL_TABLET | ORAL | Status: DC

## 2015-08-25 MED ORDER — FUROSEMIDE 20 MG PO TABS: 20.0000 mg | ORAL_TABLET | Freq: Every day | ORAL | Status: DC

## 2015-08-25 NOTE — Telephone Encounter (Signed)
Please refill x 1 Ov is due  

## 2015-08-25 NOTE — Telephone Encounter (Signed)
See note below about the colestipol. I have refilled the other medications for a 30 day supply. Pt has been notified to schedule appointment.

## 2015-08-25 NOTE — Telephone Encounter (Signed)
PT daughter called, she said PT needs refills of Donepezil Doxasosin Allopurinol Furosemide Colestipol  Also she said that he is now having a hard time taking the Colestipol because they are so large, she wants to know if there is an alternative or different form

## 2015-08-25 NOTE — Telephone Encounter (Signed)
Pt scheduled for 09/10/2015.

## 2015-09-10 ENCOUNTER — Ambulatory Visit
Admission: RE | Admit: 2015-09-10 | Discharge: 2015-09-10 | Disposition: A | Discharge status: Home / Self Care | Payer: Medicare Other | Source: Ambulatory Visit | Attending: Endocrinology | Admitting: Endocrinology

## 2015-09-10 ENCOUNTER — Ambulatory Visit (INDEPENDENT_AMBULATORY_CARE_PROVIDER_SITE_OTHER): Payer: Medicare Other | Admitting: Endocrinology

## 2015-09-10 ENCOUNTER — Encounter: Payer: Self-pay | Admitting: Endocrinology

## 2015-09-10 VITALS — BP 138/87 | HR 60 | Temp 97.9°F | Ht 69.0 in | Wt 182.0 lb

## 2015-09-10 DIAGNOSIS — D649 Anemia, unspecified: Secondary | ICD-10-CM

## 2015-09-10 DIAGNOSIS — Z0000 Encounter for general adult medical examination without abnormal findings: Secondary | ICD-10-CM

## 2015-09-10 DIAGNOSIS — R0989 Other specified symptoms and signs involving the circulatory and respiratory systems: Secondary | ICD-10-CM

## 2015-09-10 DIAGNOSIS — R0689 Other abnormalities of breathing: Secondary | ICD-10-CM

## 2015-09-10 DIAGNOSIS — I10 Essential (primary) hypertension: Secondary | ICD-10-CM | POA: Diagnosis not present

## 2015-09-10 DIAGNOSIS — R739 Hyperglycemia, unspecified: Secondary | ICD-10-CM

## 2015-09-10 DIAGNOSIS — E785 Hyperlipidemia, unspecified: Secondary | ICD-10-CM | POA: Diagnosis not present

## 2015-09-10 DIAGNOSIS — Z125 Encounter for screening for malignant neoplasm of prostate: Secondary | ICD-10-CM

## 2015-09-10 DIAGNOSIS — Z0189 Encounter for other specified special examinations: Secondary | ICD-10-CM

## 2015-09-10 DIAGNOSIS — D696 Thrombocytopenia, unspecified: Secondary | ICD-10-CM | POA: Diagnosis not present

## 2015-09-10 DIAGNOSIS — M109 Gout, unspecified: Secondary | ICD-10-CM

## 2015-09-10 LAB — URINALYSIS, ROUTINE W REFLEX MICROSCOPIC
Bilirubin Urine: NEGATIVE
Hgb urine dipstick: NEGATIVE
Ketones, ur: NEGATIVE
Leukocytes, UA: NEGATIVE
Nitrite: NEGATIVE
Specific Gravity, Urine: 1.02
Total Protein, Urine: 100 — AB
Urine Glucose: NEGATIVE
Urobilinogen, UA: 0.2
pH: 6 (ref 5.0–8.0)

## 2015-09-10 LAB — LIPID PANEL
Cholesterol: 155 mg/dL (ref 0–200)
HDL: 47.1 mg/dL
LDL Cholesterol: 94 mg/dL (ref 0–99)
NonHDL: 107.63
Total CHOL/HDL Ratio: 3
Triglycerides: 66 mg/dL (ref 0.0–149.0)
VLDL: 13.2 mg/dL (ref 0.0–40.0)

## 2015-09-10 LAB — BASIC METABOLIC PANEL WITH GFR
BUN: 20 mg/dL (ref 6–23)
CO2: 32 meq/L (ref 19–32)
Calcium: 9 mg/dL (ref 8.4–10.5)
Chloride: 106 meq/L (ref 96–112)
Creatinine, Ser: 1.56 mg/dL — ABNORMAL HIGH (ref 0.40–1.50)
GFR: 55.69 mL/min — ABNORMAL LOW
Glucose, Bld: 129 mg/dL — ABNORMAL HIGH (ref 70–99)
Potassium: 3.5 meq/L (ref 3.5–5.1)
Sodium: 145 meq/L (ref 135–145)

## 2015-09-10 LAB — CBC WITH DIFFERENTIAL/PLATELET
Basophils Absolute: 0 10*3/uL (ref 0.0–0.1)
Basophils Relative: 0.4 % (ref 0.0–3.0)
Eosinophils Absolute: 0.1 10*3/uL (ref 0.0–0.7)
Eosinophils Relative: 1.4 % (ref 0.0–5.0)
HCT: 41 % (ref 39.0–52.0)
Hemoglobin: 12.9 g/dL — ABNORMAL LOW (ref 13.0–17.0)
Lymphocytes Relative: 16.5 % (ref 12.0–46.0)
Lymphs Abs: 1.4 10*3/uL (ref 0.7–4.0)
MCHC: 31.4 g/dL (ref 30.0–36.0)
MCV: 82 fl (ref 78.0–100.0)
Monocytes Absolute: 0.3 10*3/uL (ref 0.1–1.0)
Monocytes Relative: 3.7 % (ref 3.0–12.0)
Neutro Abs: 6.5 10*3/uL (ref 1.4–7.7)
Neutrophils Relative %: 78 % — ABNORMAL HIGH (ref 43.0–77.0)
Platelets: 152 10*3/uL (ref 150.0–400.0)
RBC: 5 Mil/uL (ref 4.22–5.81)
RDW: 16.9 % — ABNORMAL HIGH (ref 11.5–15.5)
WBC: 8.3 10*3/uL (ref 4.0–10.5)

## 2015-09-10 LAB — IBC PANEL
Iron: 83 ug/dL (ref 42–165)
Saturation Ratios: 34.5 % (ref 20.0–50.0)
Transferrin: 172 mg/dL — ABNORMAL LOW (ref 212.0–360.0)

## 2015-09-10 LAB — TSH: TSH: 1.09 u[IU]/mL (ref 0.35–4.50)

## 2015-09-10 LAB — POCT GLYCOSYLATED HEMOGLOBIN (HGB A1C): Hemoglobin A1C: 5

## 2015-09-10 LAB — HEPATIC FUNCTION PANEL
ALBUMIN: 3.7 g/dL (ref 3.5–5.2)
ALT: 16 U/L (ref 0–53)
AST: 21 U/L (ref 0–37)
Alkaline Phosphatase: 80 U/L (ref 39–117)
BILIRUBIN TOTAL: 1 mg/dL (ref 0.2–1.2)
Bilirubin, Direct: 0.2 mg/dL (ref 0.0–0.3)
Total Protein: 6.9 g/dL (ref 6.0–8.3)

## 2015-09-10 LAB — PSA: PSA: 1.92 ng/mL (ref 0.10–4.00)

## 2015-09-10 MED ORDER — COLESTIPOL HCL 5 G PO GRAN: 5.0000 g | GRANULES | Freq: Two times a day (BID) | ORAL | Status: DC

## 2015-09-10 NOTE — Patient Instructions (Addendum)
good diet and exercise significantly improve your health.  please let me know if you wish to be referred to a dietician.  high blood sugar is very risky to your health.  you should see an eye doctor and dentist every year.  It is very important to get all recommended vaccinations.  please consider these measures for your health:  minimize alcohol.  do not use tobacco products.  have a colonoscopy at least every 10 years from age 89.  keep firearms safely stored.  always use seat belts.  have working smoke alarms in your home.  see an eye doctor and dentist regularly.  never drive under the influence of alcohol or drugs (including prescription drugs).   it is critically important to prevent falling down (keep floor areas well-lit, dry, and free of loose objects.  If you have a cane, walker, or wheelchair, you should use it, even for short trips around the house.  Wear flat-soled shoes.  Also, try not to rush) blood tests are requested for you today.  We'll let you know about the results.   Please return in 1 year.

## 2015-09-10 NOTE — Progress Notes (Signed)
Subjective:    Patient ID: Anthony [name], male    DOB: 09/30/1935, 80 y.o.   MRN: [id number]  HPI Pt is here for regular wellness examination, and is feeling pretty well in general, and says chronic med probs are stable, except as noted below. Past Medical History  Diagnosis Date  . UROLITHIASIS, HX OF 03/15/2007  . DIABETES MELLITUS, TYPE II 03/15/2007  . NEPHROPATHY, DIABETIC 03/15/2007  . HYPERLIPIDEMIA 03/15/2007  . GOUT 03/15/2007  . HYPERTENSION 03/15/2007  . RENAL INSUFFICIENCY 04/30/2009  . ABNORMAL ELECTROCARDIOGRAM 05/03/2010  . SHOULDER PAIN, BILATERAL 04/29/2008  . GALLSTONES 03/15/2007    Past Surgical History  Procedure Laterality Date  . Nasal septum surgery  1978    Social History   Social History  . Marital Status: Divorced    Spouse Name: N/A  . Number of Children: N/A  . Years of Education: N/A   Occupational History  . Retired    Social History Main Topics  . Smoking status: Never Smoker   . Smokeless tobacco: Never Used  . Alcohol Use: No  . Drug Use: No  . Sexual Activity: No   Other Topics Concern  . Not on file   Social History Narrative   NOK is Daughter Sharol Given          Current Outpatient Prescriptions on File Prior to Visit  Medication Sig Dispense Refill  . allopurinol (ZYLOPRIM) 300 MG tablet Take 1 tablet (300 mg total) by mouth daily. 30 tablet 0  . aspirin 81 MG tablet Take 1 tablet (81 mg total) by mouth daily. 90 tablet 2  . donepezil (ARICEPT) 10 MG tablet TAKE ONE TABLET BY MOUTH AT BEDTIME 90 tablet 0  . donepezil (ARICEPT) 10 MG tablet Take 1 tablet (10 mg total) by mouth at bedtime. 30 tablet 0  . doxazosin (CARDURA) 4 MG tablet TAKE ONE TABLET BY MOUTH ONCE DAILY. PATIENT NEEDS APPOINTMENT FOR FUTHER REFILLS 30 tablet 0  . furosemide (LASIX) 20 MG tablet Take 1 tablet (20 mg total) by mouth daily. 30 tablet 0  . Glucose Blood (BAYER BREEZE 2 TEST) DISK Use twice daily to check Blood sugars 200 each 0  .  losartan-hydrochlorothiazide (HYZAAR) 100-25 MG per tablet TAKE ONE TABLET BY MOUTH ONCE DAILY 90 tablet 0  . memantine (NAMENDA) 10 MG tablet TAKE ONE TABLET BY MOUTH TWICE DAILY 180 tablet 0   No current facility-administered medications on file prior to visit.    Allergies  Allergen Reactions  . Amlodipine-Atorvastatin     REACTION: LEG CRAMPS  . Benazepril Hcl     REACTION: Cough  . Ezetimibe     REACTION: edema    Family History  Problem Relation Age of Onset  . Cancer Father     Prostate Cancer  . Colon cancer Neg Hx   . Esophageal cancer Neg Hx   . Stomach cancer Neg Hx     BP 138/87 mmHg  Pulse 60  Temp(Src) 97.9 F (36.6 C) (Oral)  Ht  (1.753 m)  Wt 182 lb (82.555 kg)  BMI 26.86 kg/m2  SpO2 98%  Review of Systems  Constitutional: Negative for fever and unexpected weight change.  HENT: Negative for tinnitus.   Eyes: Negative for photophobia.  Respiratory: Negative for shortness of breath.   Cardiovascular: Negative for chest pain.  Gastrointestinal: Negative for blood in stool.  Endocrine: Positive for cold intolerance.  Genitourinary: Negative for hematuria.  Musculoskeletal: Negative for back pain.  Skin: Negative  for rash.  Allergic/Immunologic: Negative for environmental allergies.  Neurological: Negative for syncope, numbness and headaches.  Hematological: Does not bruise/bleed easily.  Psychiatric/Behavioral: Negative for sleep disturbance.       Objective:   Physical Exam VS: see vs page GEN: no distress HEAD: head: no deformity eyes: no periorbital swelling, no proptosis external nose and ears are normal mouth: no lesion seen NECK: supple, thyroid is not enlarged CHEST WALL: no deformity LUNGS: clear to auscultation, except for rales at the left base BREASTS:  bilat pseudogynecomastia. CV: reg rate and rhythm, no murmur ABD: abdomen is soft, nontender.  no hepatosplenomegaly.  not distended.  no hernia RECTAL: normal external and  internal exam.  heme neg. PROSTATE:  Normal size.  No nodule MUSCULOSKELETAL: muscle bulk and strength are grossly normal.  no obvious joint swelling.  gait is normal and steady EXTEMITIES: no deformity.  no ulcer on the feet.  feet are of normal color and temp.  no edema PULSES: dorsalis pedis intact bilat.  no carotid bruit NEURO:  cn 2-12 grossly intact.   readily moves all 4's.  sensation is intact to touch on the feet SKIN:  Normal texture and temperature.  No rash or suspicious lesion is visible.   NODES:  None palpable at the neck.   PSYCH: alert.  Does not appear anxious nor depressed.     i personally reviewed electrocardiogram tracing (today): Indication: HTN Impression: SB/FD-AVB    Assessment & Plan:  Wellness visit today, with problems stable, except as noted.   Subjective:   Patient here for Medicare annual wellness visit and management of other chronic and acute problems.     Risk factors: advanced age    Roster of Physicians Providing Medical Care to Patient:  See "snapshot"   Activities of Daily Living: In your present state of health, do you have any difficulty performing the following activities (lives with dtr)?:  Preparing food and eating?: yes  Bathing yourself: yes Getting dressed: yes Using the toilet: No  Moving around from place to place: No  In the past year have you fallen or had a near fall?: No    Home Safety: Has smoke detector and wears seat belts. No firearms.  Diet and Exercise  Current exercise habits:  Dietary issues discussed: healthy diet   Depression Screen  Q1: Over the past two weeks, have you felt down, depressed or hopeless? no  Q2: Over the past two weeks, have you felt little interest or pleasure in doing things? no   The following portions of the patient's history were reviewed and updated as appropriate: allergies, current medications, past family history, past medical history, past social history, past surgical history and  problem list.   Review of Systems  Denies hearing loss, and visual loss Objective:   Vision:  Curatorees opthalmologist, but does not recall name Hearing: grossly normal Body mass index:  See vs page Msk: pt slowly performs "get-up-and-go" from a sitting position.   Cognitive Impairment Assessment: cognition, memory and judgment appear normal.  remembers 0/3 at 5 minutes.  excellent recall.  can easily read and write a sentence.  alert but oriented to self only.   Assessment:   Medicare wellness utd on preventive parameters    Plan:   During the course of the visit the patient was educated and counseled about appropriate screening and preventive services including:        Fall prevention   Diabetes screening  Nutrition counseling   Vaccines /  LABS Zostavax / Pneumococcal Vaccine  today  PSA  Patient Instructions (the written plan) was given to the patient.

## 2015-09-13 ENCOUNTER — Other Ambulatory Visit: Payer: Self-pay

## 2015-09-13 LAB — URIC ACID: URIC ACID, SERUM: 3.9 mg/dL — AB (ref 4.0–7.8)

## 2015-09-13 MED ORDER — COLESTIPOL HCL 5 G PO GRAN: 5.0000 g | GRANULES | Freq: Two times a day (BID) | ORAL | Status: DC

## 2015-09-30 ENCOUNTER — Other Ambulatory Visit: Payer: Self-pay | Admitting: Endocrinology

## 2015-10-21 ENCOUNTER — Other Ambulatory Visit: Payer: Self-pay | Admitting: Endocrinology

## 2015-11-18 ENCOUNTER — Encounter: Payer: Self-pay | Admitting: Family Medicine

## 2015-11-20 ENCOUNTER — Other Ambulatory Visit: Payer: Self-pay | Admitting: Endocrinology

## 2015-11-29 ENCOUNTER — Other Ambulatory Visit: Payer: Self-pay | Admitting: Endocrinology

## 2015-12-06 ENCOUNTER — Ambulatory Visit: Payer: Self-pay | Admitting: Neurology

## 2015-12-22 ENCOUNTER — Other Ambulatory Visit: Payer: Self-pay | Admitting: Endocrinology

## 2015-12-23 ENCOUNTER — Other Ambulatory Visit: Payer: Self-pay | Admitting: Endocrinology

## 2015-12-27 ENCOUNTER — Other Ambulatory Visit: Payer: Self-pay | Admitting: Endocrinology

## 2015-12-31 ENCOUNTER — Other Ambulatory Visit: Payer: Self-pay | Admitting: Endocrinology

## 2016-01-20 ENCOUNTER — Other Ambulatory Visit: Payer: Self-pay | Admitting: Endocrinology

## 2016-02-17 ENCOUNTER — Other Ambulatory Visit: Payer: Self-pay | Admitting: Endocrinology

## 2016-02-20 ENCOUNTER — Emergency Department (HOSPITAL_COMMUNITY): Payer: Medicare Other

## 2016-02-20 ENCOUNTER — Encounter (HOSPITAL_COMMUNITY): Payer: Self-pay | Admitting: *Deleted

## 2016-02-20 ENCOUNTER — Inpatient Hospital Stay (HOSPITAL_COMMUNITY)
Admission: EM | Admit: 2016-02-20 | Discharge: 2016-02-24 | DRG: 378 | Disposition: A | Discharge status: Home Health | Payer: Medicare Other | Attending: Internal Medicine | Admitting: Internal Medicine

## 2016-02-20 DIAGNOSIS — N183 Chronic kidney disease, stage 3 (moderate): Secondary | ICD-10-CM | POA: Diagnosis present

## 2016-02-20 DIAGNOSIS — Z87442 Personal history of urinary calculi: Secondary | ICD-10-CM | POA: Diagnosis not present

## 2016-02-20 DIAGNOSIS — T39395A Adverse effect of other nonsteroidal anti-inflammatory drugs [NSAID], initial encounter: Secondary | ICD-10-CM | POA: Diagnosis present

## 2016-02-20 DIAGNOSIS — R32 Unspecified urinary incontinence: Secondary | ICD-10-CM | POA: Diagnosis present

## 2016-02-20 DIAGNOSIS — I10 Essential (primary) hypertension: Secondary | ICD-10-CM | POA: Diagnosis not present

## 2016-02-20 DIAGNOSIS — K922 Gastrointestinal hemorrhage, unspecified: Secondary | ICD-10-CM | POA: Diagnosis not present

## 2016-02-20 DIAGNOSIS — K921 Melena: Secondary | ICD-10-CM | POA: Diagnosis not present

## 2016-02-20 DIAGNOSIS — F0280 Dementia in other diseases classified elsewhere without behavioral disturbance: Secondary | ICD-10-CM

## 2016-02-20 DIAGNOSIS — I129 Hypertensive chronic kidney disease with stage 1 through stage 4 chronic kidney disease, or unspecified chronic kidney disease: Secondary | ICD-10-CM | POA: Diagnosis present

## 2016-02-20 DIAGNOSIS — N179 Acute kidney failure, unspecified: Secondary | ICD-10-CM | POA: Diagnosis not present

## 2016-02-20 DIAGNOSIS — N400 Enlarged prostate without lower urinary tract symptoms: Secondary | ICD-10-CM

## 2016-02-20 DIAGNOSIS — Z7982 Long term (current) use of aspirin: Secondary | ICD-10-CM | POA: Diagnosis not present

## 2016-02-20 DIAGNOSIS — G3183 Dementia with Lewy bodies: Secondary | ICD-10-CM | POA: Diagnosis present

## 2016-02-20 DIAGNOSIS — R55 Syncope and collapse: Secondary | ICD-10-CM | POA: Diagnosis present

## 2016-02-20 DIAGNOSIS — K219 Gastro-esophageal reflux disease without esophagitis: Secondary | ICD-10-CM | POA: Diagnosis present

## 2016-02-20 DIAGNOSIS — G309 Alzheimer's disease, unspecified: Secondary | ICD-10-CM | POA: Diagnosis present

## 2016-02-20 DIAGNOSIS — E785 Hyperlipidemia, unspecified: Secondary | ICD-10-CM | POA: Diagnosis present

## 2016-02-20 DIAGNOSIS — R001 Bradycardia, unspecified: Secondary | ICD-10-CM

## 2016-02-20 DIAGNOSIS — D62 Acute posthemorrhagic anemia: Secondary | ICD-10-CM | POA: Diagnosis present

## 2016-02-20 DIAGNOSIS — Z8042 Family history of malignant neoplasm of prostate: Secondary | ICD-10-CM

## 2016-02-20 DIAGNOSIS — E1122 Type 2 diabetes mellitus with diabetic chronic kidney disease: Secondary | ICD-10-CM | POA: Diagnosis present

## 2016-02-20 HISTORY — DX: Alzheimer's disease, unspecified: G30.9

## 2016-02-20 HISTORY — DX: Dementia in other diseases classified elsewhere, unspecified severity, without behavioral disturbance, psychotic disturbance, mood disturbance, and anxiety: F02.80

## 2016-02-20 LAB — CBC
HEMATOCRIT: 34.5 % — AB (ref 39.0–52.0)
HEMOGLOBIN: 10.8 g/dL — AB (ref 13.0–17.0)
MCH: 25.4 pg — AB (ref 26.0–34.0)
MCHC: 31.3 g/dL (ref 30.0–36.0)
MCV: 81.2 fL (ref 78.0–100.0)
Platelets: ADEQUATE 10*3/uL (ref 150–400)
RBC: 4.25 MIL/uL (ref 4.22–5.81)
RDW: 14.7 % (ref 11.5–15.5)
WBC: 8.1 10*3/uL (ref 4.0–10.5)

## 2016-02-20 LAB — I-STAT TROPONIN, ED: Troponin i, poc: 0.01 ng/mL (ref 0.00–0.08)

## 2016-02-20 LAB — COMPREHENSIVE METABOLIC PANEL
ALK PHOS: 62 U/L (ref 38–126)
ALT: 11 U/L — AB (ref 17–63)
AST: 19 U/L (ref 15–41)
Albumin: 3 g/dL — ABNORMAL LOW (ref 3.5–5.0)
Anion gap: 4 — ABNORMAL LOW (ref 5–15)
BUN: 48 mg/dL — AB (ref 6–20)
CALCIUM: 8.7 mg/dL — AB (ref 8.9–10.3)
CHLORIDE: 114 mmol/L — AB (ref 101–111)
CO2: 26 mmol/L (ref 22–32)
CREATININE: 1.85 mg/dL — AB (ref 0.61–1.24)
GFR, EST AFRICAN AMERICAN: 39 mL/min — AB (ref >60)
GFR, EST NON AFRICAN AMERICAN: 33 mL/min — AB (ref >60)
Glucose, Bld: 85 mg/dL (ref 65–99)
Potassium: 4.2 mmol/L (ref 3.5–5.1)
Sodium: 144 mmol/L (ref 135–145)
Total Bilirubin: 0.7 mg/dL (ref 0.3–1.2)
Total Protein: 5.9 g/dL — ABNORMAL LOW (ref 6.5–8.1)

## 2016-02-20 LAB — POC OCCULT BLOOD, ED: Fecal Occult Bld: POSITIVE — AB

## 2016-02-20 LAB — ABO/RH: ABO/RH(D): O POS

## 2016-02-20 LAB — HEMOGLOBIN AND HEMATOCRIT, BLOOD
HEMATOCRIT: 34.2 % — AB (ref 39.0–52.0)
Hemoglobin: 10.6 g/dL — ABNORMAL LOW (ref 13.0–17.0)

## 2016-02-20 LAB — TYPE AND SCREEN
ABO/RH(D): O POS
ANTIBODY SCREEN: NEGATIVE

## 2016-02-20 LAB — PROTIME-INR
INR: 1.14
Prothrombin Time: 14.7 seconds (ref 11.4–15.2)

## 2016-02-20 LAB — LIPASE, BLOOD: LIPASE: 34 U/L (ref 11–51)

## 2016-02-20 MED ORDER — HYDRALAZINE HCL 20 MG/ML IJ SOLN
10.0000 mg | Freq: Four times a day (QID) | INTRAMUSCULAR | Status: DC | PRN
  Administered 2016-02-20: 10 mg via INTRAVENOUS
  Filled 2016-02-20: qty 1

## 2016-02-20 MED ORDER — MEMANTINE HCL 10 MG PO TABS
10.0000 mg | ORAL_TABLET | Freq: Two times a day (BID) | ORAL | Status: DC
  Administered 2016-02-20 – 2016-02-24 (×8): 10 mg via ORAL
  Filled 2016-02-20 (×8): qty 1

## 2016-02-20 MED ORDER — SODIUM CHLORIDE 0.9% FLUSH: 3.0000 mL | INTRAVENOUS | Status: DC | PRN

## 2016-02-20 MED ORDER — FAMOTIDINE IN NACL 20-0.9 MG/50ML-% IV SOLN
20.0000 mg | Freq: Two times a day (BID) | INTRAVENOUS | Status: DC
  Administered 2016-02-20 – 2016-02-23 (×6): 20 mg via INTRAVENOUS
  Filled 2016-02-20 (×7): qty 50

## 2016-02-20 MED ORDER — TRAZODONE HCL 50 MG PO TABS: 50.0000 mg | ORAL_TABLET | Freq: Every evening | ORAL | Status: DC | PRN

## 2016-02-20 MED ORDER — ASPIRIN EC 81 MG PO TBEC
81.0000 mg | DELAYED_RELEASE_TABLET | Freq: Every day | ORAL | Status: DC
  Administered 2016-02-21: 81 mg via ORAL
  Filled 2016-02-20: qty 1

## 2016-02-20 MED ORDER — COLESTIPOL HCL 5 G PO PACK
5.0000 g | PACK | Freq: Two times a day (BID) | ORAL | Status: DC
  Administered 2016-02-20 – 2016-02-24 (×7): 5 g via ORAL
  Filled 2016-02-20 (×10): qty 1

## 2016-02-20 MED ORDER — ALLOPURINOL 300 MG PO TABS
300.0000 mg | ORAL_TABLET | Freq: Every day | ORAL | Status: DC
  Administered 2016-02-21 – 2016-02-24 (×4): 300 mg via ORAL
  Filled 2016-02-20 (×4): qty 1

## 2016-02-20 MED ORDER — TRAMADOL HCL 50 MG PO TABS: 50.0000 mg | ORAL_TABLET | Freq: Four times a day (QID) | ORAL | Status: DC | PRN

## 2016-02-20 MED ORDER — ALBUTEROL SULFATE (2.5 MG/3ML) 0.083% IN NEBU: 2.5000 mg | INHALATION_SOLUTION | RESPIRATORY_TRACT | Status: DC | PRN

## 2016-02-20 MED ORDER — ONDANSETRON HCL 4 MG PO TABS: 4.0000 mg | ORAL_TABLET | Freq: Four times a day (QID) | ORAL | Status: DC | PRN

## 2016-02-20 MED ORDER — ACETAMINOPHEN 650 MG RE SUPP: 650.0000 mg | Freq: Four times a day (QID) | RECTAL | Status: DC | PRN

## 2016-02-20 MED ORDER — SODIUM CHLORIDE 0.9% FLUSH
3.0000 mL | Freq: Two times a day (BID) | INTRAVENOUS | Status: DC
  Administered 2016-02-21 – 2016-02-24 (×2): 3 mL via INTRAVENOUS

## 2016-02-20 MED ORDER — SENNA 8.6 MG PO TABS
1.0000 | ORAL_TABLET | Freq: Two times a day (BID) | ORAL | Status: DC
  Administered 2016-02-20 – 2016-02-24 (×5): 8.6 mg via ORAL
  Filled 2016-02-20 (×6): qty 1

## 2016-02-20 MED ORDER — DONEPEZIL HCL 10 MG PO TABS
10.0000 mg | ORAL_TABLET | Freq: Every day | ORAL | Status: DC
  Administered 2016-02-20 – 2016-02-23 (×4): 10 mg via ORAL
  Filled 2016-02-20 (×4): qty 1

## 2016-02-20 MED ORDER — POLYETHYLENE GLYCOL 3350 17 G PO PACK
17.0000 g | PACK | Freq: Two times a day (BID) | ORAL | Status: DC
  Administered 2016-02-20 – 2016-02-24 (×5): 17 g via ORAL
  Filled 2016-02-20 (×6): qty 1

## 2016-02-20 MED ORDER — SODIUM CHLORIDE 0.9 % IV BOLUS (SEPSIS)
1000.0000 mL | Freq: Once | INTRAVENOUS | Status: AC
  Administered 2016-02-20: 1000 mL via INTRAVENOUS

## 2016-02-20 MED ORDER — SODIUM CHLORIDE 0.9% FLUSH
3.0000 mL | Freq: Two times a day (BID) | INTRAVENOUS | Status: DC
  Administered 2016-02-20 – 2016-02-23 (×7): 3 mL via INTRAVENOUS

## 2016-02-20 MED ORDER — ONDANSETRON HCL 4 MG/2ML IJ SOLN: 4.0000 mg | Freq: Four times a day (QID) | INTRAMUSCULAR | Status: DC | PRN

## 2016-02-20 MED ORDER — SODIUM CHLORIDE 0.9 % IV SOLN
INTRAVENOUS | Status: DC
  Administered 2016-02-20 (×2): via INTRAVENOUS

## 2016-02-20 MED ORDER — ACETAMINOPHEN 325 MG PO TABS: 650.0000 mg | ORAL_TABLET | Freq: Four times a day (QID) | ORAL | Status: DC | PRN

## 2016-02-20 MED ORDER — SODIUM CHLORIDE 0.9 % IV SOLN: 250.0000 mL | INTRAVENOUS | Status: DC | PRN

## 2016-02-20 MED ORDER — ONDANSETRON HCL 4 MG/2ML IJ SOLN
4.0000 mg | Freq: Once | INTRAMUSCULAR | Status: AC
  Administered 2016-02-20: 4 mg via INTRAVENOUS
  Filled 2016-02-20: qty 2

## 2016-02-20 MED ORDER — PANTOPRAZOLE SODIUM 40 MG IV SOLR
40.0000 mg | Freq: Two times a day (BID) | INTRAVENOUS | Status: DC
  Administered 2016-02-20 – 2016-02-23 (×6): 40 mg via INTRAVENOUS
  Filled 2016-02-20 (×6): qty 40

## 2016-02-20 NOTE — ED Notes (Signed)
Critical result to Beverly GustLitlle, MD

## 2016-02-20 NOTE — ED Triage Notes (Addendum)
[place] park [place] and experienced acute onset chest pain with near syncope and incontinence of bowels.  Initial hr was 64, irregular with frequent pvc's, cbg 126.  When placed in truck, pt became sb 40's, diaphoretic, projectile vomited.  VS 130/84, rr22, spot 91RA.  Pt with new onset dementia.

## 2016-02-20 NOTE — Consult Note (Addendum)
Referring Provider: Dr. Mariea Clonts Primary Care Physician:  Romero Belling, MD Primary Gastroenterologist:  Not sure   Reason for Consultation:  GI bleed  HPI: Anthony [name] is a 80 y.o. male was brought to the hospital for near syncope. Patient with baseline Alzheimer's dementia and most of the history is obtained from the daughter. According to her patient was in the park this morning when he noticed dizziness and syncope while standing up followed by urinary incontinence.EMS was called and patient was brought to the hospital where he had dark colored stool. According to daughter, they have not noticed any black color stool at home . Denied any bright red per rectum. Denied abdominal pain. Patient takes Aleve almost every day for knee pain. No prior upper endoscopy. Patient just had a bowel movement and it was dark brown color smear, no stool.  Last colonoscopy around 5-10 years ago. Does not know the name of physician. [facility] Medical records reviewed. Patient has not been seen by [facility] GI physician in the past.  Past Medical History:  Diagnosis Date  . ABNORMAL ELECTROCARDIOGRAM 05/03/2010  . Alzheimer disease   . DIABETES MELLITUS, TYPE II 03/15/2007  . GALLSTONES 03/15/2007  . GOUT 03/15/2007  . HYPERLIPIDEMIA 03/15/2007  . HYPERTENSION 03/15/2007  . NEPHROPATHY, DIABETIC 03/15/2007  . RENAL INSUFFICIENCY 04/30/2009  . SHOULDER PAIN, BILATERAL 04/29/2008  . UROLITHIASIS, HX OF 03/15/2007    Past Surgical History:  Procedure Laterality Date  . NASAL SEPTUM SURGERY  1978    Prior to Admission medications   Medication Sig Start Date End Date Taking? Authorizing Provider  allopurinol (ZYLOPRIM) 300 MG tablet TAKE ONE TABLET BY MOUTH ONCE DAILY Patient taking differently: Take 300 mg by mouth once daily 12/22/15  Yes Romero Belling, MD  aspirin 81 MG tablet Take 1 tablet (81 mg total) by mouth daily. 07/03/12  Yes Romero Belling, MD  colestipol (COLESTID) 5 g granules Take 5 g by mouth 2 (two)  times daily. 09/13/15  Yes Romero Belling, MD  donepezil (ARICEPT) 10 MG tablet TAKE ONE TABLET BY MOUTH AT BEDTIME Patient taking differently: Take 10 mg by mouth in the morning 02/17/16  Yes Romero Belling, MD  doxazosin (CARDURA) 4 MG tablet TAKE ONE TABLET BY MOUTH ONCE DAILY. PATIENT NEEDS APPOINTMENT FOR FUTHER REFILLS Patient taking differently: Take 4 mg by mouth daily.  08/25/15  Yes Romero Belling, MD  furosemide (LASIX) 20 MG tablet TAKE ONE TABLET BY MOUTH ONCE DAILY Patient taking differently: Take 20 mg by mouth once daily 12/27/15  Yes Romero Belling, MD  memantine (NAMENDA) 10 MG tablet TAKE ONE TABLET BY MOUTH TWICE DAILY Patient taking differently: Take 10 mg by mouth twice daily 01/03/16  Yes Romero Belling, MD  naproxen sodium (ANAPROX) 220 MG tablet Take 440 mg by mouth 2 (two) times daily as needed (for pain).   Yes Historical Provider, MD  Glucose Blood (BAYER BREEZE 2 TEST) DISK Use twice daily to check Blood sugars Patient not taking: Reported on 02/20/2016 08/18/13   Romero Belling, MD  losartan-hydrochlorothiazide (HYZAAR) 100-25 MG per tablet TAKE ONE TABLET BY MOUTH ONCE DAILY Patient not taking: Reported on 02/20/2016 02/23/15   Romero Belling, MD    Scheduled Meds: . [START ON 02/21/2016] allopurinol  300 mg Oral Daily  . [START ON 02/21/2016] aspirin EC  81 mg Oral Daily  . colestipol  5 g Oral BID  . donepezil  10 mg Oral QHS  . famotidine (PEPCID) IV  20 mg Intravenous Q12H  .  memantine  10 mg Oral BID  . pantoprazole (PROTONIX) IV  40 mg Intravenous Q12H  . polyethylene glycol  17 g Oral BID  . senna  1 tablet Oral BID  . sodium chloride  1,000 mL Intravenous Once  . sodium chloride flush  3 mL Intravenous Q12H  . sodium chloride flush  3 mL Intravenous Q12H   Continuous Infusions: . sodium chloride     PRN Meds:.sodium chloride, acetaminophen **OR** acetaminophen, albuterol, hydrALAZINE, ondansetron **OR** ondansetron (ZOFRAN) IV, sodium chloride flush, traMADol,  traZODone  Allergies as of 02/20/2016 - Review Complete 02/20/2016  Allergen Reaction Noted  . Amlodipine-atorvastatin Other (See Comments) 11/19/2006  . Benazepril hcl Other (See Comments)   . Ezetimibe Other (See Comments) 10/29/2009    Family History  Problem Relation Age of Onset  . Cancer Father     Prostate Cancer  . Colon cancer Neg Hx   . Esophageal cancer Neg Hx   . Stomach cancer Neg Hx     Social History   Social History  . Marital status: Divorced    Spouse name: N/A  . Number of children: N/A  . Years of education: N/A   Occupational History  . Retired    Social History Main Topics  . Smoking status: Never Smoker  . Smokeless tobacco: Never Used  . Alcohol use No  . Drug use: No  . Sexual activity: No   Other Topics Concern  . Not on file   Social History Narrative   NOK is Daughter Sivan Quast          Review of Systems: Not able to obtain secondary to dementia  Physical Exam: Vital signs: Vitals:   02/20/16 1600 02/20/16 1718  BP: 173/95 (!) 173/99  Pulse: 70 92  Resp: 15 18  Temp:  98.4 F (36.9 C)     General:   Alert,  Well-developed, well-nourished, pleasant and cooperative in NAD Lungs:  Clear throughout to auscultation.   No wheezes, crackles, or rhonchi. No acute distress. Heart:  Regular rate and rhythm; Occasional extra beats Abdomen: Soft, nontender, nondistended, bowel sounds present Lower extremity. No edema  GI:  Lab Results:  Recent Labs  02/20/16 1416  WBC 8.1  HGB 10.8*  HCT 34.5*  PLT PLATELET CLUMPS NOTED ON SMEAR, COUNT APPEARS ADEQUATE   BMET  Recent Labs  02/20/16 1416  NA 144  K 4.2  CL 114*  CO2 26  GLUCOSE 85  BUN 48*  CREATININE 1.85*  CALCIUM 8.7*   LFT  Recent Labs  02/20/16 1416  PROT 5.9*  ALBUMIN 3.0*  AST 19  ALT 11*  ALKPHOS 62  BILITOT 0.7   PT/INR  Recent Labs  02/20/16 1449  LABPROT 14.7  INR 1.14     Studies/Results: Dg Chest Portable 1 View  Result  Date: 02/20/2016 CLINICAL DATA:  Shortness of breath for approximately 1 year. Status post fall today. EXAM: PORTABLE CHEST 1 VIEW COMPARISON:  PA and lateral chest 09/10/2015 and 10/16/2013. FINDINGS: Lung volumes are low but the lungs are clear. Heart size is normal. No pneumothorax or pleural effusion. No focal bony abnormality. IMPRESSION: No acute disease. Electronically Signed   By: Drusilla Kanner M.D.   On: 02/20/2016 14:26    Impression/Plan: Dark stools ?? Melena. : Hemoglobin dropped to 10.8 from baseline 13 Occult blood positive. In setting of dark stools Syncopal episode. Resolved. Bradycardia. Resolved. Hypertension Chronic kidney disease Dementia  Recommendations ------------------------ Continue twice a day PPI for now.  Monitor H&H. Consider EGD for further evaluation if evidence of ongoing bleeding or drop in hemoglobin. . Patient with irregular heart rate with heart rate ranging from 50 to 100.  GI will follow. Plan discussed with both daughters in detail. Verbalized understanding.   LOS: 0 days   Lashawn Bromwell  02/20/2016, 5:51 PM  Pager [phone] If no answer or after 5 PM call [phone]

## 2016-02-20 NOTE — H&P (Signed)
Patient Demographics:    Anthony Bottarnest Caster, is a 80 y.o. male  MRN: [id number]   DOB - [birth year]  Admit Date - 02/20/2016  Outpatient Primary MD for the patient is Romero BellingELLISON, SEAN, MD   Assessment & Plan:    Principal Problem:   Near syncope Active Problems:   HTN (hypertension)   Melena   GI bleed   GI bleed due to NSAIDs    1)Melena- hemoglobin has dropped to 10.8 from a baseline of 13, suspect upper GI bleed in a patient with routine use of Aleve, Protonix 40 mg IV twice a day, discussed with Dr. Levora AngelBrahmbhatt who will see patient for possible endoluminal evaluation. Check serial H&H and transfuse as clinically indicated, no hypotension or tachycardia at this time.   2)Symptomatic Anemia with near syncope/chest discomfort-secondary to acute GI blood loss as above in #1 we will cycle troponins, baseline hemoglobin is 13, today hemoglobin is already down to 10.8 prior to hydration . Hydrate gently. Transfuse as clinically indicated.   3)HTN- hold losartan HCTZ until patient is euvolemic,   may use IV Hydralazine 10 mg  Every 4 hours Prn for systolic blood pressure over 160 mmhg  4)Dementia with moderate cognitive deficits-continue Aricept and Nameda, no significant behavioral disturbance  5)Social/Ethics- plan of care discussed with daughter Memory Arguengela Angelucci and son at bedside, patient is a full code   6)CKD -At baseline patient has secured a stage III, his current creatinine and GFR is pretty close to his baseline, hydrate gently, avoid NSAIDs and other nephrotoxic agents   With History of - Reviewed by me  Past Medical History:  Diagnosis Date  . ABNORMAL ELECTROCARDIOGRAM 05/03/2010  . Alzheimer disease   . DIABETES MELLITUS, TYPE II 03/15/2007  . GALLSTONES 03/15/2007  . GOUT 03/15/2007  .  HYPERLIPIDEMIA 03/15/2007  . HYPERTENSION 03/15/2007  . NEPHROPATHY, DIABETIC 03/15/2007  . RENAL INSUFFICIENCY 04/30/2009  . SHOULDER PAIN, BILATERAL 04/29/2008  . UROLITHIASIS, HX OF 03/15/2007      Past Surgical History:  Procedure Laterality Date  . NASAL SEPTUM SURGERY  1978      Chief Complaint  Patient presents with  . Near Syncope  . Chest Pain      HPI:    Anthony [name]  is a 80 y.o. male, With episode of dizziness and near-syncope accompanied by emesis without coughing grounds subsequently patient had melanotic stools x1 . Baseline hemoglobin usually 13, today hemoglobin is down 10.8. Patient takes Aleve at least 5 out of 7 days a week for arthritis and gouty troponin type pain. Denies abdominal pain dizziness or shortness of breath chest pains palpitations at this time, patient is not a great historian so additional history obtained from patient's daughter Marylene Landngela and patient's son at bedside. He did not fall and no frank syncope. Patient apparently was holding onto his chest but he never really complain of chest pain at any time.  Review of systems:    In addition to the HPI above,   A full 12 point Review of Systems was done, except as stated above, all other Review of Systems were negative.    Social History:  Reviewed by me    Social History  Substance Use Topics  . Smoking status: Never Smoker  . Smokeless tobacco: Never Used  . Alcohol use No       Family History :  Reviewed by me    Family History  Problem Relation Age of Onset  . Cancer Father     Prostate Cancer  . Colon cancer Neg Hx   . Esophageal cancer Neg Hx   . Stomach cancer Neg Hx      Home Medications:   Prior to Admission medications   Medication Sig Start Date End Date Taking? Authorizing Provider  allopurinol (ZYLOPRIM) 300 MG tablet TAKE ONE TABLET BY MOUTH ONCE DAILY Patient taking differently: Take 300 mg by mouth once daily 12/22/15  Yes Romero Belling, MD  aspirin 81 MG  tablet Take 1 tablet (81 mg total) by mouth daily. 07/03/12  Yes Romero Belling, MD  colestipol (COLESTID) 5 g granules Take 5 g by mouth 2 (two) times daily. 09/13/15  Yes Romero Belling, MD  donepezil (ARICEPT) 10 MG tablet TAKE ONE TABLET BY MOUTH AT BEDTIME Patient taking differently: Take 10 mg by mouth in the morning 02/17/16  Yes Romero Belling, MD  doxazosin (CARDURA) 4 MG tablet TAKE ONE TABLET BY MOUTH ONCE DAILY. PATIENT NEEDS APPOINTMENT FOR FUTHER REFILLS Patient taking differently: Take 4 mg by mouth daily.  08/25/15  Yes Romero Belling, MD  furosemide (LASIX) 20 MG tablet TAKE ONE TABLET BY MOUTH ONCE DAILY Patient taking differently: Take 20 mg by mouth once daily 12/27/15  Yes Romero Belling, MD  memantine (NAMENDA) 10 MG tablet TAKE ONE TABLET BY MOUTH TWICE DAILY Patient taking differently: Take 10 mg by mouth twice daily 01/03/16  Yes Romero Belling, MD  naproxen sodium (ANAPROX) 220 MG tablet Take 440 mg by mouth 2 (two) times daily as needed (for pain).   Yes Historical Provider, MD  Glucose Blood (BAYER BREEZE 2 TEST) DISK Use twice daily to check Blood sugars Patient not taking: Reported on 02/20/2016 08/18/13   Romero Belling, MD  losartan-hydrochlorothiazide (HYZAAR) 100-25 MG per tablet TAKE ONE TABLET BY MOUTH ONCE DAILY Patient not taking: Reported on 02/20/2016 02/23/15   Romero Belling, MD     Allergies:     Allergies  Allergen Reactions  . Amlodipine-Atorvastatin Other (See Comments)    REACTION: LEG CRAMPS  . Benazepril Hcl Other (See Comments)    REACTION: Cough  . Ezetimibe Other (See Comments)    REACTION: edema     Physical Exam:   Vitals  Blood pressure 173/95, pulse 70, temperature 97.8 F (36.6 C), temperature source Oral, resp. rate 15, height 5\' 7"  (1.702 m), weight 85.7 kg (189 lb), SpO2 100 %.  Physical Examination: General appearance - alert, well appearing, and in no distress  Mental status - alert, oriented to person, Some cognitive deficits obvious  Eyes -  sclera anicteric Neck - supple, no JVD elevation , Chest - clear  to auscultation bilaterally, symmetrical air movement,  Heart - S1 and S2 normal, 3/6 murmur Abdomen - soft, nontender, nondistended, no masses or organomegaly, no CVA area tenderness Neurological - screening mental status exam normal, neck supple without rigidity, cranial nerves II through XII intact, DTR's normal and symmetric Extremities -  no pedal edema noted, intact peripheral pulses  Skin - warm, dry    Data Review:    CBC  Recent Labs Lab 02/20/16 1416  WBC 8.1  HGB 10.8*  HCT 34.5*  PLT PLATELET CLUMPS NOTED ON SMEAR, COUNT APPEARS ADEQUATE  MCV 81.2  MCH 25.4*  MCHC 31.3  RDW 14.7   ------------------------------------------------------------------------------------------------------------------  Chemistries   Recent Labs Lab 02/20/16 1416  NA 144  K 4.2  CL 114*  CO2 26  GLUCOSE 85  BUN 48*  CREATININE 1.85*  CALCIUM 8.7*  AST 19  ALT 11*  ALKPHOS 62  BILITOT 0.7   ------------------------------------------------------------------------------------------------------------------ estimated creatinine clearance is 34.4 mL/min (by C-G formula based on SCr of 1.85 mg/dL). ------------------------------------------------------------------------------------------------------------------ No results for input(s): TSH, T4TOTAL, T3FREE, THYROIDAB in the last 72 hours.  Invalid input(s): FREET3   Coagulation profile  Recent Labs Lab 02/20/16 1449  INR 1.14   ------------------------------------------------------------------------------------------------------------------- No results for input(s): DDIMER in the last 72 hours. -------------------------------------------------------------------------------------------------------------------  Cardiac Enzymes No results for input(s): CKMB, TROPONINI, MYOGLOBIN in the last 168 hours.  Invalid input(s):  CK ------------------------------------------------------------------------------------------------------------------ No results found for: BNP   ---------------------------------------------------------------------------------------------------------------  Urinalysis    Component Value Date/Time   COLORURINE YELLOW 09/10/2015 1146   APPEARANCEUR CLEAR 09/10/2015 1146   LABSPEC 1.020 09/10/2015 1146   PHURINE 6.0 09/10/2015 1146   GLUCOSEU NEGATIVE 09/10/2015 1146   HGBUR NEGATIVE 09/10/2015 1146   BILIRUBINUR NEGATIVE 09/10/2015 1146   KETONESUR NEGATIVE 09/10/2015 1146   PROTEINUR 100 (A) 09/02/2013 1403   UROBILINOGEN 0.2 09/10/2015 1146   NITRITE NEGATIVE 09/10/2015 1146   LEUKOCYTESUR NEGATIVE 09/10/2015 1146    ----------------------------------------------------------------------------------------------------------------   Imaging Results:    Dg Chest Portable 1 View  Result Date: 02/20/2016 CLINICAL DATA:  Shortness of breath for approximately 1 year. Status post fall today. EXAM: PORTABLE CHEST 1 VIEW COMPARISON:  PA and lateral chest 09/10/2015 and 10/16/2013. FINDINGS: Lung volumes are low but the lungs are clear. Heart size is normal. No pneumothorax or pleural effusion. No focal bony abnormality. IMPRESSION: No acute disease. Electronically Signed   By: Drusilla Kanner M.D.   On: 02/20/2016 14:26    Radiological Exams on Admission: Dg Chest Portable 1 View  Result Date: 02/20/2016 CLINICAL DATA:  Shortness of breath for approximately 1 year. Status post fall today. EXAM: PORTABLE CHEST 1 VIEW COMPARISON:  PA and lateral chest 09/10/2015 and 10/16/2013. FINDINGS: Lung volumes are low but the lungs are clear. Heart size is normal. No pneumothorax or pleural effusion. No focal bony abnormality. IMPRESSION: No acute disease. Electronically Signed   By: Drusilla Kanner M.D.   On: 02/20/2016 14:26    DVT Prophylaxis SCD/TEDS  AM Labs Ordered, also please review Full  Orders  Family Communication: Admission, patients condition and plan of care including tests being ordered have been discussed with the patient and daughter and son  who indicate understanding and agree with the plan   Code Status - Full Code  Likely DC to  Home with family  Condition   stable  Ogle Hoeffner M.D on 02/20/2016 at 5:03 PM   Between 7am to 7pm - Pager - [phone]  After 7pm go to [email or web address] - password [email or web address]  [facility] - Office  [phone]  Dragon dictation system was used to create this note, attempts have been made to correct errors, however presence of uncorrected errors is not a reflection quality of care provided.

## 2016-02-20 NOTE — Progress Notes (Signed)
Lynnette Caffeyarnest E Aldape [id number] Admission Data: 02/20/2016 6:55 PM Attending Provider: Shon Haleourage Emokpae, MD  WUJ:[id number]:ELLISON, Gregary SignsSEAN, MD Consults/ Treatment Team:   Lynnette Caffeyarnest E Knoth is a 80 y.o. male patient admitted from ED awake, alert  & orientated  X 3,  Full Code, VSS - Blood pressure (!) 173/99, pulse 92, temperature 98.4 F (36.9 C), temperature source Oral, resp. rate 18, height 5\' 7"  (1.702 m), weight 85.7 kg (189 lb), SpO2 100 %.,  no c/o shortness of breath, no c/o chest pain, no distress noted. Tele # 25 placed and pt is currently running:sinus tachycardia.   IV site WDL:  hand left, condition patent and no redness and antecubital left, condition patent and no redness with a transparent dsg that's clean dry and intact.  Allergies:   Allergies  Allergen Reactions  . Amlodipine-Atorvastatin Other (See Comments)    REACTION: LEG CRAMPS  . Benazepril Hcl Other (See Comments)    REACTION: Cough  . Ezetimibe Other (See Comments)    REACTION: edema     Past Medical History:  Diagnosis Date  . ABNORMAL ELECTROCARDIOGRAM 05/03/2010  . Alzheimer disease   . DIABETES MELLITUS, TYPE II 03/15/2007  . GALLSTONES 03/15/2007  . GOUT 03/15/2007  . HYPERLIPIDEMIA 03/15/2007  . HYPERTENSION 03/15/2007  . NEPHROPATHY, DIABETIC 03/15/2007  . RENAL INSUFFICIENCY 04/30/2009  . SHOULDER PAIN, BILATERAL 04/29/2008  . UROLITHIASIS, HX OF 03/15/2007    History:  obtained from chart review. Tobacco/alcohol: denied none  Pt orientation to unit, room and routine. Information packet given to patient/family and safety video watched.  Admission INP armband ID verified with patient/family, and in place. SR up x 2, fall risk assessment complete with Patient and family verbalizing understanding of risks associated with falls. Pt verbalizes an understanding of how to use the call bell and to call for help before getting out of bed.  Skin, clean-dry- intact without evidence of bruising, or skin tears.   No evidence of  skin break down noted on exam. color normal, vascularity normal, no rashes or suspicious lesions, no evidence of bleeding or bruising, no lesions noted, no rash, no edema, temperature normal, texture normal, mobility and turgor normal    Will cont to monitor and assist as needed.  Camillo FlamingVicki L Shelise Maron, RN 02/20/2016 6:55 PM

## 2016-02-20 NOTE — ED Provider Notes (Signed)
[facility]-EMERGENCY DEPT Provider Note   CSN: [id number] Arrival date & time: 02/20/16  1337     History   Chief Complaint Chief Complaint  Patient presents with  . Near Syncope  . Chest Pain    HPI Anthony [name] is a 80 y.o. male.  80 year old male with past medical history including dementia, type 2 diabetes mellitus who presents with vomiting and bradycardia. History obtained from daughter and EMS. Daughter reports that the patient was sitting on a park bench watching his grandchildren play and stood up. He quickly sat back down and grabbed his central chest. He became sweaty and his face winced so his daughter called EMS. When EMS loaded the patient into the stretcher, he became bradycardic and vomited. They noted that he had had an episode of fecal incontinence. His bradycardia quickly resolved without any medications during transport. Daughter states that the patient never actually complained of chest pain but he does have a history of dementia. Currently the patient denies any pain. Daughter states that he has not had any recent illness and ate a normal lunch earlier today.  LEVEL 5 CAVEAT DUE TO DEMENTIA   The history is provided by the EMS personnel and a relative.  Near Syncope  Associated symptoms include chest pain.  Chest Pain   Associated symptoms include near-syncope.    Past Medical History:  Diagnosis Date  . ABNORMAL ELECTROCARDIOGRAM 05/03/2010  . DIABETES MELLITUS, TYPE II 03/15/2007  . GALLSTONES 03/15/2007  . GOUT 03/15/2007  . HYPERLIPIDEMIA 03/15/2007  . HYPERTENSION 03/15/2007  . NEPHROPATHY, DIABETIC 03/15/2007  . RENAL INSUFFICIENCY 04/30/2009  . SHOULDER PAIN, BILATERAL 04/29/2008  . UROLITHIASIS, HX OF 03/15/2007    Patient Active Problem List   Diagnosis Date Noted  . Abnormal breath sounds 09/10/2015  . Moderate dementia without behavioral disturbance 12/04/2014  . Wellness examination 09/02/2014  . Hyperglycemia 05/08/2014  . Breath sounds,  abnormal 10/16/2013  . Pain in joint, lower leg 09/16/2013  . Memory loss 09/02/2013  . Edema 09/03/2012  . Encounter for long-term (current) use of other medications 05/05/2011  . Screening for prostate cancer 05/05/2011  . Screening for malignant neoplasm 05/05/2011  . Special screening for malignant neoplasm of prostate 05/05/2011  . Anemia, unspecified 10/28/2010  . Routine general medical examination at a health care facility 10/28/2010  . ABNORMAL ELECTROCARDIOGRAM 05/03/2010  . THROMBOCYTOPENIA 04/30/2009  . Disorder resulting from impaired renal function 04/30/2009  . SHOULDER PAIN, BILATERAL 04/29/2008  . NEPHROPATHY, DIABETIC 03/15/2007  . Dyslipidemia 03/15/2007  . Gout 03/15/2007  . HTN (hypertension) 03/15/2007  . GALLSTONES 03/15/2007  . UROLITHIASIS, HX OF 03/15/2007    Past Surgical History:  Procedure Laterality Date  . NASAL SEPTUM SURGERY  1978       Home Medications    Prior to Admission medications   Medication Sig Start Date End Date Taking? Authorizing Provider  allopurinol (ZYLOPRIM) 300 MG tablet TAKE ONE TABLET BY MOUTH ONCE DAILY 12/22/15   Romero Belling, MD  allopurinol (ZYLOPRIM) 300 MG tablet TAKE ONE TABLET BY MOUTH ONCE DAILY 02/17/16   Romero Belling, MD  aspirin 81 MG tablet Take 1 tablet (81 mg total) by mouth daily. 07/03/12   Romero Belling, MD  colestipol (COLESTID) 5 g granules Take 5 g by mouth 2 (two) times daily. 09/13/15   Romero Belling, MD  donepezil (ARICEPT) 10 MG tablet TAKE ONE TABLET BY MOUTH AT BEDTIME 09/28/14   Romero Belling, MD  donepezil (ARICEPT) 10 MG tablet TAKE ONE TABLET  BY MOUTH AT BEDTIME 12/22/15   Romero Belling, MD  donepezil (ARICEPT) 10 MG tablet TAKE ONE TABLET BY MOUTH AT BEDTIME 02/17/16   Romero Belling, MD  doxazosin (CARDURA) 4 MG tablet TAKE ONE TABLET BY MOUTH ONCE DAILY. PATIENT NEEDS APPOINTMENT FOR FUTHER REFILLS 08/25/15   Romero Belling, MD  doxazosin (CARDURA) 4 MG tablet TAKE ONE TABLET BY MOUTH ONCE DAILY *NEED APPT  FOR FURTHER REFILLS* 01/20/16   Romero Belling, MD  furosemide (LASIX) 20 MG tablet TAKE ONE TABLET BY MOUTH ONCE DAILY 12/27/15   Romero Belling, MD  Glucose Blood (BAYER BREEZE 2 TEST) DISK Use twice daily to check Blood sugars 08/18/13   Romero Belling, MD  losartan-hydrochlorothiazide (HYZAAR) 100-25 MG per tablet TAKE ONE TABLET BY MOUTH ONCE DAILY 02/23/15   Romero Belling, MD  memantine (NAMENDA) 10 MG tablet TAKE ONE TABLET BY MOUTH TWICE DAILY 01/03/16   Romero Belling, MD    Family History Family History  Problem Relation Age of Onset  . Cancer Father     Prostate Cancer  . Colon cancer Neg Hx   . Esophageal cancer Neg Hx   . Stomach cancer Neg Hx     Social History Social History  Substance Use Topics  . Smoking status: Never Smoker  . Smokeless tobacco: Never Used  . Alcohol use No     Allergies   Amlodipine-atorvastatin; Benazepril hcl; and Ezetimibe   Review of Systems Review of Systems  Unable to perform ROS: Dementia  Cardiovascular: Positive for chest pain and near-syncope.     Physical Exam Updated Vital Signs BP 130/84 (BP Location: Right Arm)   Pulse (!) 54   Temp 97.8 F (36.6 C) (Oral)   Resp 16   SpO2 99%   Physical Exam  Constitutional: He appears well-developed and well-nourished. No distress.  Emesis on clothes  HENT:  Head: Normocephalic and atraumatic.  Moist mucous membranes  Eyes: Conjunctivae are normal. Pupils are equal, round, and reactive to light.  Neck: Neck supple.  Cardiovascular: Regular rhythm.  Bradycardia present.   Murmur heard.  Systolic murmur is present with a grade of 3/6  Pulmonary/Chest: Effort normal and breath sounds normal.  Abdominal: Soft. Bowel sounds are normal. He exhibits no distension. There is no tenderness.  Musculoskeletal: He exhibits no edema.  Neurological: He is alert.  Oriented to person, following commands  Skin: Skin is warm and dry.  Psychiatric:  Calm, cooperative  Nursing note and vitals  reviewed.    ED Treatments / Results  Labs (all labs ordered are listed, but only abnormal results are displayed) Labs Reviewed  CBC  COMPREHENSIVE METABOLIC PANEL  LIPASE, BLOOD  I-STAT TROPOININ, ED    EKG  EKG Interpretation  Date/Time:  Sunday February 20 2016 13:53:49 EDT Ventricular Rate:  55 PR Interval:  260 QRS Duration: 88 QT Interval:  442 QTC Calculation: 422 R Axis:   -49 Text Interpretation:  Sinus bradycardia with 1st degree A-V block Left axis deviation Possible Anterior infarct , age undetermined T wave abnormality, consider lateral ischemia Abnormal ECG T wave inversions In lateral leads, no previous available for comparison Confirmed by LITTLE MD, RACHEL ([id number]) on 02/20/2016 2:01:08 PM       Radiology No results found.  Procedures Procedures (including critical care time)  Medications Ordered in ED Medications  ondansetron (ZOFRAN) injection 4 mg (not administered)     Initial Impression / Assessment and Plan / ED Course  I have reviewed the triage vital signs and  the nursing notes.  Pertinent labs & imaging results that were available during my care of the patient were reviewed by me and considered in my medical decision making (see chart for details).  Clinical Course   Patient presents after an episode of diaphoresis and grabbing chest followed by bradycardia and vomiting noted by EMS. He was awake and alert with normal BP, heart rate in the 50s, sinus rhythm. EKG shows some T-wave inversions but we have no previous available for comparison. The patient denied any complaints. Obtained above lab work as well as chest x-ray.  Shortly after patient's arrival, I was called to the room because he had a large bowel movement that was melanic, Hemoccult positive. His labs show hemoglobin 10.8 which is down from his baseline of 13. BUN elevated at 48, creatinine slightly elevated from baseline at 1.85 today. Daughter states that the patient does not take  chronic NSAIDs, is not drink alcohol, and has not had a history of GI bleeding. He has remained comfortable with reassuring vital signs here. Chest x-ray negative acute. Discussed admission with [facility] hospitalist, Dr. Mariea ClontsEmokpae, and pt admitted for further care.  Final Clinical Impressions(s) / ED Diagnoses   Final diagnoses:  Bradycardia  Gastrointestinal hemorrhage with melena  AKI (acute kidney injury) [facility](HCC)    New Prescriptions New Prescriptions   No medications on file     Laurence Spatesachel Morgan Little, MD 02/20/16 1621

## 2016-02-20 NOTE — ED Notes (Signed)
This RN removed the pts pants and found black tarry stool in the pts pants. The pt stated that he did not remember ever having a tarry stool before. Dr. Clarene DukeLittle made aware and at the bedside.

## 2016-02-21 LAB — CBC
HEMATOCRIT: 31 % — AB (ref 39.0–52.0)
HEMATOCRIT: 31.8 % — AB (ref 39.0–52.0)
HEMOGLOBIN: 10.2 g/dL — AB (ref 13.0–17.0)
Hemoglobin: 9.9 g/dL — ABNORMAL LOW (ref 13.0–17.0)
MCH: 25.4 pg — ABNORMAL LOW (ref 26.0–34.0)
MCH: 25.8 pg — AB (ref 26.0–34.0)
MCHC: 31.9 g/dL (ref 30.0–36.0)
MCHC: 32.1 g/dL (ref 30.0–36.0)
MCV: 79.7 fL (ref 78.0–100.0)
MCV: 80.5 fL (ref 78.0–100.0)
PLATELETS: 93 10*3/uL — AB (ref 150–400)
Platelets: 96 10*3/uL — ABNORMAL LOW (ref 150–400)
RBC: 3.89 MIL/uL — AB (ref 4.22–5.81)
RBC: 3.95 MIL/uL — ABNORMAL LOW (ref 4.22–5.81)
RDW: 14.5 % (ref 11.5–15.5)
RDW: 14.8 % (ref 11.5–15.5)
WBC: 11.8 10*3/uL — ABNORMAL HIGH (ref 4.0–10.5)
WBC: 8.7 10*3/uL (ref 4.0–10.5)

## 2016-02-21 LAB — BASIC METABOLIC PANEL
Anion gap: 5 (ref 5–15)
BUN: 31 mg/dL — AB (ref 6–20)
CO2: 25 mmol/L (ref 22–32)
Calcium: 8.5 mg/dL — ABNORMAL LOW (ref 8.9–10.3)
Chloride: 114 mmol/L — ABNORMAL HIGH (ref 101–111)
Creatinine, Ser: 1.66 mg/dL — ABNORMAL HIGH (ref 0.61–1.24)
GFR, EST AFRICAN AMERICAN: 44 mL/min — AB (ref >60)
GFR, EST NON AFRICAN AMERICAN: 38 mL/min — AB (ref >60)
Glucose, Bld: 95 mg/dL (ref 65–99)
POTASSIUM: 3.5 mmol/L (ref 3.5–5.1)
SODIUM: 144 mmol/L (ref 135–145)

## 2016-02-21 LAB — TROPONIN I: TROPONIN I: 0.05 ng/mL — AB (ref <0.03)

## 2016-02-21 LAB — HEMOGLOBIN AND HEMATOCRIT, BLOOD
HCT: 32.1 % — ABNORMAL LOW (ref 39.0–52.0)
HEMOGLOBIN: 10 g/dL — AB (ref 13.0–17.0)

## 2016-02-21 MED ORDER — ISOSORBIDE MONONITRATE ER 30 MG PO TB24
30.0000 mg | ORAL_TABLET | Freq: Every day | ORAL | Status: DC
  Administered 2016-02-21 – 2016-02-24 (×4): 30 mg via ORAL
  Filled 2016-02-21 (×4): qty 1

## 2016-02-21 MED ORDER — LOSARTAN POTASSIUM 50 MG PO TABS: 100.0000 mg | ORAL_TABLET | Freq: Every day | ORAL | Status: DC

## 2016-02-21 NOTE — Progress Notes (Signed)
PROGRESS NOTE    Lynnette Caffeyarnest E Golding  UEA:[id number] DOB: 18-Sep-1935 DOA: 02/20/2016 PCP: Romero BellingELLISON, SEAN, MD   Brief Narrative:  Mr Anthony [name] is a pleasant 80 year old gentleman with a past medical history of cognitive impairment, on aspirin therapy, admitted to the medicine service on 02/20/2016 presented with dark stools. Family members reporting that he is on Aleve as needed however had been taking it daily in the past week for arthritic pain. Initial lab work revealed a hemoglobin of 10.8, had been 12.9 earlier this year. Case discussed with GI who recommended a conservative approach. Continue PPI and advance his diet.    Assessment & Plan:   Principal Problem:   Near syncope Active Problems:   HTN (hypertension)   Melena   GI bleed   GI bleed due to NSAIDs  1.  Suspected Upper GI Bleed.  -Mr Anthony [name] is a 80 year old presenting with black tarry stools, has not had any further episodes since hospitalization. -Lab work showing hemoglobin of 9.9 this morning, down from 10.8 on admission -I spoke with GI regarding EGD, Dr Levora AngelBrahmbhatt who recommended conservative management. -Plan to continue Protonix 40 mg by mouth twice a day. Stop ASA. Family educated on not taking NSAIDS.  -Repeat labs in a.m.  2.  Hypertension -His blood pressures are elevated, family reporting that he had not been on antihypertensives lately. Records showing allergy to ARB's and Amlodipine.   -Will start Imdur 30 mg PO q daily  3.  Dementia -HE remains stable, functioning at his baseline -Will continue Aricept and Namenda  DVT prophylaxis: SCD's Code Status: Full Code Family Communication: I spoke with his daughter Disposition Plan: Possible discharge home in the next 24 hours  Consultants:   GI  Subjective: He is pleasantly confused, has no complaints  Objective: Vitals:   02/20/16 2123 02/20/16 2201 02/20/16 2235 02/21/16 0445  BP: (!) 154/106 (!) 170/91 (!) 150/77 (!) 151/88  Pulse: 77 77 97 93    Resp: 18   18  Temp: 98.7 F (37.1 C)   98.4 F (36.9 C)  TempSrc: Oral   Oral  SpO2: 100%   100%  Weight:      Height:        Intake/Output Summary (Last 24 hours) at 02/21/16 1012 Last data filed at 02/21/16 0924  Gross per 24 hour  Intake          1966.67 ml  Output             1200 ml  Net           766.67 ml   Filed Weights   02/20/16 1415  Weight: 85.7 kg (189 lb)    Examination:  General exam: Appears calm and comfortable, NAD  Respiratory system: Clear to auscultation. Respiratory effort normal. Cardiovascular system: S1 & S2 heard, RRR. No JVD, murmurs, rubs, gallops or clicks. No pedal edema. Gastrointestinal system: Abdomen is nondistended, soft and nontender. No organomegaly or masses felt. Normal bowel sounds heard. Central nervous system: Alert and oriented. No focal neurological deficits. Extremities: Symmetric 5 x 5 power. Skin: No rashes, lesions or ulcers Psychiatry: H/o of cognitive impairment, no acute distress    Data Reviewed: I have personally reviewed following labs and imaging studies  CBC:  Recent Labs Lab 02/20/16 1416 02/20/16 1846 02/20/16 2335 02/21/16 0617  WBC 8.1  --  11.8* 8.7  HGB 10.8* 10.6* 10.2* 9.9*  HCT 34.5* 34.2* 31.8* 31.0*  MCV 81.2  --  80.5 79.7  PLT PLATELET CLUMPS NOTED ON SMEAR, COUNT APPEARS ADEQUATE  --  96* 93*   Basic Metabolic Panel:  Recent Labs Lab 02/20/16 1416 02/21/16 0617  NA 144 144  K 4.2 3.5  CL 114* 114*  CO2 26 25  GLUCOSE 85 95  BUN 48* 31*  CREATININE 1.85* 1.66*  CALCIUM 8.7* 8.5*   GFR: Estimated Creatinine Clearance: 38.3 mL/min (by C-G formula based on SCr of 1.66 mg/dL). Liver Function Tests:  Recent Labs Lab 02/20/16 1416  AST 19  ALT 11*  ALKPHOS 62  BILITOT 0.7  PROT 5.9*  ALBUMIN 3.0*    Recent Labs Lab 02/20/16 1416  LIPASE 34   No results for input(s): AMMONIA in the last 168 hours. Coagulation Profile:  Recent Labs Lab 02/20/16 1449  INR 1.14    Cardiac Enzymes:  Recent Labs Lab 02/20/16 2335  TROPONINI 0.05*   BNP (last 3 results) No results for input(s): PROBNP in the last 8760 hours. HbA1C: No results for input(s): HGBA1C in the last 72 hours. CBG: No results for input(s): GLUCAP in the last 168 hours. Lipid Profile: No results for input(s): CHOL, HDL, LDLCALC, TRIG, CHOLHDL, LDLDIRECT in the last 72 hours. Thyroid Function Tests: No results for input(s): TSH, T4TOTAL, FREET4, T3FREE, THYROIDAB in the last 72 hours. Anemia Panel: No results for input(s): VITAMINB12, FOLATE, FERRITIN, TIBC, IRON, RETICCTPCT in the last 72 hours. Sepsis Labs: No results for input(s): PROCALCITON, LATICACIDVEN in the last 168 hours.  No results found for this or any previous visit (from the past 240 hour(s)).       Radiology Studies: Dg Chest Portable 1 View  Result Date: 02/20/2016 CLINICAL DATA:  Shortness of breath for approximately 1 year. Status post fall today. EXAM: PORTABLE CHEST 1 VIEW COMPARISON:  PA and lateral chest 09/10/2015 and 10/16/2013. FINDINGS: Lung volumes are low but the lungs are clear. Heart size is normal. No pneumothorax or pleural effusion. No focal bony abnormality. IMPRESSION: No acute disease. Electronically Signed   By: Drusilla Kanner M.D.   On: 02/20/2016 14:26        Scheduled Meds: . allopurinol  300 mg Oral Daily  . aspirin EC  81 mg Oral Daily  . colestipol  5 g Oral BID  . donepezil  10 mg Oral QHS  . famotidine (PEPCID) IV  20 mg Intravenous Q12H  . memantine  10 mg Oral BID  . pantoprazole (PROTONIX) IV  40 mg Intravenous Q12H  . polyethylene glycol  17 g Oral BID  . senna  1 tablet Oral BID  . sodium chloride flush  3 mL Intravenous Q12H  . sodium chloride flush  3 mL Intravenous Q12H   Continuous Infusions: . sodium chloride 100 mL/hr at 02/20/16 2033     LOS: 1 day    Time spent: 25 min    Jeralyn Bennett, MD [facility] Pager [phone]  If 7PM-7AM,  please contact night-coverage [email or web address] Password [email or web address] 02/21/2016, 10:12 AM

## 2016-02-21 NOTE — Progress Notes (Signed)
[facility]  Anthony [name] 80 y.o. Dec 22, 1935   Subjective: Daughter at bedside. Not aware of any bleeding episode.  Objective: Vital signs in last 24 hours: Vitals:   02/21/16 1158 02/21/16 1201  BP: (!) 171/97 (!) 187/100  Pulse: 83 92  Resp: 18 20  Temp:      Physical Exam:  General:   Alert,  Well-developed, well-nourished, pleasant and cooperative in NAD Lungs:  Clear throughout to auscultation.   No wheezes, crackles, or rhonchi. No acute distress. Heart:  Regular rate and rhythm; Occasional extra beats Abdomen: Soft, nontender, nondistended, bowel sounds present Lower extremity. No edema                            Lab Results:  Recent Labs  02/20/16 1416 02/21/16 0617  NA 144 144  K 4.2 3.5  CL 114* 114*  CO2 26 25  GLUCOSE 85 95  BUN 48* 31*  CREATININE 1.85* 1.66*  CALCIUM 8.7* 8.5*    Recent Labs  02/20/16 1416  AST 19  ALT 11*  ALKPHOS 62  BILITOT 0.7  PROT 5.9*  ALBUMIN 3.0*    Recent Labs  02/20/16 2335 02/21/16 0617  WBC 11.8* 8.7  HGB 10.2* 9.9*  HCT 31.8* 31.0*  MCV 80.5 79.7  PLT 96* 93*    Recent Labs  02/20/16 1449  LABPROT 14.7  INR 1.14      Assessment/Plan: Dark stools ?? Melena. : Hemoglobin dropped to 10.8 on admission  from baseline 13. Hgb 9.9 today  Occult blood positive. In setting of dark stools Syncopal episode. Resolved. Bradycardia. Resolved. Hypertension Chronic kidney disease Dementia  Recommendations ------------------------ Continue twice a day PPI for now.  Discussed the need for endoscopy with daughter. Prefers observation for now. Want to have endoscopy only if ongoing drop in hgb.  Full liquid diet for now  NPO PM  GI will follow.    Anthony [name] 02/21/2016, 12:25 PM  Pager [phone]  If no answer or after 5 PM call [phone]

## 2016-02-22 LAB — BASIC METABOLIC PANEL
Anion gap: 6 (ref 5–15)
BUN: 20 mg/dL (ref 6–20)
CHLORIDE: 113 mmol/L — AB (ref 101–111)
CO2: 23 mmol/L (ref 22–32)
CREATININE: 1.7 mg/dL — AB (ref 0.61–1.24)
Calcium: 8.5 mg/dL — ABNORMAL LOW (ref 8.9–10.3)
GFR calc non Af Amer: 37 mL/min — ABNORMAL LOW (ref >60)
GFR, EST AFRICAN AMERICAN: 43 mL/min — AB (ref >60)
GLUCOSE: 81 mg/dL (ref 65–99)
Potassium: 3.8 mmol/L (ref 3.5–5.1)
Sodium: 142 mmol/L (ref 135–145)

## 2016-02-22 LAB — CBC
HEMATOCRIT: 28 % — AB (ref 39.0–52.0)
HEMOGLOBIN: 8.9 g/dL — AB (ref 13.0–17.0)
MCH: 25.6 pg — AB (ref 26.0–34.0)
MCHC: 31.8 g/dL (ref 30.0–36.0)
MCV: 80.5 fL (ref 78.0–100.0)
Platelets: 91 10*3/uL — ABNORMAL LOW (ref 150–400)
RBC: 3.48 MIL/uL — AB (ref 4.22–5.81)
RDW: 15 % (ref 11.5–15.5)
WBC: 6.3 10*3/uL (ref 4.0–10.5)

## 2016-02-22 NOTE — Progress Notes (Signed)
PROGRESS NOTE    Anthony [name]  [id number] DOB: 01-15-1936 DOA: 02/20/2016 PCP: Romero BellingELLISON, SEAN, MD   Brief Narrative:  Mr Anthony [name] is a pleasant 80 year old gentleman with a past medical history of cognitive impairment, on aspirin therapy, admitted to the medicine service on 02/20/2016 presented with dark stools. Family members reporting that he was on Aleve as needed however had been taking it daily in the past week for arthritic pain. Initial lab work revealed a hemoglobin of 10.8, had been 12.9 earlier this year. Case discussed with GI who recommended a conservative approach, unless he were to have another episode of GI bleed at which time he would undergo upper endoscopy.     Assessment & Plan:   Principal Problem:   Near syncope Active Problems:   HTN (hypertension)   Melena   GI bleed   GI bleed due to NSAIDs  1.  Suspected Upper GI Bleed.  -Mr Anthony [name] is a 80 year old presenting with black tarry stools, has not had any further episodes since hospitalization. -Lab work showing hemoglobin of 9.9 this morning, down from 10.8 on admission -I spoke with GI regarding EGD, Dr Levora AngelBrahmbhatt who recommended conservative management. -Plan to continue Protonix 40 mg by mouth twice a day. Stop ASA. Family educated on not taking NSAIDS.  -Repeat labs on 02/22/2016 showing Hg of 8.9 -Plan to continue monitoring H&H, GI recommending proceeding with upper endoscopy if he has signs of re-bleeding.   2.  Hypertension -His blood pressures were elevated, family reporting that he had not been on antihypertensives. Records showing allergy to ARB's and Amlodipine.   -Started Imdur 30 mg PO q daily on 02/21/2016 -On 02/22/2016 blood pressures improved.   3.  Dementia -He remains stable, functioning at his baseline -Will continue Aricept and Namenda  DVT prophylaxis: SCD's Code Status: Full Code Family Communication: I spoke with his daughter Disposition Plan: Possible discharge home in the next 24  hours  Consultants:   GI  Subjective: He is pleasantly confused, has no complaints  Objective: Vitals:   02/21/16 1201 02/21/16 2222 02/21/16 2224 02/22/16 0608  BP: (!) 187/100  138/66 134/84  Pulse: 92 78 63 70  Resp: 20  16 18   Temp:    98.4 F (36.9 C)  TempSrc:    Oral  SpO2: 100% 100% 100% 100%  Weight:      Height:        Intake/Output Summary (Last 24 hours) at 02/22/16 1415 Last data filed at 02/22/16 0500  Gross per 24 hour  Intake              480 ml  Output              300 ml  Net              180 ml   Filed Weights   02/20/16 1415  Weight: 85.7 kg (189 lb)    Examination:  General exam: Appears calm and comfortable, NAD  Respiratory system: Clear to auscultation. Respiratory effort normal. Cardiovascular system: S1 & S2 heard, RRR. No JVD, murmurs, rubs, gallops or clicks. No pedal edema. Gastrointestinal system: Abdomen is nondistended, soft and nontender. No organomegaly or masses felt. Normal bowel sounds heard. Central nervous system: Alert and oriented. No focal neurological deficits. Extremities: Symmetric 5 x 5 power. Skin: No rashes, lesions or ulcers Psychiatry: H/o of cognitive impairment, no acute distress    Data Reviewed: I have personally reviewed following labs and imaging studies  CBC:  Recent Labs Lab 02/20/16 1416 02/20/16 1846 02/20/16 2335 02/21/16 0617 02/21/16 1809 02/22/16 0618  WBC 8.1  --  11.8* 8.7  --  6.3  HGB 10.8* 10.6* 10.2* 9.9* 10.0* 8.9*  HCT 34.5* 34.2* 31.8* 31.0* 32.1* 28.0*  MCV 81.2  --  80.5 79.7  --  80.5  PLT PLATELET CLUMPS NOTED ON SMEAR, COUNT APPEARS ADEQUATE  --  96* 93*  --  91*   Basic Metabolic Panel:  Recent Labs Lab 02/20/16 1416 02/21/16 0617 02/22/16 0618  NA 144 144 142  K 4.2 3.5 3.8  CL 114* 114* 113*  CO2 26 25 23   GLUCOSE 85 95 81  BUN 48* 31* 20  CREATININE 1.85* 1.66* 1.70*  CALCIUM 8.7* 8.5* 8.5*   GFR: Estimated Creatinine Clearance: 37.4 mL/min (by C-G  formula based on SCr of 1.7 mg/dL). Liver Function Tests:  Recent Labs Lab 02/20/16 1416  AST 19  ALT 11*  ALKPHOS 62  BILITOT 0.7  PROT 5.9*  ALBUMIN 3.0*    Recent Labs Lab 02/20/16 1416  LIPASE 34   No results for input(s): AMMONIA in the last 168 hours. Coagulation Profile:  Recent Labs Lab 02/20/16 1449  INR 1.14   Cardiac Enzymes:  Recent Labs Lab 02/20/16 2335  TROPONINI 0.05*   BNP (last 3 results) No results for input(s): PROBNP in the last 8760 hours. HbA1C: No results for input(s): HGBA1C in the last 72 hours. CBG: No results for input(s): GLUCAP in the last 168 hours. Lipid Profile: No results for input(s): CHOL, HDL, LDLCALC, TRIG, CHOLHDL, LDLDIRECT in the last 72 hours. Thyroid Function Tests: No results for input(s): TSH, T4TOTAL, FREET4, T3FREE, THYROIDAB in the last 72 hours. Anemia Panel: No results for input(s): VITAMINB12, FOLATE, FERRITIN, TIBC, IRON, RETICCTPCT in the last 72 hours. Sepsis Labs: No results for input(s): PROCALCITON, LATICACIDVEN in the last 168 hours.  No results found for this or any previous visit (from the past 240 hour(s)).       Radiology Studies: No results found.      Scheduled Meds: . allopurinol  300 mg Oral Daily  . colestipol  5 g Oral BID  . donepezil  10 mg Oral QHS  . famotidine (PEPCID) IV  20 mg Intravenous Q12H  . isosorbide mononitrate  30 mg Oral Daily  . memantine  10 mg Oral BID  . pantoprazole (PROTONIX) IV  40 mg Intravenous Q12H  . polyethylene glycol  17 g Oral BID  . senna  1 tablet Oral BID  . sodium chloride flush  3 mL Intravenous Q12H  . sodium chloride flush  3 mL Intravenous Q12H   Continuous Infusions:     LOS: 2 days    Time spent: 25 min    Jeralyn Bennett, MD [facility] Pager [phone]  If 7PM-7AM, please contact night-coverage [email or web address] Password [email or web address] 02/22/2016, 2:15 PM

## 2016-02-22 NOTE — Progress Notes (Addendum)
Patient pulled out IV. 2 attempts made.  IV team consult placed

## 2016-02-22 NOTE — Progress Notes (Signed)
[facility] Progress Note  Subjective: Patient without complaint. No stools documented in last 24 hours, slight drop in hemoglobin  Objective: Vital signs in last 24 hours: Temp:  [98.4 F (36.9 C)-98.5 F (36.9 C)] 98.4 F (36.9 C) (09/05 40980608) Pulse Rate:  [63-92] 70 (09/05 0608) Resp:  [16-20] 18 (09/05 11910608) BP: (134-187)/(66-100) 134/84 (09/05 0608) SpO2:  [100 %] 100 % (09/05 47820608) Weight change:    PE: Unchanged  Lab Results: Results for orders placed or performed during the hospital encounter of 02/20/16 (from the past 24 hour(s))  Hemoglobin and hematocrit, blood     Status: Abnormal   Collection Time: 02/21/16  6:09 PM  Result Value Ref Range   Hemoglobin 10.0 (L) 13.0 - 17.0 g/dL   HCT 95.632.1 (L) 21.339.0 - 08.652.0 %  CBC     Status: Abnormal   Collection Time: 02/22/16  6:18 AM  Result Value Ref Range   WBC 6.3 4.0 - 10.5 K/uL   RBC 3.48 (L) 4.22 - 5.81 MIL/uL   Hemoglobin 8.9 (L) 13.0 - 17.0 g/dL   HCT 57.828.0 (L) 46.939.0 - 62.952.0 %   MCV 80.5 78.0 - 100.0 fL   MCH 25.6 (L) 26.0 - 34.0 pg   MCHC 31.8 30.0 - 36.0 g/dL   RDW 52.815.0 41.311.5 - 24.415.5 %   Platelets 91 (L) 150 - 400 K/uL  Basic metabolic panel     Status: Abnormal   Collection Time: 02/22/16  6:18 AM  Result Value Ref Range   Sodium 142 135 - 145 mmol/L   Potassium 3.8 3.5 - 5.1 mmol/L   Chloride 113 (H) 101 - 111 mmol/L   CO2 23 22 - 32 mmol/L   Glucose, Bld 81 65 - 99 mg/dL   BUN 20 6 - 20 mg/dL   Creatinine, Ser 0.101.70 (H) 0.61 - 1.24 mg/dL   Calcium 8.5 (L) 8.9 - 10.3 mg/dL   GFR calc non Af Amer 37 (L) >60 mL/min   GFR calc Af Amer 43 (L) >60 mL/min   Anion gap 6 5 - 15    Studies/Results: Dg Chest Portable 1 View  Result Date: 02/20/2016 CLINICAL DATA:  Shortness of breath for approximately 1 year. Status post fall today. EXAM: PORTABLE CHEST 1 VIEW COMPARISON:  PA and lateral chest 09/10/2015 and 10/16/2013. FINDINGS: Lung volumes are low but the lungs are clear. Heart size is normal. No  pneumothorax or pleural effusion. No focal bony abnormality. IMPRESSION: No acute disease. Electronically Signed   By: Drusilla Kannerhomas  Dalessio M.D.   On: 02/20/2016 14:26      Assessment: GI bleed presenting as melena, hemodynamically stable with modest drop in hemoglobin with multiple medical problems and Alzheimer's dementia.  Plan: Will continue to monitor stools and hemoglobin and treat empirically with PPI. Will hold nothing by mouth after midnight in case of need for endoscopy tomorrow.    Latise Dilley C 02/22/2016, 8:45 AM  Pager [phone] If no answer or after 5 PM call [phone]

## 2016-02-23 DIAGNOSIS — K921 Melena: Principal | ICD-10-CM

## 2016-02-23 DIAGNOSIS — R55 Syncope and collapse: Secondary | ICD-10-CM

## 2016-02-23 DIAGNOSIS — T39395A Adverse effect of other nonsteroidal anti-inflammatory drugs [NSAID], initial encounter: Secondary | ICD-10-CM

## 2016-02-23 DIAGNOSIS — G3183 Dementia with Lewy bodies: Secondary | ICD-10-CM

## 2016-02-23 DIAGNOSIS — K922 Gastrointestinal hemorrhage, unspecified: Secondary | ICD-10-CM

## 2016-02-23 DIAGNOSIS — F0280 Dementia in other diseases classified elsewhere without behavioral disturbance: Secondary | ICD-10-CM

## 2016-02-23 DIAGNOSIS — I10 Essential (primary) hypertension: Secondary | ICD-10-CM

## 2016-02-23 LAB — CBC WITH DIFFERENTIAL/PLATELET
Basophils Absolute: 0 10*3/uL (ref 0.0–0.1)
Basophils Relative: 0 %
EOS ABS: 0.2 10*3/uL (ref 0.0–0.7)
EOS PCT: 3 %
HCT: 30 % — ABNORMAL LOW (ref 39.0–52.0)
Hemoglobin: 9.5 g/dL — ABNORMAL LOW (ref 13.0–17.0)
LYMPHS ABS: 1.6 10*3/uL (ref 0.7–4.0)
LYMPHS PCT: 23 %
MCH: 25.5 pg — AB (ref 26.0–34.0)
MCHC: 31.7 g/dL (ref 30.0–36.0)
MCV: 80.6 fL (ref 78.0–100.0)
MONO ABS: 0.4 10*3/uL (ref 0.1–1.0)
MONOS PCT: 6 %
Neutro Abs: 4.8 10*3/uL (ref 1.7–7.7)
Neutrophils Relative %: 68 %
PLATELETS: 97 10*3/uL — AB (ref 150–400)
RBC: 3.72 MIL/uL — AB (ref 4.22–5.81)
RDW: 15.1 % (ref 11.5–15.5)
WBC: 7.1 10*3/uL (ref 4.0–10.5)

## 2016-02-23 MED ORDER — PANTOPRAZOLE SODIUM 40 MG PO TBEC
40.0000 mg | DELAYED_RELEASE_TABLET | Freq: Two times a day (BID) | ORAL | Status: DC
  Administered 2016-02-23 – 2016-02-24 (×2): 40 mg via ORAL
  Filled 2016-02-23 (×2): qty 1

## 2016-02-23 MED ORDER — FAMOTIDINE 20 MG PO TABS
40.0000 mg | ORAL_TABLET | Freq: Every day | ORAL | Status: DC
  Administered 2016-02-23: 40 mg via ORAL
  Filled 2016-02-23: qty 2

## 2016-02-23 NOTE — Care Management Important Message (Signed)
Important Message  Patient Details  Name: Anthony [name] MRN: [id number] Date of Birth: [birth year]   Medicare Important Message Given:  Yes    [name] 02/23/2016, 9:12 AM

## 2016-02-23 NOTE — Progress Notes (Signed)
Patient up to bathroom and had small soft BM, noted to be brown in color. No signs of bleeding noted at this time. Patient assisted back to bed without event. Daughter at bedside.

## 2016-02-23 NOTE — Progress Notes (Signed)
[facility] Progress Note  Subjective: Feels fine, wants to eat, nurse chart today relatively normal-appearing stool yesterday evening. Hemoglobin today pending  Objective: Vital signs in last 24 hours: Temp:  [98.1 F (36.7 C)-98.4 F (36.9 C)] 98.3 F (36.8 C) (09/06 0558) Pulse Rate:  [63-79] 79 (09/06 0558) Resp:  [18] 18 (09/06 0558) BP: (140-170)/(80-92) 160/80 (09/06 0558) SpO2:  [99 %-100 %] 99 % (09/06 0558) Weight change:    PE: Unchanged  Lab Results: No results found for this or any previous visit (from the past 24 hour(s)).  Studies/Results: No results found.    Assessment: Melenic stools, hemodynamically stable since admission   Plan: Continue PPI Hold off on endoscopy today. Await hemoglobin Continue to monitor stools Allow diet Possible discharge tomorrow if stable    Amire Leazer C 02/23/2016, 8:08 AM  Pager [phone] If no answer or after 5 PM call [phone]

## 2016-02-23 NOTE — Evaluation (Signed)
Physical Therapy Evaluation Patient Details Name: Anthony [name] MRN: [id number] DOB: Jul 17, 1935 Today's Date: 02/23/2016   History of Present Illness  Mr Kimmel is a pleasant 80 year old gentleman with a past medical history of cognitive impairment, HTN, gout, DM, on aspirin therapy, admitted to the medicine service on 02/20/2016 presented with dark stools.  Clinical Impression  Pt admitted with above diagnosis. Pt currently with functional limitations due to the deficits listed below (see PT Problem List). Pt mobilizing with min-guard A, decreased safety due to impaired cognition. No family present to determine baseline. Will follow pt acutely to counter decreased strength in hospital but do not anticipate him needing services at d/c.  Pt will benefit from skilled PT to increase their independence and safety with mobility to allow discharge to the venue listed below.       Follow Up Recommendations No PT follow up    Equipment Recommendations  None recommended by PT    Recommendations for Other Services       Precautions / Restrictions Precautions Precautions: Fall Restrictions Weight Bearing Restrictions: No      Mobility  Bed Mobility Overal bed mobility: Modified Independent                Transfers Overall transfer level: Needs assistance Equipment used: None Transfers: Sit to/from Stand Sit to Stand: Min guard         General transfer comment: min-guard for safety but pt without LOB  Ambulation/Gait Ambulation/Gait assistance: Min guard Ambulation Distance (Feet): 400 Feet Assistive device: None Gait Pattern/deviations: WFL(Within Functional Limits) Gait velocity: WFL Gait velocity interpretation: at or above normal speed for age/gender General Gait Details: pt ambulates with good speed and no LOB, but confused, does not know where he is or how to get back to room  Stairs Stairs: Yes Stairs assistance: Min guard Stair Management: One rail  Right;Alternating pattern;Forwards Number of Stairs: 12 General stair comments: min-guard for safety but pt with sufficient strength to ascend stairs with alternating pattern  Wheelchair Mobility    Modified Rankin (Stroke Patients Only)       Balance Overall balance assessment: Needs assistance Sitting-balance support: No upper extremity supported Sitting balance-Leahy Scale: Good     Standing balance support: No upper extremity supported Standing balance-Leahy Scale: Good Standing balance comment: pt can maintain balance with static and dynamic activity but with overall decreased awareness of self and body in space leading to increased fall risk                             Pertinent Vitals/Pain Pain Assessment: No/denies pain    Home Living Family/patient expects to be discharged to:: Private residence Living Arrangements: Children               Additional Comments: pt unable to give any accurate history about home environment. He reports that he lives at home with his 2 sets of parents. Per chart, he is from home with children.    Prior Function Level of Independence:  (unsure)         Comments: unsure of PLOF     Hand Dominance   Dominant Hand: Right    Extremity/Trunk Assessment   Upper Extremity Assessment: Overall WFL for tasks assessed           Lower Extremity Assessment: Overall WFL for tasks assessed      Cervical / Trunk Assessment: Normal  Communication   Communication: No difficulties  Cognition Arousal/Alertness: Awake/alert Behavior During Therapy: WFL for tasks assessed/performed Overall Cognitive Status: No family/caregiver present to determine baseline cognitive functioning       Memory: Decreased short-term memory              General Comments      Exercises        Assessment/Plan    PT Assessment Patient needs continued PT services  PT Diagnosis Altered mental status   PT Problem List  Decreased cognition;Decreased mobility;Decreased balance  PT Treatment Interventions Gait training;Functional mobility training;Therapeutic exercise;Therapeutic activities;Balance training;Patient/family education   PT Goals (Current goals can be found in the Care Plan section) Acute Rehab PT Goals Patient Stated Goal: none stated PT Goal Formulation: With patient Time For Goal Achievement: 03/08/16 Potential to Achieve Goals: Good    Frequency Min 3X/week   Barriers to discharge   unsure    Co-evaluation               End of Session Equipment Utilized During Treatment: Gait belt Activity Tolerance: Patient tolerated treatment well Patient left: in chair;with chair alarm set;with call bell/phone within reach Nurse Communication: Mobility status         Time: [id number] PT Time Calculation (min) (ACUTE ONLY): 18 min   Charges:   PT Evaluation $PT Eval Moderate Complexity: 1 Procedure     PT G Codes:       Lyanne CoVictoria Ryer Asato, PT  Acute Rehab Services  [phone]  [place] 02/23/2016, 4:20 PM

## 2016-02-23 NOTE — Progress Notes (Signed)
PROGRESS NOTE    CANNEN DUPRAS  ZOX:[id number] DOB: Jul 13, 1935 DOA: 02/20/2016 PCP: Romero Belling, MD   Brief Narrative:  Anthony [name] is a pleasant 80 year old gentleman with a past medical history of cognitive impairment, on aspirin therapy, admitted to the medicine service on 02/20/2016 presented with dark stools. Family members reporting that he was on Aleve as needed however had been taking it daily in the past week for arthritic pain. Initial lab work revealed a hemoglobin of 10.8, had been 12.9 earlier this year. Case discussed with GI who recommended a conservative approach, unless he were to have another episode of GI bleed at which time he would undergo upper endoscopy.     Assessment & Plan:   Principal Problem:   Near syncope Active Problems:   HTN (hypertension)   Melena   GI bleed   GI bleed due to NSAIDs  1.  Suspected Upper GI Bleed.  -Anthony Mcnutt is a 80 year old presenting with black tarry stools, has not had any further episodes since hospitalization. -Family and patient educated on not taking NSAIDS. ASA also discontinued  -Hgb 9.5 -Per GI no need for endoscopy procedures currently; will advance diet and continue PPI.  2.  Hypertension -His blood pressures were elevated, family reporting that he had not been on antihypertensives. Records showing allergy to ARB's and Amlodipine.   -Started Imdur 30 mg PO q daily on 02/21/2016 -On 02/22/2016 blood pressures improved.   3.  Dementia -He remains stable, functioning at his baseline -Will continue Aricept and Namenda -PT/OT consulted  DVT prophylaxis: SCD's Code Status: Full Code Family Communication: no family at bedside  Disposition Plan: Possible discharge home in the next 24 hours or so.   Consultants:   GI  Subjective: He is pleasantly confused, has no acute complaints. Denies CP, SOB, nausea and vomiting.   Objective: Vitals:   02/22/16 1430 02/22/16 2110 02/23/16 0558 02/23/16 1402  BP: (!) 170/92  140/82 (!) 160/80 (!) 183/97  Pulse: 63 72 79 73  Resp: 18 18 18 20   Temp: 98.4 F (36.9 C) 98.1 F (36.7 C) 98.3 F (36.8 C) 98.6 F (37 C)  TempSrc: Oral Oral Oral Oral  SpO2: 100% 100% 99% 100%  Weight:      Height:        Intake/Output Summary (Last 24 hours) at 02/23/16 1824 Last data filed at 02/23/16 1300  Gross per 24 hour  Intake              570 ml  Output                6 ml  Net              564 ml   Filed Weights   02/20/16 1415  Weight: 85.7 kg (189 lb)    Examination:  General exam: Appears calm and comfortable, NAD. Reports no nausea, no vomiting and last BM yesterday evening w/o signs of bleeding. Respiratory system: Clear to auscultation. Respiratory effort normal. Cardiovascular system: S1 & S2 heard, RRR. No JVD, murmurs, rubs, gallops or clicks. No pedal edema. Gastrointestinal system: Abdomen is nondistended, soft and nontender. No organomegaly or masses felt. Normal bowel sounds heard. Central nervous system: Alert and oriented. No focal neurological deficits. Extremities: Symmetric 5 x 5 power. Skin: No rashes, lesions or ulcers Psychiatry: H/o of cognitive impairment, no acute distress   Data Reviewed: I have personally reviewed following labs and imaging studies  CBC:  Recent Labs Lab  02/20/16 1416  02/20/16 2335 02/21/16 0617 02/21/16 1809 02/22/16 0618 02/23/16 0920  WBC 8.1  --  11.8* 8.7  --  6.3 7.1  NEUTROABS  --   --   --   --   --   --  4.8  HGB 10.8*  < > 10.2* 9.9* 10.0* 8.9* 9.5*  HCT 34.5*  < > 31.8* 31.0* 32.1* 28.0* 30.0*  MCV 81.2  --  80.5 79.7  --  80.5 80.6  PLT PLATELET CLUMPS NOTED ON SMEAR, COUNT APPEARS ADEQUATE  --  96* 93*  --  91* 97*  < > = values in this interval not displayed. Basic Metabolic Panel:  Recent Labs Lab 02/20/16 1416 02/21/16 0617 02/22/16 0618  NA 144 144 142  K 4.2 3.5 3.8  CL 114* 114* 113*  CO2 26 25 23   GLUCOSE 85 95 81  BUN 48* 31* 20  CREATININE 1.85* 1.66* 1.70*  CALCIUM  8.7* 8.5* 8.5*   GFR: Estimated Creatinine Clearance: 37.4 mL/min (by C-G formula based on SCr of 1.7 mg/dL).   Liver Function Tests:  Recent Labs Lab 02/20/16 1416  AST 19  ALT 11*  ALKPHOS 62  BILITOT 0.7  PROT 5.9*  ALBUMIN 3.0*    Recent Labs Lab 02/20/16 1416  LIPASE 34   Coagulation Profile:  Recent Labs Lab 02/20/16 1449  INR 1.14   Cardiac Enzymes:  Recent Labs Lab 02/20/16 2335  TROPONINI 0.05*    Radiology Studies: No results found.   Scheduled Meds: . allopurinol  300 mg Oral Daily  . colestipol  5 g Oral BID  . donepezil  10 mg Oral QHS  . famotidine (PEPCID) IV  20 mg Intravenous Q12H  . isosorbide mononitrate  30 mg Oral Daily  . memantine  10 mg Oral BID  . pantoprazole (PROTONIX) IV  40 mg Intravenous Q12H  . polyethylene glycol  17 g Oral BID  . senna  1 tablet Oral BID  . sodium chloride flush  3 mL Intravenous Q12H  . sodium chloride flush  3 mL Intravenous Q12H   Continuous Infusions:     LOS: 3 days    Time spent: 25 min    Vassie LollMadera, Akari Defelice, MD [facility] Pager [phone]  If 7PM-7AM, please contact night-coverage [email or web address] Password [email or web address] 02/23/2016, 6:24 PM

## 2016-02-24 DIAGNOSIS — N400 Enlarged prostate without lower urinary tract symptoms: Secondary | ICD-10-CM

## 2016-02-24 DIAGNOSIS — K219 Gastro-esophageal reflux disease without esophagitis: Secondary | ICD-10-CM

## 2016-02-24 DIAGNOSIS — F0280 Dementia in other diseases classified elsewhere without behavioral disturbance: Secondary | ICD-10-CM

## 2016-02-24 DIAGNOSIS — N179 Acute kidney failure, unspecified: Secondary | ICD-10-CM

## 2016-02-24 DIAGNOSIS — G3183 Dementia with Lewy bodies: Secondary | ICD-10-CM

## 2016-02-24 LAB — CBC
HEMATOCRIT: 29.4 % — AB (ref 39.0–52.0)
Hemoglobin: 9.4 g/dL — ABNORMAL LOW (ref 13.0–17.0)
MCH: 25.6 pg — ABNORMAL LOW (ref 26.0–34.0)
MCHC: 32 g/dL (ref 30.0–36.0)
MCV: 80.1 fL (ref 78.0–100.0)
PLATELETS: 104 10*3/uL — AB (ref 150–400)
RBC: 3.67 MIL/uL — ABNORMAL LOW (ref 4.22–5.81)
RDW: 15.2 % (ref 11.5–15.5)
WBC: 7.7 10*3/uL (ref 4.0–10.5)

## 2016-02-24 MED ORDER — LOSARTAN POTASSIUM-HCTZ 100-25 MG PO TABS: 1.0000 | ORAL_TABLET | Freq: Every day | ORAL | Status: DC

## 2016-02-24 MED ORDER — FAMOTIDINE 40 MG PO TABS: 40.0000 mg | ORAL_TABLET | Freq: Every day | ORAL | 1 refills | Status: DC

## 2016-02-24 MED ORDER — LOSARTAN POTASSIUM 50 MG PO TABS
100.0000 mg | ORAL_TABLET | Freq: Every day | ORAL | Status: DC
  Administered 2016-02-24: 100 mg via ORAL
  Filled 2016-02-24: qty 2

## 2016-02-24 MED ORDER — PANTOPRAZOLE SODIUM 40 MG PO TBEC: 40.0000 mg | DELAYED_RELEASE_TABLET | Freq: Two times a day (BID) | ORAL | 1 refills | Status: DC

## 2016-02-24 MED ORDER — POLYSACCHARIDE IRON COMPLEX 150 MG PO CAPS: 150.0000 mg | ORAL_CAPSULE | Freq: Two times a day (BID) | ORAL | 1 refills | Status: DC

## 2016-02-24 MED ORDER — ISOSORBIDE MONONITRATE ER 30 MG PO TB24: 30.0000 mg | ORAL_TABLET | Freq: Every day | ORAL | 1 refills | Status: DC

## 2016-02-24 MED ORDER — ACETAMINOPHEN 325 MG PO TABS: 650.0000 mg | ORAL_TABLET | Freq: Four times a day (QID) | ORAL | 0 refills | Status: AC | PRN

## 2016-02-24 MED ORDER — HYDROCHLOROTHIAZIDE 25 MG PO TABS
25.0000 mg | ORAL_TABLET | Freq: Every day | ORAL | Status: DC
  Administered 2016-02-24: 25 mg via ORAL
  Filled 2016-02-24: qty 1

## 2016-02-24 MED ORDER — DOXAZOSIN MESYLATE 4 MG PO TABS
4.0000 mg | ORAL_TABLET | Freq: Every day | ORAL | Status: DC
  Filled 2016-02-24: qty 1

## 2016-02-24 NOTE — Progress Notes (Signed)
Wallie CharEarnest E Vandervort to be D/C'd to home with home health per MD order.  Discussed with the patient's family and all questions fully answered.  VSS, Skin clean, dry and intact without evidence of skin break down, no evidence of skin tears noted. IV catheter discontinued intact. Site without signs and symptoms of complications. Dressing and pressure applied.  An After Visit Summary was printed and given to the patient's family. Patient's daughter received prescriptions.  D/c education completed with patient/family including follow up instructions, medication list, d/c activities limitations if indicated, with other d/c instructions as indicated by MD - patient able to verbalize understanding, all questions fully answered.   Patient instructed to return to ED, call 911, or call MD for any changes in condition.   Patient escorted via WC, and D/C home via private auto.  Joellyn HaffKayla L Price 02/24/2016 4:03 PM

## 2016-02-24 NOTE — Progress Notes (Signed)
[facility] Progress Note  Subjective: No new complaints today. No one else in room  Objective: Vital signs in last 24 hours: Temp:  [98.1 F (36.7 C)-98.6 F (37 C)] 98.1 F (36.7 C) (09/07 0629) Pulse Rate:  [66-82] 70 (09/07 0702) Resp:  [16-20] 16 (09/07 0629) BP: (152-183)/(90-107) 178/107 (09/07 0702) SpO2:  [100 %] 100 % (09/07 0702) Weight change:    PE: Unchanged  Lab Results: Results for orders placed or performed during the hospital encounter of 02/20/16 (from the past 24 hour(s))  CBC     Status: Abnormal   Collection Time: 02/24/16  3:09 AM  Result Value Ref Range   WBC 7.7 4.0 - 10.5 K/uL   RBC 3.67 (L) 4.22 - 5.81 MIL/uL   Hemoglobin 9.4 (L) 13.0 - 17.0 g/dL   HCT 16.129.4 (L) 09.639.0 - 04.552.0 %   MCV 80.1 78.0 - 100.0 fL   MCH 25.6 (L) 26.0 - 34.0 pg   MCHC 32.0 30.0 - 36.0 g/dL   RDW 40.915.2 81.111.5 - 91.415.5 %   Platelets 104 (L) 150 - 400 K/uL    Studies/Results: No results found.    Assessment: GI bleeding, questionable source, destabilizing and hemodynamically stable  Plan: I recommend empiric PPI therapy and continue to monitor stools and hemoglobin until discharge. We'll follow at a distance. Please call if further GI input needed.    Alsace Dowd C 02/24/2016, 11:29 AM  Pager [phone] If no answer or after 5 PM call [phone]

## 2016-02-24 NOTE — Discharge Summary (Signed)
Physician Discharge Summary  Anthony [name]:[id number] DOB: 1935-12-29 DOA: 02/20/2016  PCP: Romero Belling, MD  Admit date: 02/20/2016 Discharge date: 02/24/2016  Time spent: 35 minutes  Recommendations for Outpatient Follow-up:  1. Repeat CBC to follow Hgb trend  2. Repeat BMET to follow electrolytes and renal function 3. Reassess BP and adjust antihypertensive regimen as needed    Discharge Diagnoses:  Principal Problem:   Near syncope Active Problems:   HTN (hypertension)   Melena   Gastrointestinal hemorrhage with melena   GI bleed due to NSAIDs   AKI (acute kidney injury) (HCC)   Esophageal reflux   BPH (benign prostatic hyperplasia)   Lewy body dementia without behavioral disturbance   Discharge Condition: stable and improved. No further signs of melanotic/bloody stools. Will discharge home with instructions to follow up with PCP in 10 days.  Diet recommendation: heart healthy diet   Filed Weights   02/20/16 1415  Weight: 85.7 kg (189 lb)    History of present illness:  As per H&P written by Anthony [name] on 02/20/16 80 y.o. male, With episode of dizziness and near-syncope accompanied by emesis without coughing grounds subsequently patient had melanotic stools x1 . Baseline hemoglobin usually 13, today hemoglobin is down 10.8. Patient takes Aleve at least 5 out of 7 days a week for arthritis and gouty troponin type pain. Denies abdominal pain dizziness or shortness of breath chest pains palpitations at this time, patient is not a great historian so additional history obtained from patient's daughter [name] and patient's son at bedside. He did not fall and no frank syncope. Patient apparently was holding onto his chest but he never really complain of chest pain at any time.   Hospital Course:  1.  Suspected Upper GI Bleed.  -Mr Widen is a 80 year old presenting with black tarry stools, has not had any further episodes since hospitalization. -Hgb 9.5 -Per GI no need for  endoscopy procedures currently -discharge on PPI BID and famotidine QHS -patient advised to avoid the use of NSAID's   2.  Hypertension -His blood pressures were elevated, patient not taking medications on admission due to concern for bleeding -will discharge on home cozaar-HCTZ, also Imdur and Cardura -advise to follow low sodium diet and to follow up with PCP for further adjustment to his antihypertensive regimen as needed   3.  Dementia -He remains stable, functioning at his baseline -Will continue Aricept and Namenda -PT/OT consulted and recommended assistance and care -will arrange for [facility] and HH Aide  4.  GERD -will discharge on PPI BID and also Famotidine QHS.  5.  BPH -will continue cardura   Procedures:  See below for x-ray reports   Consultations:  GI   Discharge Exam: Vitals:   02/24/16 0702 02/24/16 1331  BP: (!) 178/107 (!) 189/90  Pulse: 70 65  Resp:  16  Temp:  98.3 F (36.8 C)   General exam: Appears calm and comfortable, NAD. Reports no nausea, no vomiting and no further episodes of bloody stools. Patient is tolerating diet w/o problems. Respiratory system: Clear to auscultation. Respiratory effort normal. Cardiovascular system: S1 & S2 heard, RRR. No JVD, murmurs, rubs, gallops or clicks. No pedal edema. Gastrointestinal system: Abdomen is nondistended, soft and nontender. No organomegaly or masses felt. Normal bowel sounds heard. Central nervous system: Alert and oriented. No focal neurological deficits. Extremities: Symmetric 5 x 5 power. Skin: No rashes, lesions or ulcers Psychiatry: H/o of cognitive impairment, no acute distress  Discharge Instructions  Discharge Instructions    Diet - low sodium heart healthy    Complete by:  As directed   Discharge instructions    Complete by:  As directed   Follow heart healthy diet  Take medications as prescribed Arrange follow up with PCP in 10 days  Avoid the use of NSAIDs     Current  Discharge Medication List    START taking these medications   Details  acetaminophen (TYLENOL) 325 MG tablet Take 2 tablets (650 mg total) by mouth every 6 (six) hours as needed for mild pain, moderate pain or headache (or Fever >/= 101). Qty: 40 tablet, Refills: 0    famotidine (PEPCID) 40 MG tablet Take 1 tablet (40 mg total) by mouth at bedtime. Qty: 30 tablet, Refills: 1    iron polysaccharides (NIFEREX) 150 MG capsule Take 1 capsule (150 mg total) by mouth 2 (two) times daily. Qty: 60 capsule, Refills: 1    isosorbide mononitrate (IMDUR) 30 MG 24 hr tablet Take 1 tablet (30 mg total) by mouth daily. Qty: 30 tablet, Refills: 1    pantoprazole (PROTONIX) 40 MG tablet Take 1 tablet (40 mg total) by mouth 2 (two) times daily. Qty: 60 tablet, Refills: 1      CONTINUE these medications which have NOT CHANGED   Details  allopurinol (ZYLOPRIM) 300 MG tablet TAKE ONE TABLET BY MOUTH ONCE DAILY Qty: 30 tablet, Refills: 0    aspirin 81 MG tablet Take 1 tablet (81 mg total) by mouth daily. Qty: 90 tablet, Refills: 2    colestipol (COLESTID) 5 g granules Take 5 g by mouth 2 (two) times daily. Qty: 500 g, Refills: 12    donepezil (ARICEPT) 10 MG tablet TAKE ONE TABLET BY MOUTH AT BEDTIME Qty: 30 tablet, Refills: 0    doxazosin (CARDURA) 4 MG tablet TAKE ONE TABLET BY MOUTH ONCE DAILY. PATIENT NEEDS APPOINTMENT FOR FUTHER REFILLS Qty: 30 tablet, Refills: 0    memantine (NAMENDA) 10 MG tablet TAKE ONE TABLET BY MOUTH TWICE DAILY Qty: 180 tablet, Refills: 0    Glucose Blood (BAYER BREEZE 2 TEST) DISK Use twice daily to check Blood sugars Qty: 200 each, Refills: 0    losartan-hydrochlorothiazide (HYZAAR) 100-25 MG per tablet TAKE ONE TABLET BY MOUTH ONCE DAILY Qty: 90 tablet, Refills: 0      STOP taking these medications     furosemide (LASIX) 20 MG tablet      naproxen sodium (ANAPROX) 220 MG tablet        Allergies  Allergen Reactions  . Amlodipine-Atorvastatin Other  (See Comments)    REACTION: LEG CRAMPS  . Benazepril Hcl Other (See Comments)    REACTION: Cough  . Ezetimibe Other (See Comments)    REACTION: edema   Follow-up Information    Romero Belling, MD. Schedule an appointment as soon as possible for a visit in 10 day(s).   Specialty:  Endocrinology Contact information: [address] [place] [address] [phone]           The results of significant diagnostics from this hospitalization (including imaging, microbiology, ancillary and laboratory) are listed below for reference.    Significant Diagnostic Studies: Dg Chest Portable 1 View  Result Date: 02/20/2016 CLINICAL DATA:  Shortness of breath for approximately 1 year. Status post fall today. EXAM: PORTABLE CHEST 1 VIEW COMPARISON:  PA and lateral chest 09/10/2015 and 10/16/2013. FINDINGS: Lung volumes are low but the lungs are clear. Heart size is normal. No pneumothorax or  pleural effusion. No focal bony abnormality. IMPRESSION: No acute disease. Electronically Signed   By: Drusilla Kannerhomas  Dalessio M.D.   On: 02/20/2016 14:26   Labs: Basic Metabolic Panel:  Recent Labs Lab 02/20/16 1416 02/21/16 0617 02/22/16 0618  NA 144 144 142  K 4.2 3.5 3.8  CL 114* 114* 113*  CO2 26 25 23   GLUCOSE 85 95 81  BUN 48* 31* 20  CREATININE 1.85* 1.66* 1.70*  CALCIUM 8.7* 8.5* 8.5*   Liver Function Tests:  Recent Labs Lab 02/20/16 1416  AST 19  ALT 11*  ALKPHOS 62  BILITOT 0.7  PROT 5.9*  ALBUMIN 3.0*    Recent Labs Lab 02/20/16 1416  LIPASE 34   CBC:  Recent Labs Lab 02/20/16 2335 02/21/16 0617 02/21/16 1809 02/22/16 0618 02/23/16 0920 02/24/16 0309  WBC 11.8* 8.7  --  6.3 7.1 7.7  NEUTROABS  --   --   --   --  4.8  --   HGB 10.2* 9.9* 10.0* 8.9* 9.5* 9.4*  HCT 31.8* 31.0* 32.1* 28.0* 30.0* 29.4*  MCV 80.5 79.7  --  80.5 80.6 80.1  PLT 96* 93*  --  91* 97* 104*   Cardiac Enzymes:  Recent Labs Lab 02/20/16 2335  TROPONINI 0.05*     Signed:  Vassie LollMadera, [place]set Joshi MD.  [facility] 02/24/2016, 1:53 PM

## 2016-02-24 NOTE — Care Management Note (Signed)
Case Management Note  Patient Details  Name: Lynnette Caffeyarnest E Delpozo MRN: [id number] Date of Birth: 08/11/1935  Subjective/Objective:     Admitted with GI Bleed,  history of cognitive impairment. Resides with daughter. Pt needs assistance with bathing and dressing. Uses no assistive devices.          PCP: Romero BellingSean Ellison  Action/Plan: Plan is to d/c to home today.  Expected Discharge Date:   02/24/2016          Expected Discharge Plan:  Home/Self Care  In-House Referral:     Discharge planning Services  CM Consult  Post Acute Care Choice:    Choice offered to:   daughter, Marylene Landngela  DME Arranged:    DME Agency:     HH Arranged:   RN, aide HH Agency:   [facility]  Status of Service:  Completed, signed off  If discussed at Long Length of Stay Meetings, dates discussed:    Additional Comments:  Epifanio LeschesCole, Eydan Chianese Hudson, RN 02/24/2016, 10:06 AM

## 2016-02-26 ENCOUNTER — Other Ambulatory Visit: Payer: Self-pay | Admitting: Endocrinology

## 2016-02-28 ENCOUNTER — Telehealth: Payer: Self-pay | Admitting: Endocrinology

## 2016-02-28 DIAGNOSIS — R413 Other amnesia: Secondary | ICD-10-CM

## 2016-02-28 NOTE — Addendum Note (Signed)
Addended by: Romero BellingELLISON, Vastie Douty on: 02/28/2016 06:17 PM   Modules accepted: Orders

## 2016-02-28 NOTE — Telephone Encounter (Signed)
ok 

## 2016-02-28 NOTE — Telephone Encounter (Signed)
[facility] currently does not have a occupational therapist or phthisical therapist. Could a referral be placed in epic? Thanks!

## 2016-02-28 NOTE — Telephone Encounter (Signed)
See message and please advise, Thanks!  

## 2016-02-28 NOTE — Telephone Encounter (Signed)
[phone]  Daughters are requesting assistance can instruction on how to bathe him, he is progressing with demential  OT could help him could we do a referral for both OT and PT he is high risk for falls PT eval for fall risk

## 2016-02-28 NOTE — Telephone Encounter (Signed)
Requires face to face visit.  We'll address then.

## 2016-02-29 NOTE — Telephone Encounter (Signed)
I contacted the patient's daughter and advised of message via voicemail. Requested a call back to schedule office visit.

## 2016-03-09 ENCOUNTER — Telehealth: Payer: Self-pay | Admitting: Endocrinology

## 2016-03-09 ENCOUNTER — Encounter: Payer: Self-pay | Admitting: Endocrinology

## 2016-03-09 ENCOUNTER — Ambulatory Visit (INDEPENDENT_AMBULATORY_CARE_PROVIDER_SITE_OTHER): Payer: Medicare Other | Admitting: Endocrinology

## 2016-03-09 VITALS — BP 116/64 | HR 67 | Ht 69.0 in | Wt 179.0 lb

## 2016-03-09 DIAGNOSIS — M109 Gout, unspecified: Secondary | ICD-10-CM | POA: Diagnosis not present

## 2016-03-09 DIAGNOSIS — N183 Chronic kidney disease, stage 3 unspecified: Secondary | ICD-10-CM

## 2016-03-09 DIAGNOSIS — N179 Acute kidney failure, unspecified: Secondary | ICD-10-CM | POA: Insufficient documentation

## 2016-03-09 DIAGNOSIS — D649 Anemia, unspecified: Secondary | ICD-10-CM

## 2016-03-09 DIAGNOSIS — R011 Cardiac murmur, unspecified: Secondary | ICD-10-CM

## 2016-03-09 LAB — CBC WITH DIFFERENTIAL/PLATELET
BASOS PCT: 0.4 % (ref 0.0–3.0)
Basophils Absolute: 0 10*3/uL (ref 0.0–0.1)
EOS ABS: 0.2 10*3/uL (ref 0.0–0.7)
EOS PCT: 2.6 % (ref 0.0–5.0)
HEMATOCRIT: 32.1 % — AB (ref 39.0–52.0)
HEMOGLOBIN: 10.2 g/dL — AB (ref 13.0–17.0)
LYMPHS PCT: 20.8 % (ref 12.0–46.0)
Lymphs Abs: 1.6 10*3/uL (ref 0.7–4.0)
MCHC: 31.9 g/dL (ref 30.0–36.0)
MCV: 82.1 fl (ref 78.0–100.0)
MONO ABS: 0.5 10*3/uL (ref 0.1–1.0)
Monocytes Relative: 5.9 % (ref 3.0–12.0)
Neutro Abs: 5.6 10*3/uL (ref 1.4–7.7)
Neutrophils Relative %: 70.3 % (ref 43.0–77.0)
PLATELETS: 178 10*3/uL (ref 150.0–400.0)
RBC: 3.91 Mil/uL — AB (ref 4.22–5.81)
RDW: 16 % — ABNORMAL HIGH (ref 11.5–15.5)
WBC: 7.9 10*3/uL (ref 4.0–10.5)

## 2016-03-09 LAB — BASIC METABOLIC PANEL
BUN: 23 mg/dL (ref 6–23)
CHLORIDE: 105 meq/L (ref 96–112)
CO2: 31 mEq/L (ref 19–32)
Calcium: 8.8 mg/dL (ref 8.4–10.5)
Creatinine, Ser: 2.21 mg/dL — ABNORMAL HIGH (ref 0.40–1.50)
GFR: 37.21 mL/min — AB (ref >60.00)
GLUCOSE: 102 mg/dL — AB (ref 70–99)
Potassium: 3.9 mEq/L (ref 3.5–5.1)
SODIUM: 140 meq/L (ref 135–145)

## 2016-03-09 LAB — IBC PANEL
IRON: 44 ug/dL (ref 42–165)
SATURATION RATIOS: 18.1 % — AB (ref 20.0–50.0)
Transferrin: 174 mg/dL — ABNORMAL LOW (ref 212.0–360.0)

## 2016-03-09 LAB — URIC ACID: URIC ACID, SERUM: 4.2 mg/dL (ref 4.0–7.8)

## 2016-03-09 LAB — VITAMIN D 25 HYDROXY (VIT D DEFICIENCY, FRACTURES): VITD: 12.18 ng/mL — AB (ref 30.00–100.00)

## 2016-03-09 MED ORDER — VITAMIN D (ERGOCALCIFEROL) 1.25 MG (50000 UNIT) PO CAPS: 50000.0000 [IU] | ORAL_CAPSULE | ORAL | 0 refills | Status: DC

## 2016-03-09 MED ORDER — LOSARTAN POTASSIUM-HCTZ 100-25 MG PO TABS: ORAL_TABLET | ORAL | 11 refills | Status: DC

## 2016-03-09 NOTE — Patient Instructions (Addendum)
Please reduce the losartan-HCTZ to 1/2 pill per day.   blood tests are requested for you today.  We'll let you know about the results.   Let's check an "echo" test.  This result would help us know how to balance too much fluid vs not enough. you will receive a phone call, about a day and time for an appointment Please come back for a follow-up appointment in 1 month.

## 2016-03-09 NOTE — Telephone Encounter (Signed)
To clarify: I see you are already on iron, 1 pill, twice a day Please continue the same. I'll see you next time.

## 2016-03-09 NOTE — Progress Notes (Addendum)
Subjective:    Patient ID: Anthony [name], male    DOB: April 27, 1936, 80 y.o.   MRN: [id number]  HPI The state of at least three ongoing medical problems is addressed today, with interval history of each noted here: Pt was recently in the hospital for near-syncopal episode.  No dizziness now.   Anmeia: he denies BRBPR.  He takes fe 1/day.   Renal failure: he denies urinary incontinence.   Past Medical History:  Diagnosis Date  . ABNORMAL ELECTROCARDIOGRAM 05/03/2010  . Alzheimer disease   . DIABETES MELLITUS, TYPE II 03/15/2007  . GALLSTONES 03/15/2007  . GOUT 03/15/2007  . HYPERLIPIDEMIA 03/15/2007  . HYPERTENSION 03/15/2007  . NEPHROPATHY, DIABETIC 03/15/2007  . RENAL INSUFFICIENCY 04/30/2009  . SHOULDER PAIN, BILATERAL 04/29/2008  . UROLITHIASIS, HX OF 03/15/2007    Past Surgical History:  Procedure Laterality Date  . NASAL SEPTUM SURGERY  1978    Social History   Social History  . Marital status: Divorced    Spouse name: N/A  . Number of children: N/A  . Years of education: N/A   Occupational History  . Retired    Social History Main Topics  . Smoking status: Never Smoker  . Smokeless tobacco: Never Used  . Alcohol use No  . Drug use: No  . Sexual activity: No   Other Topics Concern  . Not on file   Social History Narrative   NOK is Daughter Sharol Given          Current Outpatient Prescriptions on File Prior to Visit  Medication Sig Dispense Refill  . acetaminophen (TYLENOL) 325 MG tablet Take 2 tablets (650 mg total) by mouth every 6 (six) hours as needed for mild pain, moderate pain or headache (or Fever >/= 101). 40 tablet 0  . allopurinol (ZYLOPRIM) 300 MG tablet TAKE ONE TABLET BY MOUTH ONCE DAILY (Patient taking differently: Take 300 mg by mouth once daily) 30 tablet 0  . aspirin 81 MG tablet Take 1 tablet (81 mg total) by mouth daily. 90 tablet 2  . colestipol (COLESTID) 5 g granules Take 5 g by mouth 2 (two) times daily. 500 g 12  . donepezil  (ARICEPT) 10 MG tablet TAKE ONE TABLET BY MOUTH AT BEDTIME (Patient taking differently: Take 10 mg by mouth in the morning) 30 tablet 0  . famotidine (PEPCID) 40 MG tablet Take 1 tablet (40 mg total) by mouth at bedtime. 30 tablet 1  . Glucose Blood (BAYER BREEZE 2 TEST) DISK Use twice daily to check Blood sugars 200 each 0  . iron polysaccharides (NIFEREX) 150 MG capsule Take 1 capsule (150 mg total) by mouth 2 (two) times daily. 60 capsule 1  . isosorbide mononitrate (IMDUR) 30 MG 24 hr tablet Take 1 tablet (30 mg total) by mouth daily. 30 tablet 1  . memantine (NAMENDA) 10 MG tablet TAKE ONE TABLET BY MOUTH TWICE DAILY (Patient taking differently: Take 10 mg by mouth twice daily) 180 tablet 0  . pantoprazole (PROTONIX) 40 MG tablet Take 1 tablet (40 mg total) by mouth 2 (two) times daily. 60 tablet 1   No current facility-administered medications on file prior to visit.     Allergies  Allergen Reactions  . Amlodipine-Atorvastatin Other (See Comments)    REACTION: LEG CRAMPS  . Benazepril Hcl Other (See Comments)    REACTION: Cough  . Ezetimibe Other (See Comments)    REACTION: edema    Family History  Problem Relation Age of Onset  .  Cancer Father     Prostate Cancer  . Colon cancer Neg Hx   . Esophageal cancer Neg Hx   . Stomach cancer Neg Hx     BP 116/64   Pulse 67   Ht 5\' 9"  (1.753 m)   Wt 179 lb (81.2 kg)   SpO2 93%   BMI 26.43 kg/m    Review of Systems Denies hematuria.  He has lost 3 lbs since last ov.  He denies sob.     Objective:   Physical Exam Vital signs: see vs page.  Gen: elderly, frail, no distress.  LUNGS:  Clear to auscultation, except for a few rales at the bases.   HEART:  Regular rate and rhythm; soft systolic murmur is noted. Normal S1,S2.   Ext: 1+ right leg edema (trace on the left).     Lab Results  Component Value Date   WBC 7.9 03/09/2016   HGB 10.2 (L) 03/09/2016   HCT 32.1 (L) 03/09/2016   MCV 82.1 03/09/2016   PLT 178.0  03/09/2016   Lab Results  Component Value Date   CREATININE 2.21 (H) 03/09/2016   BUN 23 03/09/2016   NA 140 03/09/2016   K 3.9 03/09/2016   CL 105 03/09/2016   CO2 31 03/09/2016   25-OH vit-D=12    Assessment & Plan:  Vit-D deficiency, moderate.  I have sent a prescription to your pharmacy. Renal failure: worse:  Anemia, persistent: increase iron to 1 pill, twice a day.  Near-syncope/murmur: check echo.  This patient would benefit from Home Health services. This patient would benefit from home health services, due to difficulty with ambulation, and high fall risk.

## 2016-03-10 LAB — PTH, INTACT AND CALCIUM
Calcium: 8.8 mg/dL (ref 8.6–10.3)
PTH: 76 pg/mL — AB (ref 14–64)

## 2016-03-10 NOTE — Telephone Encounter (Signed)
I contacted the patient's daughter and advised of message. She verbalized understanding and will advise the patient.

## 2016-03-21 ENCOUNTER — Other Ambulatory Visit: Payer: Self-pay | Admitting: Endocrinology

## 2016-03-26 NOTE — Addendum Note (Signed)
Addended by: Romero BellingELLISON, Kayvion Arneson on: 03/26/2016 01:21 PM   Modules accepted: Orders

## 2016-04-01 ENCOUNTER — Other Ambulatory Visit: Payer: Self-pay | Admitting: Endocrinology

## 2016-04-04 ENCOUNTER — Ambulatory Visit: Payer: Self-pay | Admitting: Neurology

## 2016-04-10 ENCOUNTER — Ambulatory Visit (INDEPENDENT_AMBULATORY_CARE_PROVIDER_SITE_OTHER): Payer: Medicare Other | Admitting: Neurology

## 2016-04-10 ENCOUNTER — Encounter: Payer: Self-pay | Admitting: Neurology

## 2016-04-10 ENCOUNTER — Ambulatory Visit (INDEPENDENT_AMBULATORY_CARE_PROVIDER_SITE_OTHER): Payer: Medicare Other | Admitting: Endocrinology

## 2016-04-10 ENCOUNTER — Encounter: Payer: Self-pay | Admitting: Endocrinology

## 2016-04-10 VITALS — BP 118/50 | HR 105 | Ht 69.0 in | Wt 176.0 lb

## 2016-04-10 VITALS — BP 140/68 | HR 102 | Ht 69.0 in | Wt 177.1 lb

## 2016-04-10 DIAGNOSIS — N183 Chronic kidney disease, stage 3 unspecified: Secondary | ICD-10-CM

## 2016-04-10 DIAGNOSIS — F03B0 Unspecified dementia, moderate, without behavioral disturbance, psychotic disturbance, mood disturbance, and anxiety: Secondary | ICD-10-CM

## 2016-04-10 DIAGNOSIS — M1A9XX0 Chronic gout, unspecified, without tophus (tophi): Secondary | ICD-10-CM | POA: Diagnosis not present

## 2016-04-10 DIAGNOSIS — Z23 Encounter for immunization: Secondary | ICD-10-CM | POA: Diagnosis not present

## 2016-04-10 DIAGNOSIS — E559 Vitamin D deficiency, unspecified: Secondary | ICD-10-CM

## 2016-04-10 DIAGNOSIS — F0390 Unspecified dementia without behavioral disturbance: Secondary | ICD-10-CM | POA: Diagnosis not present

## 2016-04-10 DIAGNOSIS — D649 Anemia, unspecified: Secondary | ICD-10-CM

## 2016-04-10 DIAGNOSIS — D72829 Elevated white blood cell count, unspecified: Secondary | ICD-10-CM | POA: Insufficient documentation

## 2016-04-10 DIAGNOSIS — F0280 Dementia in other diseases classified elsewhere without behavioral disturbance: Secondary | ICD-10-CM

## 2016-04-10 DIAGNOSIS — G3183 Dementia with Lewy bodies: Secondary | ICD-10-CM

## 2016-04-10 LAB — CBC WITH DIFFERENTIAL/PLATELET
BASOS PCT: 0 % (ref 0.0–3.0)
Basophils Absolute: 0 10*3/uL (ref 0.0–0.1)
EOS PCT: 0.3 % (ref 0.0–5.0)
Eosinophils Absolute: 0.1 10*3/uL (ref 0.0–0.7)
HEMATOCRIT: 35.1 % — AB (ref 39.0–52.0)
HEMOGLOBIN: 11.3 g/dL — AB (ref 13.0–17.0)
Lymphocytes Relative: 7.3 % — ABNORMAL LOW (ref 12.0–46.0)
Lymphs Abs: 1.5 10*3/uL (ref 0.7–4.0)
MCHC: 32 g/dL (ref 30.0–36.0)
MCV: 81.4 fl (ref 78.0–100.0)
MONO ABS: 0.6 10*3/uL (ref 0.1–1.0)
Monocytes Relative: 3 % (ref 3.0–12.0)
NEUTROS ABS: 18 10*3/uL — AB (ref 1.4–7.7)
Neutrophils Relative %: 89.4 % — ABNORMAL HIGH (ref 43.0–77.0)
PLATELETS: 183 10*3/uL (ref 150.0–400.0)
RBC: 4.31 Mil/uL (ref 4.22–5.81)
RDW: 15.6 % — AB (ref 11.5–15.5)

## 2016-04-10 LAB — BASIC METABOLIC PANEL
BUN: 32 mg/dL — ABNORMAL HIGH (ref 6–23)
CALCIUM: 8.9 mg/dL (ref 8.4–10.5)
CO2: 28 meq/L (ref 19–32)
CREATININE: 2.75 mg/dL — AB (ref 0.40–1.50)
Chloride: 103 mEq/L (ref 96–112)
GFR: 28.91 mL/min — AB (ref >60.00)
GLUCOSE: 98 mg/dL (ref 70–99)
Potassium: 3.8 mEq/L (ref 3.5–5.1)
Sodium: 139 mEq/L (ref 135–145)

## 2016-04-10 LAB — IBC PANEL
IRON: 8 ug/dL — AB (ref 42–165)
SATURATION RATIOS: 3.7 % — AB (ref 20.0–50.0)
TRANSFERRIN: 154 mg/dL — AB (ref 212.0–360.0)

## 2016-04-10 LAB — VITAMIN D 25 HYDROXY (VIT D DEFICIENCY, FRACTURES): VITD: 16.89 ng/mL — AB (ref 30.00–100.00)

## 2016-04-10 LAB — URIC ACID: URIC ACID, SERUM: 4.7 mg/dL (ref 4.0–7.8)

## 2016-04-10 MED ORDER — VITAMIN D (ERGOCALCIFEROL) 1.25 MG (50000 UNIT) PO CAPS: 50000.0000 [IU] | ORAL_CAPSULE | ORAL | 0 refills | Status: DC

## 2016-04-10 MED ORDER — LOSARTAN POTASSIUM-HCTZ 50-12.5 MG PO TABS: ORAL_TABLET | ORAL | 11 refills | Status: DC
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
## 2016-04-10 NOTE — Patient Instructions (Addendum)
Please reduce the losartan-HCTZ to 1/2 pill per day, of a smaller pill.  I have sent a prescription to your pharmacy.     Please continue the same medications for memory.   i'll let you know, when we get there results of the heart test.  Please come back for a follow-up appointment in 6 weeks.

## 2016-04-10 NOTE — Progress Notes (Signed)
Subjective:    Patient ID: Anthony [name], male    DOB: Nov 08, 1935, 80 y.o.   MRN: [id number]  HPI The state of at least three ongoing medical problems is addressed today, with interval history of each noted here: HTN: he takes meds as rx'ed.  Denies dizziness.  Gout: no recent sxs.   Dementia: dtr says his memory is worse.   Past Medical History:  Diagnosis Date  . ABNORMAL ELECTROCARDIOGRAM 05/03/2010  . Alzheimer disease   . DIABETES MELLITUS, TYPE II 03/15/2007  . GALLSTONES 03/15/2007  . GOUT 03/15/2007  . HYPERLIPIDEMIA 03/15/2007  . HYPERTENSION 03/15/2007  . NEPHROPATHY, DIABETIC 03/15/2007  . RENAL INSUFFICIENCY 04/30/2009  . SHOULDER PAIN, BILATERAL 04/29/2008  . UROLITHIASIS, HX OF 03/15/2007    Past Surgical History:  Procedure Laterality Date  . NASAL SEPTUM SURGERY  1978    Social History   Social History  . Marital status: Divorced    Spouse name: N/A  . Number of children: N/A  . Years of education: N/A   Occupational History  . Retired    Social History Main Topics  . Smoking status: Never Smoker  . Smokeless tobacco: Never Used  . Alcohol use No  . Drug use: No  . Sexual activity: No   Other Topics Concern  . Not on file   Social History Narrative   NOK is Daughter Anthony [name]          Current Outpatient Prescriptions on File Prior to Visit  Medication Sig Dispense Refill  . acetaminophen (TYLENOL) 325 MG tablet Take 2 tablets (650 mg total) by mouth every 6 (six) hours as needed for mild pain, moderate pain or headache (or Fever >/= 101). 40 tablet 0  . aspirin 81 MG tablet Take 1 tablet (81 mg total) by mouth daily. 90 tablet 2  . colestipol (COLESTID) 5 g granules Take 5 g by mouth 2 (two) times daily. 500 g 12  . donepezil (ARICEPT) 10 MG tablet TAKE ONE TABLET BY MOUTH ONCE DAILY AT BEDTIME 30 tablet 0  . famotidine (PEPCID) 40 MG tablet Take 1 tablet (40 mg total) by mouth at bedtime. 30 tablet 1  . iron polysaccharides  (NIFEREX) 150 MG capsule Take 1 capsule (150 mg total) by mouth 2 (two) times daily. 60 capsule 1  . isosorbide mononitrate (IMDUR) 30 MG 24 hr tablet Take 1 tablet (30 mg total) by mouth daily. 30 tablet 1  . memantine (NAMENDA) 10 MG tablet TAKE ONE TABLET BY MOUTH TWICE DAILY 180 tablet 0  . pantoprazole (PROTONIX) 40 MG tablet Take 1 tablet (40 mg total) by mouth 2 (two) times daily. 60 tablet 1  . allopurinol (ZYLOPRIM) 300 MG tablet TAKE ONE TABLET BY MOUTH ONCE DAILY 30 tablet 0  . Glucose Blood (BAYER BREEZE 2 TEST) DISK Use twice daily to check Blood sugars (Patient not taking: Reported on 04/10/2016) 200 each 0   No current facility-administered medications on file prior to visit.     Allergies  Allergen Reactions  . Amlodipine-Atorvastatin Other (See Comments)    REACTION: LEG CRAMPS  . Benazepril Hcl Other (See Comments)    REACTION: Cough  . Ezetimibe Other (See Comments)    REACTION: edema    Family History  Problem Relation Age of Onset  . Cancer Father     Prostate Cancer  . Colon cancer Neg Hx   . Esophageal cancer Neg Hx   . Stomach cancer Neg Hx  BP (!) 118/50   Pulse (!) 105   Ht 5\' 9"  (1.753 m)   Wt 176 lb (79.8 kg)   SpO2 92%   BMI 25.99 kg/m    Review of Systems Denies sob and hematuria.     Objective:   Physical Exam VITAL SIGNS:  See vs page GENERAL: no distress. Does not appear ill.   Gait: normal and steady.  PSYCH: Alert but not oriented.  Does not appear anxious nor depressed.     Lab Results  Component Value Date   CREATININE 2.75 (H) 04/10/2016   BUN 32 (H) 04/10/2016   NA 139 04/10/2016   K 3.8 04/10/2016   CL 103 04/10/2016   CO2 28 04/10/2016   Lab Results  Component Value Date   WBC 20.1 Repeated and verified X2. (HH) 04/10/2016   HGB 11.3 (L) 04/10/2016   HCT 35.1 (L) 04/10/2016   MCV 81.4 04/10/2016   PLT 183.0 04/10/2016   Lab Results  Component Value Date   LABURIC 4.7 04/10/2016       Assessment & Plan:    HTN: still slightly overcontrolled.   Leukocytosis, new, uncertain etiology. Should have w/u, despite pt not appearing ill.  I called dtr at 8 PM, and left mess on VM.  I advised to bring pt back for w/u tomorrow.  Also, if pt appears ill, bring his to ER.   Renal failure:  Stable Dementia: worse.  Please reduce the losartan-HCTZ to 1/2 pill per day, of a smaller pill.      Please continue the same medications for memory.

## 2016-04-10 NOTE — Patient Instructions (Signed)
1. Continue all your medications 2. Continue 24/7 supervision and home safety, agree with getting more help at home 3. Follow-up in 1 year

## 2016-04-11 ENCOUNTER — Ambulatory Visit (INDEPENDENT_AMBULATORY_CARE_PROVIDER_SITE_OTHER): Payer: Medicare Other | Admitting: Endocrinology

## 2016-04-11 ENCOUNTER — Ambulatory Visit (INDEPENDENT_AMBULATORY_CARE_PROVIDER_SITE_OTHER)
Admission: RE | Admit: 2016-04-11 | Discharge: 2016-04-11 | Disposition: A | Discharge status: Home / Self Care | Payer: Medicare Other | Source: Ambulatory Visit | Attending: Endocrinology | Admitting: Endocrinology

## 2016-04-11 ENCOUNTER — Other Ambulatory Visit (INDEPENDENT_AMBULATORY_CARE_PROVIDER_SITE_OTHER): Payer: Medicare Other

## 2016-04-11 ENCOUNTER — Other Ambulatory Visit: Payer: Medicare Other

## 2016-04-11 VITALS — BP 126/70 | HR 88 | Temp 98.6°F | Ht 69.0 in | Wt 175.0 lb

## 2016-04-11 DIAGNOSIS — D72829 Elevated white blood cell count, unspecified: Secondary | ICD-10-CM

## 2016-04-11 LAB — CBC WITH DIFFERENTIAL/PLATELET
BASOS ABS: 0 10*3/uL (ref 0.0–0.1)
BASOS PCT: 0.1 % (ref 0.0–3.0)
EOS ABS: 0.1 10*3/uL (ref 0.0–0.7)
Eosinophils Relative: 0.7 % (ref 0.0–5.0)
HCT: 33.5 % — ABNORMAL LOW (ref 39.0–52.0)
Hemoglobin: 10.7 g/dL — ABNORMAL LOW (ref 13.0–17.0)
Lymphocytes Relative: 9.9 % — ABNORMAL LOW (ref 12.0–46.0)
Lymphs Abs: 1.6 10*3/uL (ref 0.7–4.0)
MCHC: 32 g/dL (ref 30.0–36.0)
MCV: 81.3 fl (ref 78.0–100.0)
MONO ABS: 0.7 10*3/uL (ref 0.1–1.0)
Monocytes Relative: 4.2 % (ref 3.0–12.0)
NEUTROS ABS: 13.5 10*3/uL — AB (ref 1.4–7.7)
NEUTROS PCT: 85.1 % — AB (ref 43.0–77.0)
PLATELETS: 177 10*3/uL (ref 150.0–400.0)
RBC: 4.12 Mil/uL — ABNORMAL LOW (ref 4.22–5.81)
RDW: 15.7 % — AB (ref 11.5–15.5)
WBC: 15.9 10*3/uL — AB (ref 4.0–10.5)

## 2016-04-11 LAB — URINALYSIS, ROUTINE W REFLEX MICROSCOPIC
BILIRUBIN URINE: NEGATIVE
Hgb urine dipstick: NEGATIVE
KETONES UR: NEGATIVE
Leukocytes, UA: NEGATIVE
NITRITE: NEGATIVE
PH: 6 (ref 5.0–8.0)
URINE GLUCOSE: NEGATIVE
Urobilinogen, UA: 0.2 (ref 0.0–1.0)

## 2016-04-11 NOTE — Progress Notes (Signed)
Subjective:    Patient ID: Anthony [name], male    DOB: July 15, 1935, 80 y.o.   MRN: [id number]  HPI Pt states few days of slight bleeding from the mouth, but no assoc BRBPR.  Past Medical History:  Diagnosis Date  . ABNORMAL ELECTROCARDIOGRAM 05/03/2010  . Alzheimer disease   . DIABETES MELLITUS, TYPE II 03/15/2007  . GALLSTONES 03/15/2007  . GOUT 03/15/2007  . HYPERLIPIDEMIA 03/15/2007  . HYPERTENSION 03/15/2007  . NEPHROPATHY, DIABETIC 03/15/2007  . RENAL INSUFFICIENCY 04/30/2009  . SHOULDER PAIN, BILATERAL 04/29/2008  . UROLITHIASIS, HX OF 03/15/2007    Past Surgical History:  Procedure Laterality Date  . NASAL SEPTUM SURGERY  1978    Social History   Social History  . Marital status: Divorced    Spouse name: N/A  . Number of children: N/A  . Years of education: N/A   Occupational History  . Retired    Social History Main Topics  . Smoking status: Never Smoker  . Smokeless tobacco: Never Used  . Alcohol use No  . Drug use: No  . Sexual activity: No   Other Topics Concern  . Not on file   Social History Narrative   NOK is Daughter Sharol Given          Current Outpatient Prescriptions on File Prior to Visit  Medication Sig Dispense Refill  . acetaminophen (TYLENOL) 325 MG tablet Take 2 tablets (650 mg total) by mouth every 6 (six) hours as needed for mild pain, moderate pain or headache (or Fever >/= 101). 40 tablet 0  . allopurinol (ZYLOPRIM) 300 MG tablet TAKE ONE TABLET BY MOUTH ONCE DAILY 30 tablet 0  . aspirin 81 MG tablet Take 1 tablet (81 mg total) by mouth daily. 90 tablet 2  . colestipol (COLESTID) 5 g granules Take 5 g by mouth 2 (two) times daily. 500 g 12  . donepezil (ARICEPT) 10 MG tablet TAKE ONE TABLET BY MOUTH ONCE DAILY AT BEDTIME 30 tablet 0  . famotidine (PEPCID) 40 MG tablet Take 1 tablet (40 mg total) by mouth at bedtime. 30 tablet 1  . Glucose Blood (BAYER BREEZE 2 TEST) DISK Use twice daily to check Blood sugars (Patient not  taking: Reported on 04/10/2016) 200 each 0  . iron polysaccharides (NIFEREX) 150 MG capsule Take 1 capsule (150 mg total) by mouth 2 (two) times daily. 60 capsule 1  . isosorbide mononitrate (IMDUR) 30 MG 24 hr tablet Take 1 tablet (30 mg total) by mouth daily. 30 tablet 1  . losartan-hydrochlorothiazide (HYZAAR) 50-12.5 MG tablet 1/2 tab daily 15 tablet 11  . memantine (NAMENDA) 10 MG tablet TAKE ONE TABLET BY MOUTH TWICE DAILY 180 tablet 0  . pantoprazole (PROTONIX) 40 MG tablet Take 1 tablet (40 mg total) by mouth 2 (two) times daily. 60 tablet 1  . Vitamin D, Ergocalciferol, (DRISDOL) 50000 units CAPS capsule Take 1 capsule (50,000 Units total) by mouth 3 (three) times a week. 12 capsule 0   No current facility-administered medications on file prior to visit.     Allergies  Allergen Reactions  . Amlodipine-Atorvastatin Other (See Comments)    REACTION: LEG CRAMPS  . Benazepril Hcl Other (See Comments)    REACTION: Cough  . Ezetimibe Other (See Comments)    REACTION: edema    Family History  Problem Relation Age of Onset  . Cancer Father     Prostate Cancer  . Colon cancer Neg Hx   . Esophageal cancer Neg Hx   .  Stomach cancer Neg Hx     BP 126/70   Pulse 88   Temp 98.6 F (37 C) (Oral)   Ht 5\' 9"  (1.753 m)   Wt 175 lb (79.4 kg)   SpO2 97%   BMI 25.84 kg/m    Review of Systems Denies fever, dysuria, and cough.      Objective:   Physical Exam VITAL SIGNS:  See vs page GENERAL: no distress head: no deformity  eyes: no periorbital swelling, no proptosis.  external nose and ears are normal.  mouth: minimal amount of blood is seen.  No source is seen.  Partial dentures LUNGS:  Clear to auscultation.       Assessment & Plan:  Oral bleeding, new, uncertain etiology.  We discussed.  dtr says she is looking for a dentist for him to see. I told her I agree.  Leukocytosis, uncertain etiology.  HTN: well-controlled.   Patient is advised the following: Patient  Instructions  If you notice any more bleeding, please stop taking the aspirin.  Please continue the same medication for blood pressure.  blood tests and a chest x-ray are requested for you today.  We'll let you know about the results.   Please check you temperature twice a day.  Call if it is over 100.5.

## 2016-04-11 NOTE — Patient Instructions (Addendum)
If you notice any more bleeding, please stop taking the aspirin.  Please continue the same medication for blood pressure.  blood tests and a chest x-ray are requested for you today.  We'll let you know about the results.   Please check you temperature twice a day.  Call if it is over 100.5.

## 2016-04-12 ENCOUNTER — Encounter: Payer: Self-pay | Admitting: Neurology

## 2016-04-12 ENCOUNTER — Telehealth: Payer: Self-pay | Admitting: Endocrinology

## 2016-04-12 NOTE — Telephone Encounter (Signed)
To be advised.  

## 2016-04-12 NOTE — Telephone Encounter (Signed)
FYI pt denied home health PT

## 2016-04-25 ENCOUNTER — Other Ambulatory Visit: Payer: Self-pay | Admitting: Endocrinology

## 2016-05-21 NOTE — Progress Notes (Signed)
   Subjective:    Patient ID: Anthony [name], male    DOB: 12-12-1935, 80 y.o.   MRN: [id number]  HPI  Pt states 1 month of slight choking sensation in the throat, but no assoc pain.   dtr says blood coming from the mouth at last ov was actually fe tab. Past Medical History:  Diagnosis Date  . ABNORMAL ELECTROCARDIOGRAM 05/03/2010  . Alzheimer disease   . DIABETES MELLITUS, TYPE II 03/15/2007  . GALLSTONES 03/15/2007  . GOUT 03/15/2007  . HYPERLIPIDEMIA 03/15/2007  . HYPERTENSION 03/15/2007  . NEPHROPATHY, DIABETIC 03/15/2007  . RENAL INSUFFICIENCY 04/30/2009  . SHOULDER PAIN, BILATERAL 04/29/2008  . UROLITHIASIS, HX OF 03/15/2007    Past Surgical History:  Procedure Laterality Date  . NASAL SEPTUM SURGERY  1978    Social History   Social History  . Marital status: Divorced    Spouse name: N/A  . Number of children: N/A  . Years of education: N/A   Occupational History  . Retired    Social History Main Topics  . Smoking status: Never Smoker  . Smokeless tobacco: Never Used  . Alcohol use No  . Drug use: No  . Sexual activity: No   Other Topics Concern  . Not on file   Social History Narrative   NOK is Daughter Sharol GivenAngelia Wayne          Current Outpatient Prescriptions on File Prior to Visit  Medication Sig Dispense Refill  . acetaminophen (TYLENOL) 325 MG tablet Take 2 tablets (650 mg total) by mouth every 6 (six) hours as needed for mild pain, moderate pain or headache (or Fever >/= 101). 40 tablet 0  . aspirin 81 MG tablet Take 1 tablet (81 mg total) by mouth daily. 90 tablet 2  . famotidine (PEPCID) 40 MG tablet Take 1 tablet (40 mg total) by mouth at bedtime. 30 tablet 1   No current facility-administered medications on file prior to visit.     Allergies  Allergen Reactions  . Amlodipine-Atorvastatin Other (See Comments)    REACTION: LEG CRAMPS  . Benazepril Hcl Other (See Comments)    REACTION: Cough  . Ezetimibe Other (See Comments)    REACTION:  edema    Family History  Problem Relation Age of Onset  . Cancer Father     Prostate Cancer  . Colon cancer Neg Hx   . Esophageal cancer Neg Hx   . Stomach cancer Neg Hx     BP (!) 150/86   Pulse 71   Wt 170 lb 9.6 oz (77.4 kg)   SpO2 99%   BMI 25.19 kg/m    Review of Systems He has lost 5 lbs since last ov.  Denies sob.      Objective:   Physical Exam VITAL SIGNS:  See vs page GENERAL: no distress NECK: There is no palpable thyroid enlargement.  No thyroid nodule is palpable.  No palpable lymphadenopathy at the anterior neck. Gait: normal and steady.      Assessment & Plan:  Dysphagia, new, uncertain etiology.  I changed fe to liquid. Dementia: this may be causing hesitation to swallow.  HTN: as we recently reduced rx, Please continue the same medication for now.  Patient is advised the following: Patient Instructions  Let's check a swallowing test.  you will receive a phone call, about a day and time for an appointment.   Please come back for a follow-up appointment in 4 months.

## 2016-05-22 ENCOUNTER — Ambulatory Visit (INDEPENDENT_AMBULATORY_CARE_PROVIDER_SITE_OTHER): Payer: Medicare Other | Admitting: Endocrinology

## 2016-05-22 ENCOUNTER — Encounter: Payer: Self-pay | Admitting: Endocrinology

## 2016-05-22 DIAGNOSIS — R1310 Dysphagia, unspecified: Secondary | ICD-10-CM

## 2016-05-22 MED ORDER — MEMANTINE HCL 10 MG PO TABS: 10.0000 mg | ORAL_TABLET | Freq: Two times a day (BID) | ORAL | 3 refills | Status: DC

## 2016-05-22 MED ORDER — COLESTIPOL HCL 5 G PO GRAN: 5.0000 g | GRANULES | Freq: Two times a day (BID) | ORAL | 3 refills | Status: DC

## 2016-05-22 MED ORDER — DONEPEZIL HCL 10 MG PO TABS: 10.0000 mg | ORAL_TABLET | Freq: Every day | ORAL | 3 refills | Status: DC

## 2016-05-22 MED ORDER — FERROUS SULFATE 220 (44 FE) MG/5ML PO LIQD: 5.0000 mL | Freq: Two times a day (BID) | ORAL | 11 refills | Status: DC

## 2016-05-22 MED ORDER — ISOSORBIDE MONONITRATE ER 30 MG PO TB24: 30.0000 mg | ORAL_TABLET | Freq: Every day | ORAL | 3 refills | Status: DC

## 2016-05-22 MED ORDER — PANTOPRAZOLE SODIUM 40 MG PO TBEC: 40.0000 mg | DELAYED_RELEASE_TABLET | Freq: Two times a day (BID) | ORAL | 3 refills | Status: DC

## 2016-05-22 MED ORDER — ALLOPURINOL 300 MG PO TABS: 300.0000 mg | ORAL_TABLET | Freq: Every day | ORAL | 3 refills | Status: DC

## 2016-05-22 MED ORDER — LOSARTAN POTASSIUM-HCTZ 50-12.5 MG PO TABS: ORAL_TABLET | ORAL | 3 refills | Status: DC

## 2016-05-22 NOTE — Patient Instructions (Addendum)
Let's check a swallowing test.  you will receive a phone call, about a day and time for an appointment.   Please come back for a follow-up appointment in 4 months.

## 2016-05-26 ENCOUNTER — Ambulatory Visit
Admission: RE | Admit: 2016-05-26 | Discharge: 2016-05-26 | Disposition: A | Discharge status: Home / Self Care | Payer: Medicare Other | Source: Ambulatory Visit | Attending: Endocrinology | Admitting: Endocrinology

## 2016-05-26 DIAGNOSIS — R1310 Dysphagia, unspecified: Secondary | ICD-10-CM

## 2016-05-30 ENCOUNTER — Telehealth: Payer: Self-pay | Admitting: Endocrinology

## 2016-05-30 MED ORDER — ALLOPURINOL 300 MG PO TABS: 300.0000 mg | ORAL_TABLET | Freq: Every day | ORAL | 3 refills | Status: DC

## 2016-05-30 MED ORDER — DONEPEZIL HCL 10 MG PO TABS: 10.0000 mg | ORAL_TABLET | Freq: Every day | ORAL | 3 refills | Status: DC

## 2016-05-30 NOTE — Telephone Encounter (Signed)
Refill originally submitted on 05/22/2016 with confirmation the refills had been received by [facility] on [address] for a 90 day supply with three refills. Refills resubmitted on 05/30/2016 for a 90 day supply with 3 refills

## 2016-05-30 NOTE — Telephone Encounter (Signed)
Patient daughter called upset because her dad medications have not been sent to the pharmacy.  allopurinol (ZYLOPRIM) 300 MG tablet donepezil (ARICEPT) 10 MG tablet  90 day supply She stated TODAY!

## 2016-07-25 ENCOUNTER — Other Ambulatory Visit: Payer: Self-pay | Admitting: Endocrinology

## 2016-07-25 NOTE — Telephone Encounter (Signed)
Please advise if ok to refill. Thanks 

## 2016-08-08 ENCOUNTER — Ambulatory Visit (HOSPITAL_COMMUNITY): Payer: Medicare Other | Attending: Cardiovascular Disease

## 2016-08-08 ENCOUNTER — Other Ambulatory Visit: Payer: Self-pay

## 2016-08-08 DIAGNOSIS — I361 Nonrheumatic tricuspid (valve) insufficiency: Secondary | ICD-10-CM | POA: Diagnosis not present

## 2016-08-08 DIAGNOSIS — R011 Cardiac murmur, unspecified: Secondary | ICD-10-CM | POA: Insufficient documentation

## 2016-09-11 ENCOUNTER — Encounter: Payer: Self-pay | Admitting: Endocrinology

## 2016-09-11 ENCOUNTER — Ambulatory Visit (INDEPENDENT_AMBULATORY_CARE_PROVIDER_SITE_OTHER): Payer: Medicare Other | Admitting: Endocrinology

## 2016-09-11 VITALS — BP 132/76 | HR 83 | Ht 69.0 in | Wt 146.0 lb

## 2016-09-11 DIAGNOSIS — Z0000 Encounter for general adult medical examination without abnormal findings: Secondary | ICD-10-CM | POA: Diagnosis not present

## 2016-09-11 DIAGNOSIS — N183 Chronic kidney disease, stage 3 unspecified: Secondary | ICD-10-CM

## 2016-09-11 DIAGNOSIS — R1310 Dysphagia, unspecified: Secondary | ICD-10-CM

## 2016-09-11 DIAGNOSIS — D696 Thrombocytopenia, unspecified: Secondary | ICD-10-CM

## 2016-09-11 DIAGNOSIS — E559 Vitamin D deficiency, unspecified: Secondary | ICD-10-CM

## 2016-09-11 DIAGNOSIS — R739 Hyperglycemia, unspecified: Secondary | ICD-10-CM

## 2016-09-11 DIAGNOSIS — Z125 Encounter for screening for malignant neoplasm of prostate: Secondary | ICD-10-CM

## 2016-09-11 DIAGNOSIS — Z87442 Personal history of urinary calculi: Secondary | ICD-10-CM | POA: Diagnosis not present

## 2016-09-11 DIAGNOSIS — R413 Other amnesia: Secondary | ICD-10-CM

## 2016-09-11 DIAGNOSIS — D649 Anemia, unspecified: Secondary | ICD-10-CM | POA: Diagnosis not present

## 2016-09-11 DIAGNOSIS — M1A9XX0 Chronic gout, unspecified, without tophus (tophi): Secondary | ICD-10-CM

## 2016-09-11 LAB — CBC WITH DIFFERENTIAL/PLATELET
BASOS PCT: 0.6 % (ref 0.0–3.0)
Basophils Absolute: 0 10*3/uL (ref 0.0–0.1)
EOS ABS: 0.2 10*3/uL (ref 0.0–0.7)
Eosinophils Relative: 2.9 % (ref 0.0–5.0)
HCT: 38 % — ABNORMAL LOW (ref 39.0–52.0)
Hemoglobin: 11.9 g/dL — ABNORMAL LOW (ref 13.0–17.0)
Lymphocytes Relative: 17.5 % (ref 12.0–46.0)
Lymphs Abs: 1.1 10*3/uL (ref 0.7–4.0)
MCHC: 31.3 g/dL (ref 30.0–36.0)
MCV: 83.3 fl (ref 78.0–100.0)
MONO ABS: 0.4 10*3/uL (ref 0.1–1.0)
Monocytes Relative: 6.9 % (ref 3.0–12.0)
NEUTROS ABS: 4.3 10*3/uL (ref 1.4–7.7)
NEUTROS PCT: 72.1 % (ref 43.0–77.0)
PLATELETS: 175 10*3/uL (ref 150.0–400.0)
RBC: 4.57 Mil/uL (ref 4.22–5.81)
RDW: 17 % — AB (ref 11.5–15.5)
WBC: 6 10*3/uL (ref 4.0–10.5)

## 2016-09-11 LAB — URINALYSIS, ROUTINE W REFLEX MICROSCOPIC
Bilirubin Urine: NEGATIVE
Hgb urine dipstick: NEGATIVE
KETONES UR: NEGATIVE
LEUKOCYTES UA: NEGATIVE
Nitrite: NEGATIVE
PH: 5.5 (ref 5.0–8.0)
RBC / HPF: NONE SEEN (ref 0–?)
SPECIFIC GRAVITY, URINE: 1.015 (ref 1.000–1.030)
TOTAL PROTEIN, URINE-UPE24: NEGATIVE
URINE GLUCOSE: NEGATIVE
UROBILINOGEN UA: 0.2 (ref 0.0–1.0)

## 2016-09-11 LAB — IBC PANEL
Iron: 103 ug/dL (ref 42–165)
Saturation Ratios: 40.6 % (ref 20.0–50.0)
TRANSFERRIN: 181 mg/dL — AB (ref 212.0–360.0)

## 2016-09-11 LAB — BASIC METABOLIC PANEL
BUN: 31 mg/dL — ABNORMAL HIGH (ref 6–23)
CALCIUM: 9.4 mg/dL (ref 8.4–10.5)
CO2: 27 mEq/L (ref 19–32)
Chloride: 108 mEq/L (ref 96–112)
Creatinine, Ser: 1.94 mg/dL — ABNORMAL HIGH (ref 0.40–1.50)
GFR: 43.19 mL/min — AB (ref >60.00)
GLUCOSE: 131 mg/dL — AB (ref 70–99)
Potassium: 3.4 mEq/L — ABNORMAL LOW (ref 3.5–5.1)
SODIUM: 144 meq/L (ref 135–145)

## 2016-09-11 LAB — URIC ACID: URIC ACID, SERUM: 4.9 mg/dL (ref 4.0–7.8)

## 2016-09-11 LAB — HEMOGLOBIN A1C: Hgb A1c MFr Bld: 5.1 % (ref 4.6–6.5)

## 2016-09-11 LAB — PSA: PSA: 1.93 ng/mL (ref 0.10–4.00)

## 2016-09-11 LAB — VITAMIN B12: VITAMIN B 12: 406 pg/mL (ref 211–911)

## 2016-09-11 LAB — HEPATIC FUNCTION PANEL
ALBUMIN: 4 g/dL (ref 3.5–5.2)
ALK PHOS: 76 U/L (ref 39–117)
ALT: 10 U/L (ref 0–53)
AST: 14 U/L (ref 0–37)
Bilirubin, Direct: 0.2 mg/dL (ref 0.0–0.3)
TOTAL PROTEIN: 7.4 g/dL (ref 6.0–8.3)
Total Bilirubin: 1 mg/dL (ref 0.2–1.2)

## 2016-09-11 LAB — TSH: TSH: 1.57 u[IU]/mL (ref 0.35–4.50)

## 2016-09-11 LAB — LIPID PANEL
CHOL/HDL RATIO: 5
Cholesterol: 160 mg/dL (ref 0–200)
HDL: 35.1 mg/dL — ABNORMAL LOW (ref >39.00)
LDL CALC: 102 mg/dL — AB (ref 0–99)
NONHDL: 124.82
TRIGLYCERIDES: 112 mg/dL (ref 0.0–149.0)
VLDL: 22.4 mg/dL (ref 0.0–40.0)

## 2016-09-11 LAB — VITAMIN D 25 HYDROXY (VIT D DEFICIENCY, FRACTURES): VITD: 27.2 ng/mL — ABNORMAL LOW (ref 30.00–100.00)

## 2016-09-11 MED ORDER — DOXAZOSIN MESYLATE 4 MG PO TABS: 4.0000 mg | ORAL_TABLET | Freq: Every day | ORAL | 3 refills | Status: DC

## 2016-09-11 NOTE — Progress Notes (Signed)
Subjective:    Patient ID: Anthony [name], male    DOB: [birth year], 81 y.o.   MRN: [id number]  HPI Pt is here for regular wellness examination, and is feeling pretty well in general, and says chronic med probs are stable, except as noted below Past Medical History:  Diagnosis Date  . ABNORMAL ELECTROCARDIOGRAM 05/03/2010  . Alzheimer disease   . DIABETES MELLITUS, TYPE II 03/15/2007  . GALLSTONES 03/15/2007  . GOUT 03/15/2007  . HYPERLIPIDEMIA 03/15/2007  . HYPERTENSION 03/15/2007  . NEPHROPATHY, DIABETIC 03/15/2007  . RENAL INSUFFICIENCY 04/30/2009  . SHOULDER PAIN, BILATERAL 04/29/2008  . UROLITHIASIS, HX OF 03/15/2007    Past Surgical History:  Procedure Laterality Date  . NASAL SEPTUM SURGERY  1978    Social History   Social History  . Marital status: Divorced    Spouse name: N/A  . Number of children: N/A  . Years of education: N/A   Occupational History  . Retired    Social History Main Topics  . Smoking status: Never Smoker  . Smokeless tobacco: Never Used  . Alcohol use No  . Drug use: No  . Sexual activity: No   Other Topics Concern  . Not on file   Social History Narrative   NOK is Daughter Sharol Given          Current Outpatient Prescriptions on File Prior to Visit  Medication Sig Dispense Refill  . acetaminophen (TYLENOL) 325 MG tablet Take 2 tablets (650 mg total) by mouth every 6 (six) hours as needed for mild pain, moderate pain or headache (or Fever >/= 101). 40 tablet 0  . allopurinol (ZYLOPRIM) 300 MG tablet Take 1 tablet (300 mg total) by mouth daily. 90 tablet 3  . aspirin 81 MG tablet Take 1 tablet (81 mg total) by mouth daily. 90 tablet 2  . colestipol (COLESTID) 5 g granules Take 5 g by mouth 2 (two) times daily. 900 g 3  . donepezil (ARICEPT) 10 MG tablet Take 1 tablet (10 mg total) by mouth daily with breakfast. 90 tablet 3  . famotidine (PEPCID) 40 MG tablet Take 1 tablet (40 mg total) by mouth at bedtime. 30 tablet 1  . Ferrous  Sulfate 220 (44 Fe) MG/5ML LIQD Take 5 mLs by mouth 2 (two) times daily. 473 mL 11  . isosorbide mononitrate (IMDUR) 30 MG 24 hr tablet Take 1 tablet (30 mg total) by mouth daily. 90 tablet 3  . losartan-hydrochlorothiazide (HYZAAR) 50-12.5 MG tablet 1/2 tab daily 45 tablet 3  . memantine (NAMENDA) 10 MG tablet Take 1 tablet (10 mg total) by mouth 2 (two) times daily. 180 tablet 3  . pantoprazole (PROTONIX) 40 MG tablet Take 1 tablet (40 mg total) by mouth 2 (two) times daily. 180 tablet 3   No current facility-administered medications on file prior to visit.     Allergies  Allergen Reactions  . Amlodipine-Atorvastatin Other (See Comments)    REACTION: LEG CRAMPS  . Benazepril Hcl Other (See Comments)    REACTION: Cough  . Ezetimibe Other (See Comments)    REACTION: edema    Family History  Problem Relation Age of Onset  . Cancer Father     Prostate Cancer  . Colon cancer Neg Hx   . Esophageal cancer Neg Hx   . Stomach cancer Neg Hx     BP 132/76   Pulse 83   Ht 5\' 9"  (1.753 m)   Wt 146 lb (66.2 kg)   SpO2 93%  BMI 21.56 kg/m     Review of Systems Constitutional: Negative for fever, but weight loss is worse.  HENT: Negative for tinnitus.   Eyes: Negative for photophobia.  Respiratory: Negative for shortness of breath.   Cardiovascular: Negative for chest pain.  Gastrointestinal: Negative for blood in stool.  Endocrine: Positive for cold intolerance.  Genitourinary: Negative for hematuria.  Musculoskeletal: Negative for back pain.  Skin: Negative for rash.  Allergic/Immunologic: positive for environmental allergies.  Neurological: Negative for syncope, numbness and headaches.   Hematological: Does not bruise/bleed easily.  Psychiatric/Behavioral: posittive for sleep disturbance.     Objective:   Physical Exam VS: see vs page GEN: no distress HEAD: head: no deformity eyes: no periorbital swelling, no proptosis external nose and ears are normal mouth: no  lesion seen NECK: supple, thyroid is not enlarged CHEST WALL: no deformity LUNGS: clear to auscultation, except for rales at the left base BREASTS:  bilat pseudogynecomastia. CV: reg rate and rhythm, no murmur ABD: abdomen is soft, nontender.  no hepatosplenomegaly.  not distended.  no hernia RECTAL/PROSTATE: declined MUSCULOSKELETAL: muscle bulk and strength are grossly normal.  no obvious joint swelling.  gait is normal and steady EXTEMITIES: no deformity.  no ulcer on the feet.  feet are of normal color and temp.  no edema PULSES: dorsalis pedis intact bilat.  no carotid bruit NEURO:  cn 2-12 grossly intact.   readily moves all 4's.  sensation is intact to touch on the feet SKIN:  Normal texture and temperature.  No rash or suspicious lesion is visible.   NODES:  None palpable at the neck.   PSYCH: alert.  Does not appear anxious nor depressed.        Assessment & Plan:  Wellness visit today, with problems stable, except as noted.   Subjective:   Patient here for Medicare annual wellness visit and management of other chronic and acute problems.     Risk factors: advanced age    Roster of Physicians Providing Medical Care to Patient:  See "snapshot"   Activities of Daily Living: In your present state of health, do you have any difficulty performing the following activities (lives with dtr)?:  Preparing food and eating?: yes  Bathing yourself: yes Getting dressed: No  Using the toilet: No  Moving around from place to place: No  In the past year have you fallen or had a near fall?: No    Home Safety: Has smoke detector and wears seat belts. No firearms.   Diet and Exercise  Current exercise habits: limited by health probs Dietary issues discussed: limited by dysphagia   Depression Screen  Q1: Over the past two weeks, have you felt down, depressed or hopeless? dtr says pt has some depression.   Q2: Over the past two weeks, have you felt little interest or pleasure in doing  things? no   The following portions of the patient's history were reviewed and updated as appropriate: allergies, current medications, past family history, past medical history, past social history, past surgical history and problem list.   Review of Systems  Denies hearing loss, and visual loss.  Objective:   Vision:  [place] opthalmologist, so [facility] not needed today.  Hearing: grossly normal Body mass index:  See vs page Msk: pt slowly performs "get-up-and-go" from a sitting position Cognitive Impairment Assessment: cognition, memory and judgment appear subnormal.  remembers 0/3 at 0 minutes.  poor recall.  Alert, but oriented to self only   Assessment:   Medicare  wellness utd on preventive parameters.     Plan:   During the course of the visit the patient was educated and counseled about appropriate screening and preventive services including:        Fall prevention   Diabetes screening  Nutrition counseling   LABS are ordered Vaccines are UTD today   Patient Instructions (the written plan) was given to the patient.

## 2016-09-11 NOTE — Patient Instructions (Addendum)
blood tests are requested for you today.  We'll let you know about the results.  Please consider these measures for your health:  minimize alcohol.  Do not use tobacco products.  Have a colonoscopy at least every 10 years from age 79.  Keep firearms safely stored.  Always use seat belts.  have working smoke alarms in your home.  See an eye doctor and dentist regularly.  Never drive under the influence of alcohol or drugs (including prescription drugs).   please let me know what your wishes would be, if artificial life support measures should become necessary.  It is critically important to prevent falling down (keep floor areas well-lit, dry, and free of loose objects.  If you have a cane, walker, or wheelchair, you should use it, even for short trips around the house.  Wear flat-soled shoes.  Also, try not to rush).   Please come back for a follow-up appointment in 4 months.

## 2016-09-12 LAB — PTH, INTACT AND CALCIUM
Calcium: 9.3 mg/dL (ref 8.6–10.3)
PTH: 59 pg/mL (ref 14–64)

## 2016-09-25 ENCOUNTER — Ambulatory Visit (INDEPENDENT_AMBULATORY_CARE_PROVIDER_SITE_OTHER): Payer: Medicare Other | Admitting: Endocrinology

## 2016-09-25 ENCOUNTER — Encounter: Payer: Self-pay | Admitting: Endocrinology

## 2016-09-25 VITALS — BP 122/84 | HR 66 | Ht 69.0 in | Wt 136.0 lb

## 2016-09-25 DIAGNOSIS — R413 Other amnesia: Secondary | ICD-10-CM | POA: Diagnosis not present

## 2016-09-25 NOTE — Patient Instructions (Addendum)
Please increase the Ensure to 6 containers per day.   Please call to schedule a follow up appointment with the neurology specialist [phone].   Please come back for a follow-up appointment in 1 month.   Please stop taking the colestipol and isosorbide medications.

## 2016-09-25 NOTE — Progress Notes (Signed)
Subjective:    Patient ID: Anthony [name], male    DOB: Apr 12, 1936, 81 y.o.   MRN: [id number]  HPI Pt returns for f/u of dementia and decreased PO intake.  dtr says she was contacted by dietician, who told her she had nothing to offer.  Pt says he does not have a choking sensation in the neck.  She says pt drinks through a straw just fine.  She gives him ensure, 3-4 servings per day.   Past Medical History:  Diagnosis Date  . ABNORMAL ELECTROCARDIOGRAM 05/03/2010  . Alzheimer disease   . DIABETES MELLITUS, TYPE II 03/15/2007  . GALLSTONES 03/15/2007  . GOUT 03/15/2007  . HYPERLIPIDEMIA 03/15/2007  . HYPERTENSION 03/15/2007  . NEPHROPATHY, DIABETIC 03/15/2007  . RENAL INSUFFICIENCY 04/30/2009  . SHOULDER PAIN, BILATERAL 04/29/2008  . UROLITHIASIS, HX OF 03/15/2007    Past Surgical History:  Procedure Laterality Date  . NASAL SEPTUM SURGERY  1978    Social History   Social History  . Marital status: Divorced    Spouse name: N/A  . Number of children: N/A  . Years of education: N/A   Occupational History  . Retired    Social History Main Topics  . Smoking status: Never Smoker  . Smokeless tobacco: Never Used  . Alcohol use No  . Drug use: No  . Sexual activity: No   Other Topics Concern  . Not on file   Social History Narrative   NOK is Daughter Sharol Given          Current Outpatient Prescriptions on File Prior to Visit  Medication Sig Dispense Refill  . acetaminophen (TYLENOL) 325 MG tablet Take 2 tablets (650 mg total) by mouth every 6 (six) hours as needed for mild pain, moderate pain or headache (or Fever >/= 101). 40 tablet 0  . allopurinol (ZYLOPRIM) 300 MG tablet Take 1 tablet (300 mg total) by mouth daily. 90 tablet 3  . aspirin 81 MG tablet Take 1 tablet (81 mg total) by mouth daily. 90 tablet 2  . donepezil (ARICEPT) 10 MG tablet Take 1 tablet (10 mg total) by mouth daily with breakfast. 90 tablet 3  . doxazosin (CARDURA) 4 MG tablet Take 1 tablet  (4 mg total) by mouth daily. 90 tablet 3  . famotidine (PEPCID) 40 MG tablet Take 1 tablet (40 mg total) by mouth at bedtime. 30 tablet 1  . Ferrous Sulfate 220 (44 Fe) MG/5ML LIQD Take 5 mLs by mouth 2 (two) times daily. 473 mL 11  . losartan-hydrochlorothiazide (HYZAAR) 50-12.5 MG tablet 1/2 tab daily 45 tablet 3  . memantine (NAMENDA) 10 MG tablet Take 1 tablet (10 mg total) by mouth 2 (two) times daily. 180 tablet 3  . pantoprazole (PROTONIX) 40 MG tablet Take 1 tablet (40 mg total) by mouth 2 (two) times daily. 180 tablet 3   No current facility-administered medications on file prior to visit.     Allergies  Allergen Reactions  . Amlodipine-Atorvastatin Other (See Comments)    REACTION: LEG CRAMPS  . Benazepril Hcl Other (See Comments)    REACTION: Cough  . Ezetimibe Other (See Comments)    REACTION: edema    Family History  Problem Relation Age of Onset  . Cancer Father     Prostate Cancer  . Colon cancer Neg Hx   . Esophageal cancer Neg Hx   . Stomach cancer Neg Hx     BP 122/84   Pulse 66   Ht  (  1.753 m)   Wt 136 lb (61.7 kg)   BMI 20.08 kg/m    Review of Systems He has lost 10 more lbs.  He has slight lightheadedness.      Objective:   Physical Exam VITAL SIGNS:  See vs page.   GENERAL: no distress.  NECK: There is no palpable thyroid enlargement.  No thyroid nodule is palpable.  No palpable lymphadenopathy at the anterior neck.       Assessment & Plan:  Dementia: apparently worse Weight loss, persistent. HTN: slightly overcontrolled Dyslipidemia: weight loss will decrease med requirement.   Patient Instructions  Please increase the Ensure to 6 containers per day.   Please call to schedule a follow up appointment with the neurology specialist [phone].   Please come back for a follow-up appointment in 1 month.   Please stop taking the colestipol and isosorbide medications.

## 2016-09-27 ENCOUNTER — Inpatient Hospital Stay (HOSPITAL_COMMUNITY)
Admission: EM | Admit: 2016-09-27 | Discharge: 2016-10-01 | DRG: 682 | Disposition: A | Discharge status: Skilled Nursing Facility | Payer: Medicare Other | Attending: Internal Medicine | Admitting: Internal Medicine

## 2016-09-27 ENCOUNTER — Encounter (HOSPITAL_COMMUNITY): Payer: Self-pay | Admitting: Emergency Medicine

## 2016-09-27 ENCOUNTER — Inpatient Hospital Stay (HOSPITAL_COMMUNITY): Payer: Medicare Other

## 2016-09-27 DIAGNOSIS — E1121 Type 2 diabetes mellitus with diabetic nephropathy: Secondary | ICD-10-CM | POA: Diagnosis present

## 2016-09-27 DIAGNOSIS — E876 Hypokalemia: Secondary | ICD-10-CM | POA: Diagnosis present

## 2016-09-27 DIAGNOSIS — Z888 Allergy status to other drugs, medicaments and biological substances status: Secondary | ICD-10-CM | POA: Diagnosis not present

## 2016-09-27 DIAGNOSIS — N183 Chronic kidney disease, stage 3 (moderate): Secondary | ICD-10-CM | POA: Diagnosis present

## 2016-09-27 DIAGNOSIS — Z9889 Other specified postprocedural states: Secondary | ICD-10-CM

## 2016-09-27 DIAGNOSIS — I9589 Other hypotension: Secondary | ICD-10-CM

## 2016-09-27 DIAGNOSIS — Z8042 Family history of malignant neoplasm of prostate: Secondary | ICD-10-CM | POA: Diagnosis not present

## 2016-09-27 DIAGNOSIS — Z515 Encounter for palliative care: Secondary | ICD-10-CM | POA: Diagnosis not present

## 2016-09-27 DIAGNOSIS — Z79899 Other long term (current) drug therapy: Secondary | ICD-10-CM

## 2016-09-27 DIAGNOSIS — N189 Chronic kidney disease, unspecified: Secondary | ICD-10-CM | POA: Diagnosis not present

## 2016-09-27 DIAGNOSIS — N179 Acute kidney failure, unspecified: Secondary | ICD-10-CM

## 2016-09-27 DIAGNOSIS — K219 Gastro-esophageal reflux disease without esophagitis: Secondary | ICD-10-CM | POA: Diagnosis present

## 2016-09-27 DIAGNOSIS — R197 Diarrhea, unspecified: Secondary | ICD-10-CM | POA: Diagnosis present

## 2016-09-27 DIAGNOSIS — E43 Unspecified severe protein-calorie malnutrition: Secondary | ICD-10-CM | POA: Diagnosis present

## 2016-09-27 DIAGNOSIS — M109 Gout, unspecified: Secondary | ICD-10-CM | POA: Diagnosis present

## 2016-09-27 DIAGNOSIS — E860 Dehydration: Secondary | ICD-10-CM | POA: Diagnosis present

## 2016-09-27 DIAGNOSIS — I129 Hypertensive chronic kidney disease with stage 1 through stage 4 chronic kidney disease, or unspecified chronic kidney disease: Secondary | ICD-10-CM | POA: Diagnosis present

## 2016-09-27 DIAGNOSIS — D72829 Elevated white blood cell count, unspecified: Secondary | ICD-10-CM | POA: Diagnosis not present

## 2016-09-27 DIAGNOSIS — F0281 Dementia in other diseases classified elsewhere with behavioral disturbance: Secondary | ICD-10-CM | POA: Diagnosis present

## 2016-09-27 DIAGNOSIS — G309 Alzheimer's disease, unspecified: Secondary | ICD-10-CM | POA: Diagnosis present

## 2016-09-27 DIAGNOSIS — D631 Anemia in chronic kidney disease: Secondary | ICD-10-CM | POA: Diagnosis present

## 2016-09-27 DIAGNOSIS — D696 Thrombocytopenia, unspecified: Secondary | ICD-10-CM | POA: Diagnosis not present

## 2016-09-27 DIAGNOSIS — I959 Hypotension, unspecified: Secondary | ICD-10-CM

## 2016-09-27 DIAGNOSIS — R1310 Dysphagia, unspecified: Secondary | ICD-10-CM | POA: Diagnosis not present

## 2016-09-27 DIAGNOSIS — N400 Enlarged prostate without lower urinary tract symptoms: Secondary | ICD-10-CM | POA: Diagnosis present

## 2016-09-27 DIAGNOSIS — R112 Nausea with vomiting, unspecified: Secondary | ICD-10-CM

## 2016-09-27 DIAGNOSIS — Z7189 Other specified counseling: Secondary | ICD-10-CM | POA: Diagnosis not present

## 2016-09-27 DIAGNOSIS — E1122 Type 2 diabetes mellitus with diabetic chronic kidney disease: Secondary | ICD-10-CM | POA: Diagnosis present

## 2016-09-27 DIAGNOSIS — F0280 Dementia in other diseases classified elsewhere without behavioral disturbance: Secondary | ICD-10-CM | POA: Diagnosis not present

## 2016-09-27 DIAGNOSIS — Z7982 Long term (current) use of aspirin: Secondary | ICD-10-CM | POA: Diagnosis not present

## 2016-09-27 LAB — URINALYSIS, ROUTINE W REFLEX MICROSCOPIC
BILIRUBIN URINE: NEGATIVE
Glucose, UA: NEGATIVE mg/dL
Hgb urine dipstick: NEGATIVE
Ketones, ur: NEGATIVE mg/dL
LEUKOCYTES UA: NEGATIVE
NITRITE: NEGATIVE
PH: 5 (ref 5.0–8.0)
Protein, ur: NEGATIVE mg/dL
SPECIFIC GRAVITY, URINE: 1.019 (ref 1.005–1.030)

## 2016-09-27 LAB — COMPREHENSIVE METABOLIC PANEL WITH GFR
ALT: 11 U/L — ABNORMAL LOW (ref 17–63)
AST: 44 U/L — ABNORMAL HIGH (ref 15–41)
Albumin: 3.5 g/dL (ref 3.5–5.0)
Alkaline Phosphatase: 75 U/L (ref 38–126)
Anion gap: 10 (ref 5–15)
BUN: 72 mg/dL — ABNORMAL HIGH (ref 6–20)
CO2: 19 mmol/L — ABNORMAL LOW (ref 22–32)
Calcium: 8.9 mg/dL (ref 8.9–10.3)
Chloride: 116 mmol/L — ABNORMAL HIGH (ref 101–111)
Creatinine, Ser: 3.88 mg/dL — ABNORMAL HIGH (ref 0.61–1.24)
GFR calc Af Amer: 16 mL/min — ABNORMAL LOW (ref >60)
GFR calc non Af Amer: 14 mL/min — ABNORMAL LOW (ref >60)
Glucose, Bld: 132 mg/dL — ABNORMAL HIGH (ref 65–99)
Potassium: 4.2 mmol/L (ref 3.5–5.1)
Sodium: 145 mmol/L (ref 135–145)
Total Bilirubin: 1.8 mg/dL — ABNORMAL HIGH (ref 0.3–1.2)
Total Protein: 7 g/dL (ref 6.5–8.1)

## 2016-09-27 LAB — CBC
HEMATOCRIT: 36.6 % — AB (ref 39.0–52.0)
HEMOGLOBIN: 12.3 g/dL — AB (ref 13.0–17.0)
MCH: 26.7 pg (ref 26.0–34.0)
MCHC: 33.6 g/dL (ref 30.0–36.0)
MCV: 79.4 fL (ref 78.0–100.0)
Platelets: 142 10*3/uL — ABNORMAL LOW (ref 150–400)
RBC: 4.61 MIL/uL (ref 4.22–5.81)
RDW: 16 % — ABNORMAL HIGH (ref 11.5–15.5)
WBC: 16.1 10*3/uL — ABNORMAL HIGH (ref 4.0–10.5)

## 2016-09-27 LAB — LIPASE, BLOOD: Lipase: 68 U/L — ABNORMAL HIGH (ref 11–51)

## 2016-09-27 LAB — TSH: TSH: 0.913 u[IU]/mL (ref 0.350–4.500)

## 2016-09-27 MED ORDER — ALBUTEROL SULFATE (2.5 MG/3ML) 0.083% IN NEBU: 2.5000 mg | INHALATION_SOLUTION | RESPIRATORY_TRACT | Status: DC | PRN

## 2016-09-27 MED ORDER — ACETAMINOPHEN 650 MG RE SUPP: 650.0000 mg | Freq: Four times a day (QID) | RECTAL | Status: DC | PRN

## 2016-09-27 MED ORDER — DONEPEZIL HCL 10 MG PO TABS
10.0000 mg | ORAL_TABLET | Freq: Every day | ORAL | Status: DC
  Administered 2016-09-28 – 2016-10-01 (×4): 10 mg via ORAL
  Filled 2016-09-27 (×4): qty 1

## 2016-09-27 MED ORDER — SODIUM CHLORIDE 0.9 % IV BOLUS (SEPSIS)
1000.0000 mL | Freq: Once | INTRAVENOUS | Status: AC
  Administered 2016-09-27: 1000 mL via INTRAVENOUS

## 2016-09-27 MED ORDER — FAMOTIDINE 20 MG PO TABS
40.0000 mg | ORAL_TABLET | ORAL | Status: DC
  Administered 2016-09-28: 40 mg via ORAL
  Filled 2016-09-27: qty 2

## 2016-09-27 MED ORDER — ALLOPURINOL 100 MG PO TABS
100.0000 mg | ORAL_TABLET | Freq: Every day | ORAL | Status: DC
  Administered 2016-09-28 – 2016-10-01 (×4): 100 mg via ORAL
  Filled 2016-09-27 (×4): qty 1

## 2016-09-27 MED ORDER — HEPARIN SODIUM (PORCINE) 5000 UNIT/ML IJ SOLN
5000.0000 [IU] | Freq: Three times a day (TID) | INTRAMUSCULAR | Status: DC
  Administered 2016-09-27 – 2016-10-01 (×12): 5000 [IU] via SUBCUTANEOUS
  Filled 2016-09-27 (×10): qty 1

## 2016-09-27 MED ORDER — ONDANSETRON HCL 4 MG PO TABS: 4.0000 mg | ORAL_TABLET | Freq: Four times a day (QID) | ORAL | Status: DC | PRN

## 2016-09-27 MED ORDER — DOXAZOSIN MESYLATE 4 MG PO TABS
4.0000 mg | ORAL_TABLET | Freq: Every day | ORAL | Status: DC
  Administered 2016-09-28 – 2016-10-01 (×4): 4 mg via ORAL
  Filled 2016-09-27 (×4): qty 1

## 2016-09-27 MED ORDER — ACETAMINOPHEN 325 MG PO TABS: 650.0000 mg | ORAL_TABLET | Freq: Four times a day (QID) | ORAL | Status: DC | PRN

## 2016-09-27 MED ORDER — ONDANSETRON HCL 4 MG/2ML IJ SOLN: 4.0000 mg | Freq: Four times a day (QID) | INTRAMUSCULAR | Status: DC | PRN

## 2016-09-27 MED ORDER — PANTOPRAZOLE SODIUM 40 MG PO TBEC
40.0000 mg | DELAYED_RELEASE_TABLET | Freq: Every day | ORAL | Status: DC
  Administered 2016-09-28 – 2016-10-01 (×4): 40 mg via ORAL
  Filled 2016-09-27 (×5): qty 1

## 2016-09-27 MED ORDER — ONDANSETRON HCL 4 MG/2ML IJ SOLN: 4.0000 mg | Freq: Once | INTRAMUSCULAR | Status: DC | PRN

## 2016-09-27 MED ORDER — ASPIRIN EC 81 MG PO TBEC
81.0000 mg | DELAYED_RELEASE_TABLET | Freq: Every day | ORAL | Status: DC
  Administered 2016-09-28 – 2016-10-01 (×4): 81 mg via ORAL
  Filled 2016-09-27 (×4): qty 1

## 2016-09-27 MED ORDER — LORAZEPAM 2 MG/ML IJ SOLN
0.5000 mg | Freq: Once | INTRAMUSCULAR | Status: AC
  Administered 2016-09-27: 0.5 mg via INTRAVENOUS
  Filled 2016-09-27: qty 1

## 2016-09-27 MED ORDER — SODIUM CHLORIDE 0.9 % IV SOLN
INTRAVENOUS | Status: DC
  Administered 2016-09-27 – 2016-10-01 (×6): via INTRAVENOUS

## 2016-09-27 MED ORDER — MEMANTINE HCL 10 MG PO TABS
10.0000 mg | ORAL_TABLET | Freq: Every day | ORAL | Status: DC
  Administered 2016-09-28 – 2016-10-01 (×4): 10 mg via ORAL
  Filled 2016-09-27 (×4): qty 1

## 2016-09-27 NOTE — ED Notes (Signed)
Attempted to call report to floor with no answer.

## 2016-09-27 NOTE — ED Notes (Signed)
Daughter at bedside.  States patient cannot tolerate PO medications due to patient pocketing food and fluids in his mouth.  States things must be IV. Several blankets placed on patient per MD order

## 2016-09-27 NOTE — ED Triage Notes (Signed)
Pt comes from home via EMS with complaints of nausea, vomiting, and diarrhea since Saturday.  Pt displaying hypotension at this time. BP in route 76/56. On 2 L [place] due to saturation of 89% in route.  Hx of dementia. Family reports he is at baseline. 30 lb weight loss reported over the last month. Last oral intake was this morning but came right back up. 300 cc given in route. CBG 166.

## 2016-09-27 NOTE — Progress Notes (Signed)
CSW spoke to pt and pt's family who stated their and the pt's wishes are that pt's daughter Ezekiah Massie, pt's daughter is P.O.A.  Angelia Stender at [place] , pt and pt's and family states she must be present for all medical decisions.  Dorothe Pea. Dafna Romo, Theresia Majors, LCAS Clinical Social Worker Ph: [phone]

## 2016-09-27 NOTE — Clinical Social Work Note (Signed)
Clinical Social Work Assessment  Patient Details  Name: Anthony [name] MRN: [id number] Date of Birth: [birth year]  Date of referral:  09/27/16               Reason for consult:  Facility Placement                Permission sought to share information with:  Facility Art therapist granted to share information::  Yes, Verbal Permission Granted  Name::        Agency::     Relationship::     Contact Information:     Housing/Transportation Living arrangements for the past 2 months:  [place] of Information:  Facility Patient Interpreter Needed:  None Criminal Activity/Legal Involvement Pertinent to Current Situation/Hospitalization:    Significant Relationships:  Adult Children Lives with:  Self Do you feel safe going back to the place where you live?  Yes Need for family participation in patient care:  Yes (Comment) (Anthony [name] is P.O.A.  Anthony  and family states she must be present for all medical decisions)  Care giving concerns:  Anthony [name], pt's daughter is P.O.A.  Anthony  and family states she must be present for all medical decisions   Social Worker assessment / plan:  CSW met with pt and pt's sister Anthony [name] and spoke to pt's daughter Anthony [name] and all stated Anthony at ph: [phone] must be present for all medical decisions.  Pt and pt's family confirmed pt's plan to be discharged to SNF or Home with Home Health to live at discharge, pending their decision and the PT recommendation .  CSW provided active listening and validated pt's daughter's concerns that she be present for all decisions.   Daughter's and pt gave verbal permission for CSW Dept to complete FL-2 and send referrals out to [place]  facilities via the hub per pt's request. Pt's daughter says she wants the highest-rated SNF despite it''s location.  Pt has been living independently prior to being [facility] to [facility].   Employment status:   Retired Nurse, adult PT Recommendations:  Not assessed at this time Information / Referral to community resources:     Patient/Family's Response to care:  Patient has Alzheimer's.  Patient and pt's family agreeable to plan.  Pt's daughters supportive and strongly involved in pt.'s care.  Pt and pt's family pleasant and appreciated CSW intervention.    Patient/Family's Understanding of and Emotional Response to Diagnosis, Current Treatment, and Prognosis:  Still assessing  Emotional Assessment Appearance:  Appears stated age Attitude/Demeanor/Rapport:    Affect (typically observed):  Unable to Assess (Pt did not speak except to say thank you. Pt has Alzheimer's) Orientation:  Oriented to Self (Alzheimer's) Alcohol / Substance use:    Psych involvement (Current and /or in the community):     Discharge Needs  Concerns to be addressed:  No discharge needs identified Readmission within the last 30 days:  No Current discharge risk:  None Barriers to Discharge:  No Barriers Identified (Anthony [name] is P.O.A.  Anthony  and family states she must be present for all medical decisions)   Claudine Mouton, LCSWA 09/27/2016, 10:18 PM

## 2016-09-27 NOTE — ED Notes (Signed)
Patient transported to X-ray 

## 2016-09-27 NOTE — H&P (Signed)
History and Physical    Anthony [name] ZOX:[id number] DOB: [birth year] DOA: 09/27/2016  Referring MD/NP/PA: Dr. Eudelia Bunch PCP: Romero Belling, MD  Patient coming from: Home via EMS  Chief Complaint: vomiting and diarrhea  HPI: Anthony [name] is a 81 y.o. male with medical history significant of HTN, Alzheimer's dementia, DM type II, BPH, CKD stage III; who presents with complaints of nausea, vomiting, and diarrhea for the last 4 days. Patient has dementia and much of history is obtained from the patient's daughter. At baseline patient lives with his daughter at home who is his primary caregiver. Over the last month or so the patient has had at least a 30 pound weight loss. He has had decreased appetite and difficulty remembering to chew and swallow his food. Family has been trying to supplement food with Ensure shakes. Patient has had no recent sick contacts to her knowledge. He has been vomiting his food and medications. She reports anywhere from 4-5 episodes of diarrhea daily. Symptoms have waxed and waned in intensity. They had not tried anything to treat symptoms of nausea, vomiting, or diarrhea. Associated symptoms including incontinence for which she had to recently by the patient adult briefs in the last 1-2 weeks Denies any significant dysuria, blood in stool, abdominal pain, chest pain, cough, shortness of breath, or recent antibiotic. In route with EMS patient's systolic blood pressure was noted to be in the 60s for which he was given an initial bolus of fluids.  ED Course: Upon admission into the emergency department patient was noted to have a temperature of 96.26F, pulse 98-100, respirations 13-21, blood pressure as low as 94/73, and O2 saturations maintained on 2 L of [place] oxygen. Labs revealed WBC 16.1, hemoglobin 12.3, platelets 142, BUN 72, creatinine 3.88, AST 44, ALT 11, BUN 1.8, lipase 68, and all other values relatively within normal limits. While in the ED patient was given 1 L of  normal saline IV fluids and 4 mg of Zofran. Bladder scan showed only approximately 100 ml of urine present.  Review of Systems: As per HPI otherwise, complete 10 point review of systems unable to be obtained secondary to dementia.  Past Medical History:  Diagnosis Date  . ABNORMAL ELECTROCARDIOGRAM 05/03/2010  . Alzheimer disease   . DIABETES MELLITUS, TYPE II 03/15/2007  . GALLSTONES 03/15/2007  . GOUT 03/15/2007  . HYPERLIPIDEMIA 03/15/2007  . HYPERTENSION 03/15/2007  . NEPHROPATHY, DIABETIC 03/15/2007  . RENAL INSUFFICIENCY 04/30/2009  . SHOULDER PAIN, BILATERAL 04/29/2008  . UROLITHIASIS, HX OF 03/15/2007    Past Surgical History:  Procedure Laterality Date  . NASAL SEPTUM SURGERY  1978     reports that he has never smoked. He has never used smokeless tobacco. He reports that he does not drink alcohol or use drugs.  Allergies  Allergen Reactions  . Amlodipine-Atorvastatin Other (See Comments)    REACTION: LEG CRAMPS  . Benazepril Hcl Other (See Comments)    REACTION: Cough  . Ezetimibe Other (See Comments)    REACTION: edema    Family History  Problem Relation Age of Onset  . Cancer Father     Prostate Cancer  . Colon cancer Neg Hx   . Esophageal cancer Neg Hx   . Stomach cancer Neg Hx     Prior to Admission medications   Medication Sig Start Date End Date Taking? Authorizing Provider  allopurinol (ZYLOPRIM) 300 MG tablet Take 1 tablet (300 mg total) by mouth daily. 05/30/16  Yes Romero Belling, MD  aspirin 81  MG tablet Take 1 tablet (81 mg total) by mouth daily. 07/03/12  Yes Romero Belling, MD  colestipol (COLESTID) 1 g tablet Take 5 g by mouth daily.   Yes Historical Provider, MD  donepezil (ARICEPT) 10 MG tablet Take 1 tablet (10 mg total) by mouth daily with breakfast. 05/30/16  Yes Romero Belling, MD  doxazosin (CARDURA) 4 MG tablet Take 1 tablet (4 mg total) by mouth daily. 09/11/16  Yes Romero Belling, MD  famotidine (PEPCID) 40 MG tablet Take 1 tablet (40 mg total) by  mouth at bedtime. Patient taking differently: Take 40 mg by mouth daily.  02/24/16  Yes Vassie Loll, MD  Ferrous Sulfate 220 (44 Fe) MG/5ML LIQD Take 5 mLs by mouth 2 (two) times daily. 05/22/16  Yes Romero Belling, MD  losartan-hydrochlorothiazide (HYZAAR) 50-12.5 MG tablet 1/2 tab daily Patient taking differently: Take 0.5 tablets by mouth daily. 1/2 tab daily 05/22/16  Yes Romero Belling, MD  memantine (NAMENDA) 10 MG tablet Take 1 tablet (10 mg total) by mouth 2 (two) times daily. Patient taking differently: Take 10 mg by mouth daily.  05/22/16  Yes Romero Belling, MD  pantoprazole (PROTONIX) 40 MG tablet Take 1 tablet (40 mg total) by mouth 2 (two) times daily. Patient taking differently: Take 40 mg by mouth daily.  05/22/16  Yes Romero Belling, MD  acetaminophen (TYLENOL) 325 MG tablet Take 2 tablets (650 mg total) by mouth every 6 (six) hours as needed for mild pain, moderate pain or headache (or Fever >/= 101). Patient not taking: Reported on 09/27/2016 02/24/16   Vassie Loll, MD    Physical Exam:  Constitutional: Thin elderly male NAD, calm, comfortable Vitals:   09/27/16 1915 09/27/16 1927 09/27/16 1930 09/27/16 1945  BP: (!) 139/54  108/78 109/70  Pulse:      Resp: Temp:  97.9 F (36.6 C)    TempSrc:  Oral    SpO2:       Eyes: PERRL, lids and conjunctivae normal ENMT: Mucous membranes are dry. Posterior pharynx clear of any exudate or lesions. Neck: normal, supple, no masses, no thyromegaly Respiratory: clear to auscultation bilaterally, no wheezing, no crackles. Normal respiratory effort. No accessory muscle use.  Cardiovascular: Regular rate and rhythm, no murmurs / rubs / gallops. No extremity edema. 2+ pedal pulses. No carotid bruits.  Abdomen: no tenderness, no masses palpated. No hepatosplenomegaly. Bowel sounds positive.  Musculoskeletal: no clubbing / cyanosis. No joint deformity upper and lower extremities. Good ROM, no contractures. Normal muscle tone.  Skin: no  rashes, lesions, ulcers. No induration Neurologic: CN 2-12 grossly intact. Sensation intact, DTR normal. Strength 5/5 in all 4.  Psychiatric: Normal judgment and insight. Alert and oriented x 1. Normal mood.     Labs on Admission: I have personally reviewed following labs and imaging studies  CBC:  Recent Labs Lab 09/27/16 1804  WBC 16.1*  HGB 12.3*  HCT 36.6*  MCV 79.4  PLT 142*   Basic Metabolic Panel:  Recent Labs Lab 09/27/16 1804  NA 145  K 4.2  CL 116*  CO2 19*  GLUCOSE 132*  BUN 72*  CREATININE 3.88*  CALCIUM 8.9   GFR: Estimated Creatinine Clearance: 13.7 mL/min (A) (by C-G formula based on SCr of 3.88 mg/dL (H)). Liver Function Tests:  Recent Labs Lab 09/27/16 1804  AST 44*  ALT 11*  ALKPHOS 75  BILITOT 1.8*  PROT 7.0  ALBUMIN 3.5    Recent Labs Lab 09/27/16 1804  LIPASE  68*   No results for input(s): AMMONIA in the last 168 hours. Coagulation Profile: No results for input(s): INR, PROTIME in the last 168 hours. Cardiac Enzymes: No results for input(s): CKTOTAL, CKMB, CKMBINDEX, TROPONINI in the last 168 hours. BNP (last 3 results) No results for input(s): PROBNP in the last 8760 hours. HbA1C: No results for input(s): HGBA1C in the last 72 hours. CBG: No results for input(s): GLUCAP in the last 168 hours. Lipid Profile: No results for input(s): CHOL, HDL, LDLCALC, TRIG, CHOLHDL, LDLDIRECT in the last 72 hours. Thyroid Function Tests: No results for input(s): TSH, T4TOTAL, FREET4, T3FREE, THYROIDAB in the last 72 hours. Anemia Panel: No results for input(s): VITAMINB12, FOLATE, FERRITIN, TIBC, IRON, RETICCTPCT in the last 72 hours. Urine analysis:    Component Value Date/Time   COLORURINE YELLOW 09/11/2016 1124   APPEARANCEUR CLEAR 09/11/2016 1124   LABSPEC 1.015 09/11/2016 1124   PHURINE 5.5 09/11/2016 1124   GLUCOSEU NEGATIVE 09/11/2016 1124   HGBUR NEGATIVE 09/11/2016 1124   BILIRUBINUR NEGATIVE 09/11/2016 1124   KETONESUR  NEGATIVE 09/11/2016 1124   PROTEINUR 100 (A) 09/02/2013 1403   UROBILINOGEN 0.2 09/11/2016 1124   NITRITE NEGATIVE 09/11/2016 1124   LEUKOCYTESUR NEGATIVE 09/11/2016 1124   Sepsis Labs: No results found for this or any previous visit (from the past 240 hour(s)).   Radiological Exams on Admission: No results found.  EKG: Independently reviewed. Sinus tachycardia 107 bpm  Assessment/Plan Nausea, vomiting, and diarrhea: Acute. Patient showing signs of acute dehydration. Question the possibility of a viral gastroenteritis versus obstruction versus other. - Admit to a telemetry bed - Check acute abdominal series - Strict ins and outs - Follow-up C. difficile - Zofran prn N/V - add on TSH  Acute renal failure superimposed on chronic kidney disease stage III: Patient's baseline creatinine previously 1.9 - 2.25, but patient presents with a creatinine of 3.85 and BUN of 72 to suggest prerenal cause of symptoms. -  IVF NS at 75 ml/hr  - Hold nephrotoxic agents - Recheck BMP in a.m.   Dysphagia: Patient reportedly with a 30 pound weight loss in the last 1 month due to dysphagia.Question if symptoms secondary to worsening of patient's dementia. As per family reports it seems the patient forgets to eat or chew meals. Family questioning need of PEG. - Speech therapy evaluation   Leukocytosis: Acute. WBC 16.1. - Check urinalysis and follow-up acute abdominal series - Will consider need of empiric antibiotics  - Follow-up repeat CBC in a.m.   Transient hypotension: Resolved. Initial systolic blood pressures reported to be in the 60s, but patient responded to initial fluid resuscitation. - Continue to monitor  Essential hypertension - Hold losartan-hydrochlorothiazide  Anemia of chronic disease: Hemoglobin 12.3 on admission  - Hold ferrous sulfate 2/2 n/v  - Continue to monitor   Dementia, with behavior disturbance: Stable. - Continue Aricept and Namenda  Thrombocytopenia: Platelet  count 142 on admission, but no acute signs of bleeding noted. - Continue to Monitor   BPH: Bladder scan showed no significant signs of obstruction. - continue Doxazosin  Gout - Reduce allopurinol 100 mg daily due to kidney function  GERD - Continue Protonix   DVT prophylaxis: heparin   Code Status: Full  Family Communication: Discussed plan of care with patient and family members present at bedside  Disposition Plan: TBD  Consults called: None  Admission status: Inpatient  Clydie Braun MD [facility] Pager [phone]  If 7PM-7AM, please contact night-coverage [email or web address] Password [email or web address]  09/27/2016, 7:58 PM

## 2016-09-27 NOTE — Care Management Note (Signed)
Case Management Note  Patient Details  Name: Anthony [name] MRN: [id number] Date of Birth: 11-28-1935  Subjective/Objective:                  Diarrhea and vomiting  Action/Plan: Patient leaving the ED for x-ray. He gave verbal permission to speak with his daughter who is in the room. ED CM spoke with Anthony [name] one of the patient's daughters. She states she lives with the patient but her sister Anthony [name] at [phone] makes the patient's medical decisions. She reports the patient is able to ambulate without assistance. They have stairs in the home. He is able to walk up and down the stairs. States he has been more alert since he has been here in the ED. She assists him at home with ADL's. She is not sure whether they would like the patient to discharge home with home health or to a facility. States he was discharged home with home health in November 2017. CM provided Anthony [name] with a list of home health agencies. She is aware the CSW will assist with facility placement if the family chooses placement. CM to continue to follow for discharge needs.   Expected Discharge Date:   (unknown)               Expected Discharge Plan:  Home w Home Health Services  In-House Referral:     Discharge planning Services  CM Consult  Post Acute Care Choice:  Home Health Choice offered to:  Adult Children  DME Arranged:    DME Agency:     HH Arranged:    HH Agency:     Status of Service:  In process, will continue to follow  If discussed at Long Length of Stay Meetings, dates discussed:    Additional Comments:  [name], RN 09/27/2016, 8:27 PM

## 2016-09-27 NOTE — ED Notes (Signed)
ED Provider at bedside. 

## 2016-09-27 NOTE — ED Provider Notes (Signed)
[facility]-EMERGENCY DEPT Provider Note   CSN: [id number] Arrival date & time: 09/27/16  1731     History   Chief Complaint Chief Complaint  Patient presents with  . Hypotension  . Emesis  . Diarrhea  . Failure To Thrive   Triage note: Pt comes from home via EMS with complaints of nausea, vomiting, and diarrhea since Saturday.  Pt displaying hypotension at this time. BP in route 76/56. On 2 L [place] due to saturation of 89% in route.  Hx of dementia. Family reports he is at baseline. 30 lb weight loss reported over the last month. Last oral intake was this morning but came right back up. 300 cc given in route. CBG 166. HPI Anthony [name] is a 81 y.o. male.   Emesis   This is a new problem. Episode onset: 5 days. The problem occurs 5 to 10 times per day. The emesis has an appearance of stomach contents. Associated symptoms include diarrhea.  Diarrhea   Associated symptoms include vomiting.    Remainder of history, ROS, and physical exam limited due to patient's condition (dementia). Additional information was obtained from EMS and family.   Level V Caveat.  Per family, pt has had trouble swallowing for the past several weeks. Takes Ensure and ice cream. Has had 30lbs wt loss in over 1 month.   No recent abx, sick contacts.  Past Medical History:  Diagnosis Date  . ABNORMAL ELECTROCARDIOGRAM 05/03/2010  . Alzheimer disease   . DIABETES MELLITUS, TYPE II 03/15/2007  . GALLSTONES 03/15/2007  . GOUT 03/15/2007  . HYPERLIPIDEMIA 03/15/2007  . HYPERTENSION 03/15/2007  . NEPHROPATHY, DIABETIC 03/15/2007  . RENAL INSUFFICIENCY 04/30/2009  . SHOULDER PAIN, BILATERAL 04/29/2008  . UROLITHIASIS, HX OF 03/15/2007    Patient Active Problem List   Diagnosis Date Noted  . Dysphagia 05/22/2016  . Vitamin D deficiency 04/10/2016  . Leukocytosis 04/10/2016  . Acute renal failure (HCC) 03/09/2016  . Chronic renal disease, stage III 03/09/2016  . Hypocalcemia 03/09/2016  . Heart murmur  03/09/2016  . AKI (acute kidney injury) (HCC)   . Esophageal reflux   . BPH (benign prostatic hyperplasia)   . Lewy body dementia without behavioral disturbance   . Melena 02/20/2016  . Gastrointestinal hemorrhage with melena 02/20/2016  . Near syncope 02/20/2016  . GI bleed due to NSAIDs 02/20/2016  . Abnormal breath sounds 09/10/2015  . Moderate dementia without behavioral disturbance 12/04/2014  . Wellness examination 09/02/2014  . Hyperglycemia 05/08/2014  . Breath sounds, abnormal 10/16/2013  . Pain in joint, lower leg 09/16/2013  . Memory loss 09/02/2013  . Edema 09/03/2012  . Encounter for long-term (current) use of other medications 05/05/2011  . Screening for prostate cancer 05/05/2011  . Screening for malignant neoplasm 05/05/2011  . Special screening for malignant neoplasm of prostate 05/05/2011  . Anemia 10/28/2010  . Routine general medical examination at a health care facility 10/28/2010  . ABNORMAL ELECTROCARDIOGRAM 05/03/2010  . Thrombocytopenia (HCC) 04/30/2009  . Disorder resulting from impaired renal function 04/30/2009  . SHOULDER PAIN, BILATERAL 04/29/2008  . NEPHROPATHY, DIABETIC 03/15/2007  . Dyslipidemia 03/15/2007  . Gout 03/15/2007  . HTN (hypertension) 03/15/2007  . GALLSTONES 03/15/2007  . UROLITHIASIS, HX OF 03/15/2007    Past Surgical History:  Procedure Laterality Date  . NASAL SEPTUM SURGERY  1978       Home Medications    Prior to Admission medications   Medication Sig Start Date End Date Taking? Authorizing Provider  allopurinol (ZYLOPRIM) 300  MG tablet Take 1 tablet (300 mg total) by mouth daily. 05/30/16  Yes Romero Belling, MD  aspirin 81 MG tablet Take 1 tablet (81 mg total) by mouth daily. 07/03/12  Yes Romero Belling, MD  colestipol (COLESTID) 1 g tablet Take 5 g by mouth daily.   Yes Historical Provider, MD  donepezil (ARICEPT) 10 MG tablet Take 1 tablet (10 mg total) by mouth daily with breakfast. 05/30/16  Yes Romero Belling, MD    doxazosin (CARDURA) 4 MG tablet Take 1 tablet (4 mg total) by mouth daily. 09/11/16  Yes Romero Belling, MD  famotidine (PEPCID) 40 MG tablet Take 1 tablet (40 mg total) by mouth at bedtime. Patient taking differently: Take 40 mg by mouth daily.  02/24/16  Yes Vassie Loll, MD  Ferrous Sulfate 220 (44 Fe) MG/5ML LIQD Take 5 mLs by mouth 2 (two) times daily. 05/22/16  Yes Romero Belling, MD  losartan-hydrochlorothiazide (HYZAAR) 50-12.5 MG tablet 1/2 tab daily Patient taking differently: Take 0.5 tablets by mouth daily. 1/2 tab daily 05/22/16  Yes Romero Belling, MD  memantine (NAMENDA) 10 MG tablet Take 1 tablet (10 mg total) by mouth 2 (two) times daily. Patient taking differently: Take 10 mg by mouth daily.  05/22/16  Yes Romero Belling, MD  pantoprazole (PROTONIX) 40 MG tablet Take 1 tablet (40 mg total) by mouth 2 (two) times daily. Patient taking differently: Take 40 mg by mouth daily.  05/22/16  Yes Romero Belling, MD  acetaminophen (TYLENOL) 325 MG tablet Take 2 tablets (650 mg total) by mouth every 6 (six) hours as needed for mild pain, moderate pain or headache (or Fever >/= 101). Patient not taking: Reported on 09/27/2016 02/24/16   Vassie Loll, MD    Family History Family History  Problem Relation Age of Onset  . Cancer Father     Prostate Cancer  . Colon cancer Neg Hx   . Esophageal cancer Neg Hx   . Stomach cancer Neg Hx     Social History Social History  Substance Use Topics  . Smoking status: Never Smoker  . Smokeless tobacco: Never Used  . Alcohol use No     Allergies   Amlodipine-atorvastatin; Benazepril hcl; and Ezetimibe   Review of Systems Review of Systems  Unable to perform ROS: Dementia  Gastrointestinal: Positive for diarrhea and vomiting.     Physical Exam Updated Vital Signs BP 102/78 (BP Location: Right Arm)   Pulse 100   SpO2 90%   Physical Exam  Constitutional: He appears well-developed and well-nourished. No distress.  HENT:  Head: Normocephalic and  atraumatic.  Nose: Nose normal.  Eyes: Conjunctivae and EOM are normal. Pupils are equal, round, and reactive to light. Right eye exhibits no discharge. Left eye exhibits no discharge. No scleral icterus.  Neck: Normal range of motion. Neck supple.  Cardiovascular: Regular rhythm.  Tachycardia present.  Exam reveals no gallop and no friction rub.   No murmur heard. Pulmonary/Chest: Effort normal and breath sounds normal. No stridor. No respiratory distress. He has no rales.  Abdominal: Soft. He exhibits no distension. There is no tenderness.  Musculoskeletal: He exhibits no edema or tenderness.  Neurological: He is alert.  Moves all extremities   Skin: Skin is warm and dry. No rash noted. He is not diaphoretic. No erythema.  Psychiatric: He has a normal mood and affect.  Vitals reviewed.    ED Treatments / Results  Labs (all labs ordered are listed, but only abnormal results are displayed) Labs Reviewed  LIPASE, BLOOD - Abnormal; Notable for the following:       Result Value   Lipase 68 (*)    All other components within normal limits  COMPREHENSIVE METABOLIC PANEL - Abnormal; Notable for the following:    Chloride 116 (*)    CO2 19 (*)    Glucose, Bld 132 (*)    BUN 72 (*)    Creatinine, Ser 3.88 (*)    AST 44 (*)    ALT 11 (*)    Total Bilirubin 1.8 (*)    GFR calc non Af Amer 14 (*)    GFR calc Af Amer 16 (*)    All other components within normal limits  CBC - Abnormal; Notable for the following:    WBC 16.1 (*)    Hemoglobin 12.3 (*)    HCT 36.6 (*)    RDW 16.0 (*)    Platelets 142 (*)    All other components within normal limits    EKG  EKG Interpretation  Date/Time:  Wednesday September 27 2016 18:02:35 EDT Ventricular Rate:  107 PR Interval:    QRS Duration: 90 QT Interval:  351 QTC Calculation: 469 R Axis:   -139 Text Interpretation:  Sinus tachycardia Multiple ventricular premature complexes Inferior infarct, old Extensive anterior infarct, old Lateral  leads are also involved Artifact Otherwise no significant change Confirmed by [facility] MD, Anaiah Mcmannis ([id number]) on 09/27/2016 7:07:53 PM       Radiology No results found.  Procedures Procedures (including critical care time)  Medications Ordered in ED Medications  ondansetron (ZOFRAN) injection 4 mg (not administered)  sodium chloride 0.9 % bolus 1,000 mL (1,000 mLs Intravenous New Bag/Given 09/27/16 1830)     Initial Impression / Assessment and Plan / ED Course  I have reviewed the triage vital signs and the nursing notes.  Pertinent labs & imaging results that were available during my care of the patient were reviewed by me and considered in my medical decision making (see chart for details).     Presentation concerning for dehydration and failure to thrive. Symptoms likely secondary to viral process. Low suspicion for severe intra-abdominal infection is/inflammatory process such as appendicitis, diverticulitis, C. difficile. Labs confirming the concern with significant acute renal insufficiency likely secondary to dehydration. Hypotension improved with IV fluids. Admitted to hospitalist for continued workup and management.  Final Clinical Impressions(s) / ED Diagnoses   Final diagnoses:  Nausea vomiting and diarrhea  AKI (acute kidney injury) (HCC)  Other specified hypotension  Dehydration      Nira Conn, MD 09/28/16 [phone]

## 2016-09-28 DIAGNOSIS — N183 Chronic kidney disease, stage 3 unspecified: Secondary | ICD-10-CM | POA: Diagnosis present

## 2016-09-28 DIAGNOSIS — L8990 Pressure ulcer of unspecified site, unspecified stage: Secondary | ICD-10-CM | POA: Insufficient documentation

## 2016-09-28 DIAGNOSIS — R197 Diarrhea, unspecified: Secondary | ICD-10-CM

## 2016-09-28 DIAGNOSIS — E860 Dehydration: Secondary | ICD-10-CM

## 2016-09-28 DIAGNOSIS — R112 Nausea with vomiting, unspecified: Secondary | ICD-10-CM

## 2016-09-28 DIAGNOSIS — E43 Unspecified severe protein-calorie malnutrition: Secondary | ICD-10-CM | POA: Insufficient documentation

## 2016-09-28 DIAGNOSIS — N179 Acute kidney failure, unspecified: Principal | ICD-10-CM

## 2016-09-28 DIAGNOSIS — N189 Chronic kidney disease, unspecified: Secondary | ICD-10-CM

## 2016-09-28 DIAGNOSIS — D631 Anemia in chronic kidney disease: Secondary | ICD-10-CM

## 2016-09-28 DIAGNOSIS — I959 Hypotension, unspecified: Secondary | ICD-10-CM | POA: Diagnosis present

## 2016-09-28 DIAGNOSIS — L89229 Pressure ulcer of left hip, unspecified stage: Secondary | ICD-10-CM | POA: Insufficient documentation

## 2016-09-28 LAB — CBC
HEMATOCRIT: 33.7 % — AB (ref 39.0–52.0)
HEMOGLOBIN: 11.3 g/dL — AB (ref 13.0–17.0)
MCH: 26.5 pg (ref 26.0–34.0)
MCHC: 33.5 g/dL (ref 30.0–36.0)
MCV: 79.1 fL (ref 78.0–100.0)
Platelets: 140 10*3/uL — ABNORMAL LOW (ref 150–400)
RBC: 4.26 MIL/uL (ref 4.22–5.81)
RDW: 15.8 % — ABNORMAL HIGH (ref 11.5–15.5)
WBC: 9.5 10*3/uL (ref 4.0–10.5)

## 2016-09-28 LAB — BASIC METABOLIC PANEL
ANION GAP: 11 (ref 5–15)
BUN: 76 mg/dL — AB (ref 6–20)
CALCIUM: 8.5 mg/dL — AB (ref 8.9–10.3)
CO2: 21 mmol/L — ABNORMAL LOW (ref 22–32)
Chloride: 118 mmol/L — ABNORMAL HIGH (ref 101–111)
Creatinine, Ser: 3.47 mg/dL — ABNORMAL HIGH (ref 0.61–1.24)
GFR, EST AFRICAN AMERICAN: 18 mL/min — AB (ref >60)
GFR, EST NON AFRICAN AMERICAN: 16 mL/min — AB (ref >60)
GLUCOSE: 86 mg/dL (ref 65–99)
POTASSIUM: 3 mmol/L — AB (ref 3.5–5.1)
SODIUM: 150 mmol/L — AB (ref 135–145)

## 2016-09-28 LAB — C DIFFICILE QUICK SCREEN W PCR REFLEX
C Diff antigen: NEGATIVE
C Diff interpretation: NOT DETECTED
C Diff toxin: NEGATIVE

## 2016-09-28 MED ORDER — POTASSIUM CHLORIDE CRYS ER 20 MEQ PO TBCR
40.0000 meq | EXTENDED_RELEASE_TABLET | Freq: Two times a day (BID) | ORAL | Status: AC
  Administered 2016-09-28: 40 meq via ORAL
  Filled 2016-09-28: qty 2

## 2016-09-28 MED ORDER — FAMOTIDINE 20 MG PO TABS
20.0000 mg | ORAL_TABLET | Freq: Every day | ORAL | Status: DC
  Administered 2016-09-29 – 2016-10-01 (×3): 20 mg via ORAL
  Filled 2016-09-28 (×4): qty 1

## 2016-09-28 MED ORDER — CHLORHEXIDINE GLUCONATE 0.12 % MT SOLN
15.0000 mL | Freq: Two times a day (BID) | OROMUCOSAL | Status: DC
  Administered 2016-09-29 – 2016-10-01 (×3): 15 mL via OROMUCOSAL
  Filled 2016-09-28 (×4): qty 15

## 2016-09-28 MED ORDER — LORAZEPAM 2 MG/ML IJ SOLN
0.5000 mg | INTRAMUSCULAR | Status: DC | PRN
  Administered 2016-09-28 – 2016-10-01 (×3): 0.5 mg via INTRAVENOUS
  Filled 2016-09-28 (×4): qty 1

## 2016-09-28 MED ORDER — ORAL CARE MOUTH RINSE
15.0000 mL | Freq: Two times a day (BID) | OROMUCOSAL | Status: DC
  Administered 2016-09-28 – 2016-09-30 (×3): 15 mL via OROMUCOSAL

## 2016-09-28 NOTE — Evaluation (Signed)
Clinical/Bedside Swallow Evaluation Patient Details  Name: Anthony [name] MRN: [id number] Date of Birth: [birth year] BUSHARTDate: 09/28/2016 Time: SLP Start Time (ACUTE ONLY): 1445 SLP Stop Time (ACUTE ONLY): 1520 SLP Time Calculation (min) (ACUTE ONLY): 35 min  Past Medical History:  Past Medical History:  Diagnosis Date  . ABNORMAL ELECTROCARDIOGRAM 05/03/2010  . Alzheimer disease   . DIABETES MELLITUS, TYPE II 03/15/2007  . GALLSTONES 03/15/2007  . GOUT 03/15/2007  . HYPERLIPIDEMIA 03/15/2007  . HYPERTENSION 03/15/2007  . NEPHROPATHY, DIABETIC 03/15/2007  . RENAL INSUFFICIENCY 04/30/2009  . SHOULDER PAIN, BILATERAL 04/29/2008  . UROLITHIASIS, HX OF 03/15/2007   Past Surgical History:  Past Surgical History:  Procedure Laterality Date  . NASAL SEPTUM SURGERY  1978   HPI:  81 yo male adm to [facility] with N/V/D.  PMH + for dementia - home with caregivers, weight loss, dysphagia.  Pt reportedly holding foods in his mouth at home and not swallowing.  Daughter reports he has to be told to swallow at times.  Pt 09/27/16 DG abd/chest showed gas filled colon and bilateral ATX.  Pt has undergone prior esophagram in 05/2016 demonstration oral deficits but no pharyngeal/esophageal impairment.  Swallow eval ordered as daughter [name] states she wants to make sure pt does not have an "obstruction".     Assessment / Plan / Recommendation Clinical Impression  Pt presents with mild oral dysphagia due to his cognitive deficit resulting in oral holding/decreased awareness.  Suspect pharyngeal swallow is intact - no indications of residuals.  Pt with cough x1 of thin liquids- wincing with swallowing drinks- daughter states due to displeasure and not pain.  Small boluses tolerated well without airway compromise symptoms.  Educated [name] thoroughly to findings/recommendations including modifcation to textures/flavors to compensate for gustatory changes in writing, verbally.  [name] provided detailed  information to this SLP regarding food/drink items she provides to pt.   They are pureeing foods at home to ease pt's oral transiting.  Advised to maximize liquid nutrition due to increased efficiency of swallowing and using thickener prn.  Also advised to provide pt intake when/if he is accepting.  Suspect cognitive based dysphagia and decreased intake may be due to dementia.   SLP Visit Diagnosis: Dysphagia, oral phase (R13.11)    Aspiration Risk  Mild aspiration risk;Moderate aspiration risk    Diet Recommendation Thin liquid;Dysphagia 1 (Puree)   Liquid Administration via: Straw;Cup Medication Administration: Crushed with puree Supervision: Full supervision/cueing for compensatory strategies;Staff to assist with self feeding Compensations: Small sips/bites;Slow rate;Other (Comment) (use liquids to faciliate clearance, check for oral pocketing) Postural Changes: Remain upright for at least 30 minutes after po intake;Seated upright at 90 degrees    Other  Recommendations Oral Care Recommendations: Oral care before and after PO   Follow up Recommendations None;24 hour supervision/assistance      Frequency and Duration            Prognosis        Swallow Study   General Date of Onset: 09/28/16 HPI: 81 yo male adm to [facility] with N/V/D.  PMH + for dementia - home with caregivers, weight loss, dysphagia.  Pt reportedly holding foods in his mouth at home and not swallowing.  Daughter reports he has to be told to swallow at times.  Pt 09/27/16 DG abd/chest showed gas filled colon and bilateral ATX.  Pt has undergone prior esophagram in 05/2016 demonstration oral deficits but no pharyngeal/esophageal impairment.  Swallow eval ordered as daughter [name] states she wants  to make sure pt does not have an "obstruction".   Type of Study: Bedside Swallow Evaluation Previous Swallow Assessment: see HH, pt receives purees at home Diet Prior to this Study:  (clears) Temperature Spikes Noted:  No Respiratory Status: Room air History of Recent Intubation: No Behavior/Cognition: Alert (inconsistently follows commands) Oral Cavity Assessment: Dry Oral Care Completed by SLP: No Oral Cavity - Dentition: Adequate natural dentition Vision: Functional for self-feeding Self-Feeding Abilities:  (able to hold his own cup, didn't see pt with solids due to diet limitations) Patient Positioning: Upright in bed Baseline Vocal Quality: Normal Volitional Cough: Cognitively unable to elicit Volitional Swallow: Unable to elicit    Oral/Motor/Sensory Function Overall Oral Motor/Sensory Function:  (no focal cn deficits)   Ice Chips Ice chips: Impaired Presentation: Spoon Oral Phase Impairments: Poor awareness of bolus Oral Phase Functional Implications: Prolonged oral transit;Oral holding   Thin Liquid Thin Liquid: Impaired Presentation: Cup;Self Fed;Spoon;Straw Oral Phase Impairments: Poor awareness of bolus Oral Phase Functional Implications: Oral holding Pharyngeal  Phase Impairments: Cough - Immediate Other Comments: cough x1 of 5 boluses with large amounts, small boluses tolerated well    Nectar Thick Nectar Thick Liquid: Not tested   Honey Thick Honey Thick Liquid: Not tested   Puree Puree: Not tested   Solid   GO   Solid: Not tested       Donavan Burnet, MS [facility] SLP [phone]

## 2016-09-28 NOTE — Progress Notes (Signed)
PROGRESS NOTE    Anthony [name]  ZOX:[id number] DOB: January 04, 1936 DOA: 09/27/2016 PCP: Romero Belling, MD    Brief Narrative:  81 y.o. male with medical history significant of HTN, Alzheimer's dementia, DM type II, BPH, CKD stage III; who presents with complaints of nausea, vomiting, and diarrhea for the last 4 days. Patient has dementia and much of history is obtained from the patient's daughter. At baseline patient lives with his daughter at home who is his primary caregiver. Over the last month or so the patient has had at least a 30 pound weight loss. He has had decreased appetite and difficulty remembering to chew and swallow his food. Family has been trying to supplement food with Ensure shakes. Patient has had no recent sick contacts to her knowledge. He has been vomiting his food and medications. She reports anywhere from 4-5 episodes of diarrhea daily. Symptoms have waxed and waned in intensity. They had not tried anything to treat symptoms of nausea, vomiting, or diarrhea. Associated symptoms including incontinence for which she had to recently by the patient adult briefs in the last 1-2 weeks Denies any significant dysuria, blood in stool, abdominal pain, chest pain, cough, shortness of breath, or recent antibiotic. In route with EMS patient's systolic blood pressure was noted to be in the 60s for which he was given an initial bolus of fluids.  ED Course: Upon admission into the emergency department patient was noted to have a temperature of 96.8F, pulse 98-100, respirations 13-21, blood pressure as low as 94/73, and O2 saturations maintained on 2 L of [place] oxygen. Labs revealed WBC 16.1, hemoglobin 12.3, platelets 142, BUN 72, creatinine 3.88, AST 44, ALT 11, BUN 1.8, lipase 68, and all other values relatively within normal limits. While in the ED patient was given 1 L of normal saline IV fluids and 4 mg of Zofran. Bladder scan showed only approximately 100 ml of urine  present.    Assessment & Plan:   Principal Problem:   Nausea vomiting and diarrhea Active Problems:   Leukocytosis   Dysphagia   Pressure injury of skin   Acute renal failure superimposed on stage 3 chronic kidney disease (HCC)   Transient hypotension   Anemia due to chronic kidney disease   Protein-calorie malnutrition, severe  Nausea, vomiting, and diarrhea: Acute. Patient showing signs of acute dehydration. Question the possibility of a viral gastroenteritis  - Xray abd reviewed. No evidence of obstruction - Follow-up C. Difficile neg - Stool culture pending - Zofran prn N/V - TSH within normal limits  Acute renal failure superimposed on chronic kidney disease stage III: Patient's baseline creatinine previously 1.9 - 2.25, but patient presents with a creatinine of 3.85 and BUN of 72 to suggest prerenal cause of symptoms. -  Continue IVF NS at 100cc/hr  - Hold nephrotoxic agents - repeat bmet in AM  Dysphagia: Patient reportedly with a 30 pound weight loss in the last 1 month due to dysphagia. Suspect symptoms secondary to worsening of patient's dementia. As per family reports it seems the patient forgets to eat or chew meals. Family questioning need of PEG. - Speech therapy eval noted  Leukocytosis: Acute. WBC 16.1. - Check urinalysis unremarkable - Resolved  Transient hypotension: Resolved. Initial systolic blood pressures reported to be in the 60s, but patient responded to initial fluid resuscitation. - will continue hydration  Essential hypertension - Hold losartan-hydrochlorothiazide per above  Anemia of chronic disease: Hemoglobin 12.3 on admission  - Ferrous sulfate was held initially 2/2  n/v  - hgb stable at present  Dementia, with behavior disturbance: Stable. - Continue Aricept and Namenda - More agitated this evening. Will add PRN ativan  Thrombocytopenia: Platelet count 142 on admission, but no acute signs of bleeding noted. - Continue to  Monitor   BPH: Bladder scan showed no significant signs of obstruction. - continue Doxazosin - Stable  Gout - Reduce allopurinol 100 mg daily due to kidney function - Currently stable  GERD - Continue Protonix - Presently stable   DVT prophylaxis: Heparin subQ Code Status: Full Family Communication: Pt in room, family at bedside Disposition Plan: Uncertain at this time  Consultants:     Procedures:     Antimicrobials: Anti-infectives    None       Subjective: Without complaints  Objective: Vitals:   09/28/16 0643 09/28/16 0808 09/28/16 1046 09/28/16 1500  BP: 131/68   116/80  Pulse: 65   67  Resp: 18   18  Temp: 97.8 F (36.6 C)   98 F (36.7 C)  TempSrc: Axillary   Axillary  SpO2: 96% 96% 99% 94%    Intake/Output Summary (Last 24 hours) at 09/28/16 1832 Last data filed at 09/28/16 1416  Gross per 24 hour  Intake          1701.25 ml  Output              200 ml  Net          1501.25 ml   There were no vitals filed for this visit.  Examination:  General exam: Appears calm and comfortable  Respiratory system: Clear to auscultation. Respiratory effort normal. Cardiovascular system: S1 & S2 heard, RRR. No JVD, murmurs, rubs, gallops or clicks. No pedal edema. Gastrointestinal system: Abdomen is nondistended, soft and nontender. No organomegaly or masses felt. Normal bowel sounds heard. Central nervous system: Alert. No focal neurological deficits. Extremities: Symmetric 5 x 5 power. Skin: No rashes, lesions or ulcers Psychiatry: Judgement and insight appear normal. Mood & affect appropriate.   Data Reviewed: I have personally reviewed following labs and imaging studies  CBC:  Recent Labs Lab 09/27/16 1804 09/28/16 0647  WBC 16.1* 9.5  HGB 12.3* 11.3*  HCT 36.6* 33.7*  MCV 79.4 79.1  PLT 142* 140*   Basic Metabolic Panel:  Recent Labs Lab 09/27/16 1804 09/28/16 0647  NA 145 150*  K 4.2 3.0*  CL 116* 118*  CO2 19* 21*  GLUCOSE  132* 86  BUN 72* 76*  CREATININE 3.88* 3.47*  CALCIUM 8.9 8.5*   GFR: Estimated Creatinine Clearance: 15.3 mL/min (A) (by C-G formula based on SCr of 3.47 mg/dL (H)). Liver Function Tests:  Recent Labs Lab 09/27/16 1804  AST 44*  ALT 11*  ALKPHOS 75  BILITOT 1.8*  PROT 7.0  ALBUMIN 3.5    Recent Labs Lab 09/27/16 1804  LIPASE 68*   No results for input(s): AMMONIA in the last 168 hours. Coagulation Profile: No results for input(s): INR, PROTIME in the last 168 hours. Cardiac Enzymes: No results for input(s): CKTOTAL, CKMB, CKMBINDEX, TROPONINI in the last 168 hours. BNP (last 3 results) No results for input(s): PROBNP in the last 8760 hours. HbA1C: No results for input(s): HGBA1C in the last 72 hours. CBG: No results for input(s): GLUCAP in the last 168 hours. Lipid Profile: No results for input(s): CHOL, HDL, LDLCALC, TRIG, CHOLHDL, LDLDIRECT in the last 72 hours. Thyroid Function Tests:  Recent Labs  09/27/16 1813  TSH 0.913   Anemia  Panel: No results for input(s): VITAMINB12, FOLATE, FERRITIN, TIBC, IRON, RETICCTPCT in the last 72 hours. Sepsis Labs: No results for input(s): PROCALCITON, LATICACIDVEN in the last 168 hours.  Recent Results (from the past 240 hour(s))  C difficile quick scan w PCR reflex     Status: None   Collection Time: 09/28/16  3:57 AM  Result Value Ref Range Status   C Diff antigen NEGATIVE NEGATIVE Final   C Diff toxin NEGATIVE NEGATIVE Final   C Diff interpretation No C. difficile detected.  Final     Radiology Studies: Dg Abd Acute W/chest  Result Date: 09/27/2016 CLINICAL DATA:  81 y/o male with emesis and diarrhea x3-4 days. Pt's family notes decreased appetite as well. No hx abd issues. EXAM: DG ABDOMEN ACUTE W/ 1V CHEST COMPARISON:  04/11/2016 FINDINGS: Normal cardiac silhouette. Mild bibasilar atelectasis. No pneumothorax. No evidence of intraperitoneal free air on the decubitus view. Gas-filled loops of colon are prominent  but within normal limits. No dilated loops of small bowel. No pathologic calcifications. No organomegaly. IMPRESSION: 1. Bibasilar atelectasis. 2. No intraperitoneal free air identified.  No bowel obstruction. 3. Gas distended colon. Electronically Signed   By: Genevive Bi M.D.   On: 09/27/2016 20:44    Scheduled Meds: . allopurinol  100 mg Oral Daily  . aspirin EC  81 mg Oral Daily  . chlorhexidine  15 mL Mouth Rinse BID  . donepezil  10 mg Oral Q breakfast  . doxazosin  4 mg Oral Daily  . [START ON 09/29/2016] famotidine  20 mg Oral Daily  . heparin  5,000 Units Subcutaneous Q8H  . mouth rinse  15 mL Mouth Rinse q12n4p  . memantine  10 mg Oral Daily  . pantoprazole  40 mg Oral Daily  . potassium chloride  40 mEq Oral BID   Continuous Infusions: . sodium chloride 100 mL/hr at 09/28/16 1117     LOS: 1 day   Mariluz Crespo, Scheryl Marten, MD [facility] Pager [phone]  If 7PM-7AM, please contact night-coverage [email or web address] Password [facility] 09/28/2016, 6:32 PM

## 2016-09-28 NOTE — Progress Notes (Signed)
Initial Nutrition Assessment  DOCUMENTATION CODES:   Severe malnutrition in context of chronic illness  INTERVENTION:   RD will order appropriate supplements after SLP evaluation; pt likes Mighty Shakes, Ensure, and Magic Cups  NUTRITION DIAGNOSIS:   Malnutrition (severe) related to advanced dementia, chronic illness  as evidenced by 20 percent weight loss in 4 months and 7 percent wt loss in 2 weeks, moderate depletions of body fat in chest and upper arms, moderate to severe depletions of muscle over entire body.  GOAL:   Patient will meet greater than or equal to 90% of their needs  MONITOR:   PO intake, Supplement acceptance, Labs, Weight trends, Skin  REASON FOR ASSESSMENT:   Malnutrition Screening Tool, Consult Assessment of nutrition requirement/status  ASSESSMENT:   81 y.o. male with medical history significant of HTN, Alzheimer's dementia, DM type II, BPH, CKD stage III; who presents with complaints of nausea, vomiting, and diarrhea for the last 4 days.    Met with pt and pt's daughter in room today. Unable to communicate with pt related to dementia so history obtained from daughter. Pt has 24hr care provided by family members at home. Pt's daughter reports that pt has had a slow decline in appetite and mental status over the past 18 months. Pt has begun having difficulty eating; daughter reports that he will hold food in his mouth and then spit it back out. Pt is able to tolerate liquids, ice cream, and bananas. Daughter has been making smoothies from Ensure, peanut butter, protein powder, and ice cream for pt. Pt is drinking at least 3 Ensures per day at home. Pt currently on clear liquid diet awaiting SLP evaluation. Per chart, pt has lost 34lbs(20%) in 4 months and 10lbs(7%) in two weeks; this is severe. Daughter reports that pt weighed over 300lbs two years ago. RD suspects weight loss is likely related to advanced dementia. Family is interested in discussing PEG tube if  needed. RD will order appropriate supplements once SLP evaluation compete. Pt to possibly discharge to SNF when medically ready. Pt with hypokalemia; monitor and supplement per MD discretion.   Medications reviewed and include: allopurinol, aspirin, pepcid, heparin, protonix, KCl  Labs reviewed: Na 150(H), K 3.0(L), Cl 118(H), BUN 76(H), creat 3.47(H), Ca 8.5(L), lipase 68(H), AST 44(H), tbili 1.8(H)  Nutrition-Focused physical exam completed. Findings are moderate fat depletions in chest and arms, moderate to severe muscle depletions over entire body, and no edema.   Diet Order:  Diet clear liquid Room service appropriate? Yes; Fluid consistency: Thin  Skin:  Wound (see comment) (Unstageable wound coccyx)  Last BM:  4/12  Height:   Ht Readings from Last 1 Encounters:  09/25/16 '5\' 9"'  (1.753 m)    Weight:   Wt Readings from Last 1 Encounters:  09/25/16 136 lb (61.7 kg)    Ideal Body Weight:  72.7 kg  BMI:  There is no height or weight on file to calculate BMI.  Estimated Nutritional Needs:   Kcal:  2000-2200kcal/day   Protein:  93-105g/day  Fluid:  >2L/day   EDUCATION NEEDS:   No education needs identified at this time  Koleen Distance, RD, LDN Pager #[phone]

## 2016-09-28 NOTE — Progress Notes (Signed)
Discussed pt nutrition and swallowing with pt daughter. She expresses concern about his nutrition, reporting that he used to weigh about 300 lbs. She states at home currently he usually eats ice cream and likes bananas. She also purees soups and adds protein powder for him. Family tries to give him ensure 3 times a day. Daughter reports that she feels due to his Alzheimers, patient has forgotten how to swallow.

## 2016-09-29 LAB — CBC
HEMATOCRIT: 29.4 % — AB (ref 39.0–52.0)
HEMOGLOBIN: 9.8 g/dL — AB (ref 13.0–17.0)
MCH: 26.6 pg (ref 26.0–34.0)
MCHC: 33.3 g/dL (ref 30.0–36.0)
MCV: 79.7 fL (ref 78.0–100.0)
Platelets: 98 10*3/uL — ABNORMAL LOW (ref 150–400)
RBC: 3.69 MIL/uL — ABNORMAL LOW (ref 4.22–5.81)
RDW: 15.9 % — AB (ref 11.5–15.5)
WBC: 7.6 10*3/uL (ref 4.0–10.5)

## 2016-09-29 LAB — MAGNESIUM: Magnesium: 1.9 mg/dL (ref 1.7–2.4)

## 2016-09-29 LAB — BASIC METABOLIC PANEL
Anion gap: 5 (ref 5–15)
BUN: 50 mg/dL — ABNORMAL HIGH (ref 6–20)
CHLORIDE: 123 mmol/L — AB (ref 101–111)
CO2: 20 mmol/L — AB (ref 22–32)
CREATININE: 2.75 mg/dL — AB (ref 0.61–1.24)
Calcium: 8 mg/dL — ABNORMAL LOW (ref 8.9–10.3)
GFR calc non Af Amer: 21 mL/min — ABNORMAL LOW (ref >60)
GFR, EST AFRICAN AMERICAN: 24 mL/min — AB (ref >60)
Glucose, Bld: 80 mg/dL (ref 65–99)
Potassium: 3.3 mmol/L — ABNORMAL LOW (ref 3.5–5.1)
Sodium: 148 mmol/L — ABNORMAL HIGH (ref 135–145)

## 2016-09-29 MED ORDER — POTASSIUM CHLORIDE CRYS ER 20 MEQ PO TBCR
60.0000 meq | EXTENDED_RELEASE_TABLET | Freq: Once | ORAL | Status: AC
  Administered 2016-09-29: 60 meq via ORAL
  Filled 2016-09-29: qty 3

## 2016-09-29 MED ORDER — ENSURE ENLIVE PO LIQD
237.0000 mL | Freq: Three times a day (TID) | ORAL | Status: DC
  Administered 2016-09-29 – 2016-10-01 (×4): 237 mL via ORAL

## 2016-09-29 MED ORDER — BOOST / RESOURCE BREEZE PO LIQD
1.0000 | Freq: Three times a day (TID) | ORAL | Status: DC
  Administered 2016-09-29 – 2016-10-01 (×4): 1 via ORAL

## 2016-09-29 NOTE — Progress Notes (Signed)
PROGRESS NOTE    Anthony FRANKO  [name]:[id number] DOB: 1935-11-07 DOA: 09/27/2016 PCP: Romero Belling, MD    Brief Narrative:  81 y.o. male with medical history significant of HTN, Alzheimer's dementia, DM type II, BPH, CKD stage III; who presents with complaints of nausea, vomiting, and diarrhea for the last 4 days. Patient has dementia and much of history is obtained from the patient's daughter. At baseline patient lives with his daughter at home who is his primary caregiver. Over the last month or so the patient has had at least a 30 pound weight loss. He has had decreased appetite and difficulty remembering to chew and swallow his food. Family has been trying to supplement food with Ensure shakes. Patient has had no recent sick contacts to her knowledge. He has been vomiting his food and medications. She reports anywhere from 4-5 episodes of diarrhea daily. Symptoms have waxed and waned in intensity. They had not tried anything to treat symptoms of nausea, vomiting, or diarrhea. Associated symptoms including incontinence for which she had to recently by the patient adult briefs in the last 1-2 weeks Denies any significant dysuria, blood in stool, abdominal pain, chest pain, cough, shortness of breath, or recent antibiotic. In route with EMS patient's systolic blood pressure was noted to be in the 60s for which he was given an initial bolus of fluids.  ED Course: Upon admission into the emergency department patient was noted to have a temperature of 96.76F, pulse 98-100, respirations 13-21, blood pressure as low as 94/73, and O2 saturations maintained on 2 L of [place] oxygen. Labs revealed WBC 16.1, hemoglobin 12.3, platelets 142, BUN 72, creatinine 3.88, AST 44, ALT 11, BUN 1.8, lipase 68, and all other values relatively within normal limits. While in the ED patient was given 1 L of normal saline IV fluids and 4 mg of Zofran. Bladder scan showed only approximately 100 ml of urine present.   Assessment  & Plan:   Principal Problem:   Nausea vomiting and diarrhea Active Problems:   Leukocytosis   Dysphagia   Pressure injury of skin   Acute renal failure superimposed on stage 3 chronic kidney disease (HCC)   Transient hypotension   Anemia due to chronic kidney disease   Protein-calorie malnutrition, severe  Nausea, vomiting, and diarrhea: Acute. Patient showing signs of acute dehydration. Question the possibility of a viral gastroenteritis  - Xray abd reviewed. No evidence of obstruction - C. Difficile neg - Stool culture was ordered, however not enough stool to test - Zofran continue prn N/V - TSH within normal limits - Improving  Acute renal failure superimposed on chronic kidney disease stage III: Patient's baseline creatinine previously 1.9 - 2.25, but patient presents with a creatinine of 3.85 and BUN of 72 to suggest prerenal cause of symptoms. - Patient continued on IVF NS at 100cc/hr  - Holding nephrotoxic agents - recheck bmet in AM  Dysphagia: Patient reportedly with a 30 pound weight loss in the last 1 month due to dysphagia. Suspect symptoms secondary to worsening of patient's dementia. As per family reports it seems the patient forgets to eat or chew meals. Family questioning need of PEG. - Speech therapy eval noted - Continue to encourage PO as tolerated - Family agreeable to Palliative Care consultation. Will consult  Leukocytosis: Acute. WBC 16.1. - Check urinalysis unremarkable - Now resolved  Transient hypotension: Resolved. Initial systolic blood pressures reported to be in the 60s, but patient responded to initial fluid resuscitation. - Patient to  continue hydration as tolerated  Essential hypertension - Holding losartan-hydrochlorothiazide as per above  Anemia of chronic disease: Hemoglobin 12.3 on admission  - Ferrous sulfate was held initially 2/2 n/v  - hgb remains stable at present  Dementia, with behavior disturbance: Stable. - Continue on  Aricept and Namenda - Have added PRN ativan for agitation - Concerns of progressive decline given significant weight loss noted over the past several months - Palliative Care per above  Thrombocytopenia: Platelet count 142 on admission, but no acute signs of bleeding noted. - Will continue to Monitor   BPH: Bladder scan showed no significant signs of obstruction. - continue Doxazosin as tolerated - Stable  Gout - Had reduced allopurinol 100 mg daily due to kidney function - remains stable  GERD - Continue Protonix - Remains stable   DVT prophylaxis: Heparin subQ Code Status: Full Family Communication: Pt in room, family at bedside Disposition Plan: Uncertain at this time  Consultants:   Palliative Care  Procedures:     Antimicrobials: Anti-infectives    None      Subjective: Without complaints  Objective: Vitals:   09/28/16 1500 09/28/16 2145 09/29/16 0548 09/29/16 1440  BP: 116/80 (!) 103/92 123/62 132/80  Pulse: 67 89 86 67  Resp: Temp: 98 F (36.7 C) 99.1 F (37.3 C) 98.6 F (37 C) 98.4 F (36.9 C)  TempSrc: Axillary Oral Axillary Oral  SpO2: 94% 96% 98% 99%    Intake/Output Summary (Last 24 hours) at 09/29/16 1635 Last data filed at 09/29/16 0600  Gross per 24 hour  Intake          2399.17 ml  Output              550 ml  Net          1849.17 ml   There were no vitals filed for this visit.  Examination:  General exam: Awake, sitting in chair, in nad Respiratory system: normal resp effort, no audible wheezing Cardiovascular system: regular rate, s1-s2 Gastrointestinal system: soft, nondistended, pos BS Central nervous system: cn2-12 grossly intact, strength intact Extremities: perfused, no clubbing Skin: decreased skin turgor, no notable skin lesions seen Psychiatry: mood appears normal/ no visual hallucinations  Data Reviewed: I have personally reviewed following labs and imaging studies  CBC:  Recent Labs Lab  09/27/16 1804 09/28/16 0647 09/29/16 0617  WBC 16.1* 9.5 7.6  HGB 12.3* 11.3* 9.8*  HCT 36.6* 33.7* 29.4*  MCV 79.4 79.1 79.7  PLT 142* 140* 98*   Basic Metabolic Panel:  Recent Labs Lab 09/27/16 1804 09/28/16 0647 09/29/16 0617  NA 145 150* 148*  K 4.2 3.0* 3.3*  CL 116* 118* 123*  CO2 19* 21* 20*  GLUCOSE 132* 86 80  BUN 72* 76* 50*  CREATININE 3.88* 3.47* 2.75*  CALCIUM 8.9 8.5* 8.0*  MG  --   --  1.9   GFR: Estimated Creatinine Clearance: 19.3 mL/min (A) (by C-G formula based on SCr of 2.75 mg/dL (H)). Liver Function Tests:  Recent Labs Lab 09/27/16 1804  AST 44*  ALT 11*  ALKPHOS 75  BILITOT 1.8*  PROT 7.0  ALBUMIN 3.5    Recent Labs Lab 09/27/16 1804  LIPASE 68*   No results for input(s): AMMONIA in the last 168 hours. Coagulation Profile: No results for input(s): INR, PROTIME in the last 168 hours. Cardiac Enzymes: No results for input(s): CKTOTAL, CKMB, CKMBINDEX, TROPONINI in the last 168 hours. BNP (last 3 results) No results  for input(s): PROBNP in the last 8760 hours. HbA1C: No results for input(s): HGBA1C in the last 72 hours. CBG: No results for input(s): GLUCAP in the last 168 hours. Lipid Profile: No results for input(s): CHOL, HDL, LDLCALC, TRIG, CHOLHDL, LDLDIRECT in the last 72 hours. Thyroid Function Tests:  Recent Labs  09/27/16 1813  TSH 0.913   Anemia Panel: No results for input(s): VITAMINB12, FOLATE, FERRITIN, TIBC, IRON, RETICCTPCT in the last 72 hours. Sepsis Labs: No results for input(s): PROCALCITON, LATICACIDVEN in the last 168 hours.  Recent Results (from the past 240 hour(s))  C difficile quick scan w PCR reflex     Status: None   Collection Time: 09/28/16  3:57 AM  Result Value Ref Range Status   C Diff antigen NEGATIVE NEGATIVE Final   C Diff toxin NEGATIVE NEGATIVE Final   C Diff interpretation No C. difficile detected.  Final     Radiology Studies: Dg Abd Acute W/chest  Result Date:  09/27/2016 CLINICAL DATA:  81 y/o male with emesis and diarrhea x3-4 days. Pt's family notes decreased appetite as well. No hx abd issues. EXAM: DG ABDOMEN ACUTE W/ 1V CHEST COMPARISON:  04/11/2016 FINDINGS: Normal cardiac silhouette. Mild bibasilar atelectasis. No pneumothorax. No evidence of intraperitoneal free air on the decubitus view. Gas-filled loops of colon are prominent but within normal limits. No dilated loops of small bowel. No pathologic calcifications. No organomegaly. IMPRESSION: 1. Bibasilar atelectasis. 2. No intraperitoneal free air identified.  No bowel obstruction. 3. Gas distended colon. Electronically Signed   By: Genevive Bi M.D.   On: 09/27/2016 20:44    Scheduled Meds: . allopurinol  100 mg Oral Daily  . aspirin EC  81 mg Oral Daily  . chlorhexidine  15 mL Mouth Rinse BID  . donepezil  10 mg Oral Q breakfast  . doxazosin  4 mg Oral Daily  . famotidine  20 mg Oral Daily  . feeding supplement  1 Container Oral TID BM  . feeding supplement (ENSURE ENLIVE)  237 mL Oral TID BM  . heparin  5,000 Units Subcutaneous Q8H  . mouth rinse  15 mL Mouth Rinse q12n4p  . memantine  10 mg Oral Daily  . pantoprazole  40 mg Oral Daily   Continuous Infusions: . sodium chloride 100 mL/hr at 09/29/16 0819     LOS: 2 days   Jahrell Hamor, Scheryl Marten, MD [facility] Pager [phone]  If 7PM-7AM, please contact night-coverage [email or web address] Password [facility] 09/29/2016, 4:35 PM

## 2016-09-29 NOTE — Evaluation (Signed)
Physical Therapy Evaluation Patient Details Name: Anthony [name] MRN: [id number] DOB: September 18, 1935 Today's Date: 09/29/2016   History of Present Illness  81 yo male admitted with N/V/D, ARF, dysphagia. Hx of Alz, DM, CKD, HTN  Clinical Impression  On eval, pt required Min assist +2 (safety/lines) for mobility. He walked ~100 feet with a RW. [name] was present during session. Pt participated well despite having dementia. He presents with general weakness, decreased activity tolerance, and impaired gait and balance. Discussed d/c plan-daughter would like pt to d/c to SNF for continued rehab.     Follow Up Recommendations SNF    Equipment Recommendations   (TBD at next venue)    Recommendations for Other Services       Precautions / Restrictions Precautions Precautions: Fall Precaution Comments: IV line in foot Restrictions Weight Bearing Restrictions: No      Mobility  Bed Mobility Overal bed mobility: Needs Assistance Bed Mobility: Supine to Sit     Supine to sit: Min assist;HOB elevated     General bed mobility comments: Assist for trunk. Multimodal cues to complete task.   Transfers Overall transfer level: Needs assistance Equipment used: Rolling walker (2 wheeled) Transfers: Sit to/from Stand Sit to Stand: Min assist;From elevated surface         General transfer comment: Assist to rise, stabilize, control descent. Multimodal cues for safety, hand placement  Ambulation/Gait Ambulation/Gait assistance: Min assist;+2 safety/equipment Ambulation Distance (Feet): 100 Feet Assistive device: Rolling walker (2 wheeled) Gait Pattern/deviations: Step-through pattern;Decreased stride length     General Gait Details: Assist to stabilize pt and maneuver with RW (pt does not use one at baseline). Daughter assisted with managing IV pole.   Stairs            Wheelchair Mobility    Modified Rankin (Stroke Patients Only)       Balance Overall  balance assessment: Needs assistance           Standing balance-Leahy Scale: Poor Standing balance comment: requires external assist currently                             Pertinent Vitals/Pain Pain Assessment: No/denies pain    Home Living Family/patient expects to be discharged to:: Unsure Living Arrangements: Children Available Help at Discharge: Family Type of Home: House       Home Layout: Two level Home Equipment: None      Prior Function Level of Independence: Needs assistance   Gait / Transfers Assistance Needed: per daughter, pt was ambulatory without a device  ADL's / Homemaking Assistance Needed: daughters assisted with bathing and dressing        Hand Dominance        Extremity/Trunk Assessment   Upper Extremity Assessment Upper Extremity Assessment: Generalized weakness    Lower Extremity Assessment Lower Extremity Assessment: Generalized weakness    Cervical / Trunk Assessment Cervical / Trunk Assessment: Normal  Communication   Communication: No difficulties  Cognition Arousal/Alertness: Awake/alert Behavior During Therapy: WFL for tasks assessed/performed Overall Cognitive Status: History of cognitive impairments - at baseline                                        General Comments      Exercises     Assessment/Plan    PT Assessment Patient needs continued PT services  PT  Problem List Decreased strength;Decreased mobility;Decreased activity tolerance;Decreased balance;Decreased knowledge of use of DME;Decreased cognition;Decreased safety awareness       PT Treatment Interventions DME instruction;Gait training;Therapeutic activities;Therapeutic exercise;Patient/family education;Functional mobility training;Balance training    PT Goals (Current goals can be found in the Care Plan section)  Acute Rehab PT Goals Patient Stated Goal: per daughter, rehab to get stronger PT Goal Formulation: With  family Time For Goal Achievement: 10/13/16 Potential to Achieve Goals: Good    Frequency Min 3X/week   Barriers to discharge        Co-evaluation               End of Session Equipment Utilized During Treatment: Gait belt Activity Tolerance: Patient tolerated treatment well Patient left: in chair;with call bell/phone within reach;with family/visitor present Made NT aware that pt was sitting in recliner with daughter in room providing supervision. Instructed daughter to call for nursing assistance if pt tries to get up.  PT Visit Diagnosis: Muscle weakness (generalized) (M62.81);Difficulty in walking, not elsewhere classified (R26.2)    Time: 1884-1660 PT Time Calculation (min) (ACUTE ONLY): 26 min   Charges:   PT Evaluation $PT Eval Low Complexity: 1 Procedure PT Treatments $Gait Training: 8-22 mins   PT G Codes:          Rebeca Alert, MPT Pager: [phone]

## 2016-09-29 NOTE — Progress Notes (Signed)
Date: September 29, 2016 Spoke with the daughter that is the [facility]: Lambert Rhonda W for patient.  Asking about home care and how to prevent future hospitalizations/discussed that a palliative care meeting of goals of care may be in order.  Daughter is agreeable with this.  Dr. Deno Etienne notified. Marcelle Smiling, BSN, RN3, CCM: [phone]

## 2016-09-30 DIAGNOSIS — Z7189 Other specified counseling: Secondary | ICD-10-CM

## 2016-09-30 DIAGNOSIS — G309 Alzheimer's disease, unspecified: Secondary | ICD-10-CM

## 2016-09-30 DIAGNOSIS — Z515 Encounter for palliative care: Secondary | ICD-10-CM

## 2016-09-30 DIAGNOSIS — F0280 Dementia in other diseases classified elsewhere without behavioral disturbance: Secondary | ICD-10-CM

## 2016-09-30 LAB — CBC
HEMATOCRIT: 29.1 % — AB (ref 39.0–52.0)
HEMOGLOBIN: 9.7 g/dL — AB (ref 13.0–17.0)
MCH: 26.6 pg (ref 26.0–34.0)
MCHC: 33.3 g/dL (ref 30.0–36.0)
MCV: 79.7 fL (ref 78.0–100.0)
Platelets: 100 10*3/uL — ABNORMAL LOW (ref 150–400)
RBC: 3.65 MIL/uL — ABNORMAL LOW (ref 4.22–5.81)
RDW: 15.9 % — AB (ref 11.5–15.5)
WBC: 7.7 10*3/uL (ref 4.0–10.5)

## 2016-09-30 LAB — BASIC METABOLIC PANEL
Anion gap: 4 — ABNORMAL LOW (ref 5–15)
BUN: 32 mg/dL — AB (ref 6–20)
CALCIUM: 8.1 mg/dL — AB (ref 8.9–10.3)
CO2: 19 mmol/L — AB (ref 22–32)
Chloride: 126 mmol/L — ABNORMAL HIGH (ref 101–111)
Creatinine, Ser: 2.09 mg/dL — ABNORMAL HIGH (ref 0.61–1.24)
GFR calc Af Amer: 33 mL/min — ABNORMAL LOW (ref >60)
GFR, EST NON AFRICAN AMERICAN: 29 mL/min — AB (ref >60)
GLUCOSE: 79 mg/dL (ref 65–99)
Potassium: 3.7 mmol/L (ref 3.5–5.1)
Sodium: 149 mmol/L — ABNORMAL HIGH (ref 135–145)

## 2016-09-30 NOTE — NC FL2 (Signed)
[place] MEDICAID FL2 LEVEL OF CARE SCREENING TOOL     IDENTIFICATION  Patient Name: Anthony [name] Birthdate: 1935/10/14 Sex: male Admission Date (Current Location): 09/27/2016  [facility] and [place] Number:  Producer, television/film/video and Address:  [facility],  [address] [place], [place] [address]      Provider Number: [id number]  Attending Physician Name and Address:  Jerald Kief, MD  Relative Name and Phone Number:       Current Level of Care: Hospital Recommended Level of Care: Skilled Nursing Facility Prior Approval Number:    Date Approved/Denied:   PASRR Number: [id number]  Discharge Plan: SNF    Current Diagnoses: Patient Active Problem List   Diagnosis Date Noted  . Pressure injury of skin 09/28/2016  . Acute renal failure superimposed on stage 3 chronic kidney disease (HCC) 09/28/2016  . Transient hypotension 09/28/2016  . Anemia due to chronic kidney disease 09/28/2016  . Protein-calorie malnutrition, severe 09/28/2016  . Nausea vomiting and diarrhea 09/27/2016  . Dysphagia 05/22/2016  . Vitamin D deficiency 04/10/2016  . Leukocytosis 04/10/2016  . Acute renal failure (HCC) 03/09/2016  . Chronic renal disease, stage III 03/09/2016  . Hypocalcemia 03/09/2016  . Heart murmur 03/09/2016  . AKI (acute kidney injury) (HCC)   . Esophageal reflux   . BPH (benign prostatic hyperplasia)   . Lewy body dementia without behavioral disturbance   . Melena 02/20/2016  . Gastrointestinal hemorrhage with melena 02/20/2016  . Near syncope 02/20/2016  . GI bleed due to NSAIDs 02/20/2016  . Abnormal breath sounds 09/10/2015  . Moderate dementia without behavioral disturbance 12/04/2014  . Wellness examination 09/02/2014  . Hyperglycemia 05/08/2014  . Breath sounds, abnormal 10/16/2013  . Pain in joint, lower leg 09/16/2013  . Memory loss 09/02/2013  . Edema 09/03/2012  . Encounter for long-term (current) use of other medications 05/05/2011  .  Screening for prostate cancer 05/05/2011  . Screening for malignant neoplasm 05/05/2011  . Special screening for malignant neoplasm of prostate 05/05/2011  . Anemia 10/28/2010  . Routine general medical examination at a health care facility 10/28/2010  . ABNORMAL ELECTROCARDIOGRAM 05/03/2010  . Thrombocytopenia (HCC) 04/30/2009  . Disorder resulting from impaired renal function 04/30/2009  . SHOULDER PAIN, BILATERAL 04/29/2008  . NEPHROPATHY, DIABETIC 03/15/2007  . Dyslipidemia 03/15/2007  . Gout 03/15/2007  . HTN (hypertension) 03/15/2007  . GALLSTONES 03/15/2007  . UROLITHIASIS, HX OF 03/15/2007    Orientation RESPIRATION BLADDER Height & Weight     Self  Normal Incontinent Weight: 138 lb (62.6 kg) Height:     BEHAVIORAL SYMPTOMS/MOOD NEUROLOGICAL BOWEL NUTRITION STATUS      Incontinent Diet (Thin liquids)  AMBULATORY STATUS COMMUNICATION OF NEEDS Skin   Extensive Assist Verbally (Difficult to undertand)  (Pressure ulcer unstageable- Coccyx- full tissue thickness loss)                       Personal Care Assistance Level of Assistance  Total care (PO intake poor- ST is following) Bathing Assistance: Maximum assistance     Total Care Assistance: Maximum assistance   Functional Limitations Info  Speech     Speech Info: Impaired    SPECIAL CARE FACTORS FREQUENCY  PT (By licensed PT), OT (By licensed OT), Speech therapy     PT Frequency: 5 OT Frequency: 5     Speech Therapy Frequency: 3      Contractures Contractures Info: Not present    Additional Factors Info  Code  Status, Allergies Code Status Info: Full Code Allergies Info: Amlodipine, Benazepril HCL, Eletimibe           Current Medications (09/30/2016):  This is the current hospital active medication list Current Facility-Administered Medications  Medication Dose Route Frequency Provider Last Rate Last Dose  . 0.9 %  sodium chloride infusion   Intravenous Continuous Jerald Kief, MD 100 mL/hr  at 09/29/16 1857    . acetaminophen (TYLENOL) tablet 650 mg  650 mg Oral Q6H PRN Clydie Braun, MD       Or  . acetaminophen (TYLENOL) suppository 650 mg  650 mg Rectal Q6H PRN Clydie Braun, MD      . albuterol (PROVENTIL) (2.5 MG/3ML) 0.083% nebulizer solution 2.5 mg  2.5 mg Nebulization Q2H PRN Clydie Braun, MD      . allopurinol (ZYLOPRIM) tablet 100 mg  100 mg Oral Daily Clydie Braun, MD   100 mg at 09/30/16 1028  . aspirin EC tablet 81 mg  81 mg Oral Daily Rondell Burtis Junes, MD   81 mg at 09/30/16 1028  . chlorhexidine (PERIDEX) 0.12 % solution 15 mL  15 mL Mouth Rinse BID Jerald Kief, MD   15 mL at 09/30/16 1028  . donepezil (ARICEPT) tablet 10 mg  10 mg Oral Q breakfast Clydie Braun, MD   10 mg at 09/30/16 1028  . doxazosin (CARDURA) tablet 4 mg  4 mg Oral Daily Clydie Braun, MD   4 mg at 09/30/16 1028  . famotidine (PEPCID) tablet 20 mg  20 mg Oral Daily Jerald Kief, MD   20 mg at 09/30/16 1028  . feeding supplement (BOOST / RESOURCE BREEZE) liquid 1 Container  1 Container Oral TID BM Jerald Kief, MD   1 Container at 09/29/16 1438  . feeding supplement (ENSURE ENLIVE) (ENSURE ENLIVE) liquid 237 mL  237 mL Oral TID BM Jerald Kief, MD   237 mL at 09/29/16 1438  . heparin injection 5,000 Units  5,000 Units Subcutaneous Q8H Rondell Burtis Junes, MD   5,000 Units at 09/30/16 0600  . LORazepam (ATIVAN) injection 0.5 mg  0.5 mg Intravenous Q4H PRN Jerald Kief, MD   0.5 mg at 09/29/16 2137  . MEDLINE mouth rinse  15 mL Mouth Rinse q12n4p Jerald Kief, MD   15 mL at 09/28/16 1754  . memantine (NAMENDA) tablet 10 mg  10 mg Oral Daily Clydie Braun, MD   10 mg at 09/30/16 1028  . ondansetron (ZOFRAN) injection 4 mg  4 mg Intravenous Once PRN Nira Conn, MD      . ondansetron [facility]) tablet 4 mg  4 mg Oral Q6H PRN Clydie Braun, MD       Or  . ondansetron (ZOFRAN) injection 4 mg  4 mg Intravenous Q6H PRN Clydie Braun, MD      . pantoprazole (PROTONIX) EC  tablet 40 mg  40 mg Oral Daily Clydie Braun, MD   40 mg at 09/30/16 1028     Discharge Medications: Please see discharge summary for a list of discharge medications.  Relevant Imaging Results:  Relevant Lab Results:   Additional Information SSN:  [id number]   Will need Pall[facility] Care [facility] at [facility]. Seeking short term rehab- then home with Home Health  Darylene Price, [place]

## 2016-09-30 NOTE — Clinical Social Work Note (Signed)
Ok per MD for d/c tomorrow to SNF. Per [facility] Admissions- bed will be available for patient. Notified daughter  Marina Gravel who is very pleased with this and will contact admissions to determine what time to come and sign admit papers tomorrow.  Weekend CSW will completed d/c tomorrow once d/c is confirmed.  Facility to arrange for Palliative Care follow up at the facility.  Lorri Frederick. Jaci Lazier, [place] [phone] (weekend coverage)

## 2016-09-30 NOTE — Clinical Social Work Note (Signed)
Palliative care MD spoke with daughter via phone and discussed with this SW. Agrees with short term SNF plan and then home with either Palliative Care or Hospice depending on condition. Patient still on liquid diet- will monitor for recommendations re: stability per MD.  Lorri Frederick. Jaci Lazier, [place] [phone]

## 2016-09-30 NOTE — Consult Note (Signed)
Consultation Note Date: 09/30/2016   Patient Name: Anthony [name]  DOB: 1936-02-04  MRN: [id number]  Age / Sex: 81 y.o., male  PCP: Renato Shin, MD Referring Physician: Donne Hazel, MD  Reason for Consultation: Establishing goals of care  HPI/Patient Profile: 81 y.o. male  with past medical history of HTN, Alzheimer's dementia, DM type II, BPH, CKD stage III admitted on 09/27/2016 with Nausea, vomiting, diarrhea, acute renal failure, dysphagia, significant weight loss.  Palliative consulted for goals of care.   Clinical Assessment and Goals of Care: I met today with Mr. Baus and his daughter, Everlene Farrier.  I also  Discussed with his daughter/HCPOA via phone.   We discussed clinical course as well as wishes moving forward in regard to advanced directives.  Concepts specific to code status and rehospitalization discussed.  Values and goals of care important to patient and family were attempted to be elicited.    We discussed difference between a aggressive medical intervention path and a palliative, comfort focused care path.    Concept of Hospice and Palliative Care were discussed  We discussed nutrition in advanced demential as well as benefit/burden of PEG tube in this population.  Questions and concerns addressed.   PMT will continue to support holistically.  His children and I discussed that the hospital can be useful as long as he is getting well enough from care he receives at the hospital to enjoy his time at home, but there is going to come a time in the near future where, if his goal is to be at home, he may be better served to plan on being at home and bringing care to him at home rather repeated trips to the hospital. We discussed hospice as a tool that may be beneficial in this goal when he reaches a point where we are trying to fix problems that are not fixable.  His daughter is in  agreement that a good plan would be to transition to SNF for trial of rehab as they have previously been arranging. He has done well with rehabbing in the past. If he does well and continues to thrive, I encouraged they continue with this plan. If, however he is unable to regain function and he continues to decline, I recommended that she speak with his PCP to determine if he may be better served by focusing his care on staying at home with support of organization such as hospice.  SUMMARY OF RECOMMENDATIONS   - To SNF for rehab.  Palliative care to follow at SNF. - Family is in phase of collecting information to help create plan moving forward for his care.  I provided a copy of [facility] [place] for their review.  Code Status/Advance Care Planning:  Full code  Palliative Prophylaxis:   Aspiration, Delirium Protocol and Frequent Pain Assessment  Psycho-social/Spiritual:   Desire for further Chaplaincy support:no  Additional Recommendations: Education on Hospice and palliative to follow at SNF  Prognosis:   Unable to determine  Discharge Planning: Skilled Nursing  Facility for rehab with Palliative care service follow-up      Primary Diagnoses: Present on Admission: . Nausea vomiting and diarrhea . Pressure injury of skin . Acute renal failure superimposed on stage 3 chronic kidney disease ([place]) . Leukocytosis . Transient hypotension . Anemia due to chronic kidney disease   I have reviewed the medical record, interviewed the patient and family, and examined the patient. The following aspects are pertinent.  Past Medical History:  Diagnosis Date  . ABNORMAL ELECTROCARDIOGRAM 05/03/2010  . Alzheimer disease   . DIABETES MELLITUS, TYPE II 03/15/2007  . GALLSTONES 03/15/2007  . GOUT 03/15/2007  . HYPERLIPIDEMIA 03/15/2007  . HYPERTENSION 03/15/2007  . NEPHROPATHY, DIABETIC 03/15/2007  . RENAL INSUFFICIENCY 04/30/2009  . SHOULDER PAIN, BILATERAL 04/29/2008  .  UROLITHIASIS, HX OF 03/15/2007   Social History   Social History  . Marital status: Divorced    Spouse name: N/A  . Number of children: N/A  . Years of education: N/A   Occupational History  . Retired    Social History Main Topics  . Smoking status: Never Smoker  . Smokeless tobacco: Never Used  . Alcohol use No  . Drug use: No  . Sexual activity: No   Other Topics Concern  . 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 Cancer Father     Prostate Cancer  . Colon cancer Neg Hx   . Esophageal cancer Neg Hx   . Stomach cancer Neg Hx    Scheduled Meds: . allopurinol  100 mg Oral Daily  . aspirin EC  81 mg Oral Daily  . chlorhexidine  15 mL Mouth Rinse BID  . donepezil  10 mg Oral Q breakfast  . doxazosin  4 mg Oral Daily  . famotidine  20 mg Oral Daily  . feeding supplement  1 Container Oral TID BM  . feeding supplement (ENSURE ENLIVE)  237 mL Oral TID BM  . heparin  5,000 Units Subcutaneous Q8H  . mouth rinse  15 mL Mouth Rinse q12n4p  . memantine  10 mg Oral Daily  . pantoprazole  40 mg Oral Daily   Continuous Infusions: . sodium chloride 100 mL/hr at 09/30/16 1505   PRN Meds:.acetaminophen **OR** acetaminophen, albuterol, LORazepam, ondansetron (ZOFRAN) IV, ondansetron **OR** ondansetron (ZOFRAN) IV Medications Prior to Admission:  Prior to Admission medications   Medication Sig Start Date End Date Taking? Authorizing Provider  allopurinol (ZYLOPRIM) 300 MG tablet Take 1 tablet (300 mg total) by mouth daily. 05/30/16  Yes Renato Shin, MD  aspirin 81 MG tablet Take 1 tablet (81 mg total) by mouth daily. 07/03/12  Yes Renato Shin, MD  colestipol (COLESTID) 1 g tablet Take 5 g by mouth daily.   Yes Historical Provider, MD  donepezil (ARICEPT) 10 MG tablet Take 1 tablet (10 mg total) by mouth daily with breakfast. 05/30/16  Yes Renato Shin, MD  doxazosin (CARDURA) 4 MG tablet Take 1  tablet (4 mg total) by mouth daily. 09/11/16  Yes Renato Shin, MD  famotidine (PEPCID) 40 MG tablet Take 1 tablet (40 mg total) by mouth at bedtime. Patient taking differently: Take 40 mg by mouth daily.  02/24/16  Yes Barton Dubois, MD  Ferrous Sulfate 220 (44 Fe) MG/5ML LIQD Take 5 mLs by mouth 2 (two) times daily. 05/22/16  Yes Renato Shin, MD  losartan-hydrochlorothiazide (HYZAAR) 50-12.5 MG tablet 1/2  tab daily Patient taking differently: Take 0.5 tablets by mouth daily. 1/2 tab daily 05/22/16  Yes Renato Shin, MD  memantine (NAMENDA) 10 MG tablet Take 1 tablet (10 mg total) by mouth 2 (two) times daily. Patient taking differently: Take 10 mg by mouth daily.  05/22/16  Yes Renato Shin, MD  pantoprazole (PROTONIX) 40 MG tablet Take 1 tablet (40 mg total) by mouth 2 (two) times daily. Patient taking differently: Take 40 mg by mouth daily.  05/22/16  Yes Renato Shin, MD  acetaminophen (TYLENOL) 325 MG tablet Take 2 tablets (650 mg total) by mouth every 6 (six) hours as needed for mild pain, moderate pain or headache (or Fever >/= 101). Patient not taking: Reported on 09/27/2016 02/24/16   Barton Dubois, MD   Allergies  Allergen Reactions  . Amlodipine-Atorvastatin Other (See Comments)    REACTION: LEG CRAMPS  . Benazepril Hcl Other (See Comments)    REACTION: Cough  . Ezetimibe Other (See Comments)    REACTION: edema   Review of Systems  Unreliable historian  Physical Exam General: Alert, awake, in no acute distress.  HEENT: No bruits, no goiter, no JVD Heart: Regular rate and rhythm. No murmur appreciated. Lungs: Good air movement, clear Abdomen: Soft, nontender, nondistended, positive bowel sounds.  Ext: No significant edema Skin: Warm and dry Neuro: Grossly intact, nonfocal.  Vital Signs: BP (!) 147/82 (BP Location: Left Arm)   Pulse 69   Temp 97.8 F (36.6 C) (Axillary)   Resp 18   Wt 62 kg (136 lb 11 oz)   SpO2 98%   BMI 20.18 kg/m  Pain Assessment: 0-10   Pain  Score: 0-No pain   SpO2: SpO2: 98 % O2 Device:SpO2: 98 % O2 Flow Rate: .O2 Flow Rate (L/min): 2 L/min  IO: Intake/output summary:  Intake/Output Summary (Last 24 hours) at 09/30/16 1647 Last data filed at 09/30/16 0915  Gross per 24 hour  Intake             1300 ml  Output              700 ml  Net              600 ml    LBM: Last BM Date: 09/29/16 Baseline Weight: Weight: 62.6 kg (138 lb) Most recent weight: Weight: 62 kg (136 lb 11 oz)     Palliative Assessment/Data:   Flowsheet Rows     Most Recent Value  Intake Tab  Referral Department  Hospitalist  Unit at Time of Referral  Med/Surg Unit  Palliative Care Primary Diagnosis  Neurology  Date Notified  09/29/16  Palliative Care Type  New Palliative care  Date of Admission  09/27/16  Date first seen by Palliative Care  09/30/16  # of days Palliative referral response time  1 Day(s)  # of days IP prior to Palliative referral  2  Clinical Assessment  Palliative Performance Scale Score  30%  Pain Max last 24 hours  Not able to report  Pain Min Last 24 hours  Not able to report  Psychosocial & Spiritual Assessment  Palliative Care Outcomes  Patient/Family meeting held?  Yes  Who was at the meeting?  Daughters  Palliative Care Outcomes  Counseled regarding hospice, Provided advance care planning      Time In: 0930  Time Out: 1030  Time Total: 60 Greater than 50%  of this time was spent counseling and coordinating care related to the above assessment and plan.  Signed  by: Micheline Rough, MD   Please contact Palliative Medicine Team phone at [phone] for questions and concerns.  For individual provider: See Shea Evans

## 2016-09-30 NOTE — Clinical Social Work Note (Signed)
CSW spoke with patient's daughter and POA- Angelia Zawadzki today. Discussed possible d/c options including SNF and home health services.  PT recommends SNF.  Daughter is agreeable to this and prefers [facility] if possible. Discussed bedsearch process - she stated that she has been given a SNF list.  CSW will initiate Fl2 and send via the HUB to [facility] SNFs for review.  Daughter also is requesting a Palliative Care consult. MD is aware and has requested this.  Active bed search will be initiated. Anticipate d/c plan:  SNF with Palliative Care follow up at facility.  Lorri Frederick. Jaci Lazier, [place] [phone]  (weekend coverage)

## 2016-09-30 NOTE — Progress Notes (Signed)
PROGRESS NOTE    Anthony [name]  ZOX:[id number] DOB: 02-13-1936 DOA: 09/27/2016 PCP: Romero Belling, MD    Brief Narrative:  81 y.o. male with medical history significant of HTN, Alzheimer's dementia, DM type II, BPH, CKD stage III; who presents with complaints of nausea, vomiting, and diarrhea for the last 4 days. Patient has dementia and much of history is obtained from the patient's daughter. At baseline patient lives with his daughter at home who is his primary caregiver. Over the last month or so the patient has had at least a 30 pound weight loss. He has had decreased appetite and difficulty remembering to chew and swallow his food. Family has been trying to supplement food with Ensure shakes. Patient has had no recent sick contacts to her knowledge. He has been vomiting his food and medications. She reports anywhere from 4-5 episodes of diarrhea daily. Symptoms have waxed and waned in intensity. They had not tried anything to treat symptoms of nausea, vomiting, or diarrhea. Associated symptoms including incontinence for which she had to recently by the patient adult briefs in the last 1-2 weeks Denies any significant dysuria, blood in stool, abdominal pain, chest pain, cough, shortness of breath, or recent antibiotic. In route with EMS patient's systolic blood pressure was noted to be in the 60s for which he was given an initial bolus of fluids.  ED Course: Upon admission into the emergency department patient was noted to have a temperature of 96.63F, pulse 98-100, respirations 13-21, blood pressure as low as 94/73, and O2 saturations maintained on 2 L of [place] oxygen. Labs revealed WBC 16.1, hemoglobin 12.3, platelets 142, BUN 72, creatinine 3.88, AST 44, ALT 11, BUN 1.8, lipase 68, and all other values relatively within normal limits. While in the ED patient was given 1 L of normal saline IV fluids and 4 mg of Zofran. Bladder scan showed only approximately 100 ml of urine present.   Assessment  & Plan:   Principal Problem:   Nausea vomiting and diarrhea Active Problems:   Leukocytosis   Dysphagia   Pressure injury of skin   Acute renal failure superimposed on stage 3 chronic kidney disease ([facility])   Transient hypotension   Anemia due to chronic kidney disease   Protein-calorie malnutrition, severe  Nausea, vomiting, and diarrhea: Acute. Patient showing signs of acute dehydration. Question the possibility of a viral gastroenteritis  - Xray abd reviewed. No evidence of obstruction - C. Difficile neg - Stool culture was ordered, however patient has not produced enough stool to test - Zofran is continue prn N/V - TSH within normal limits - Much improved  Acute renal failure superimposed on chronic kidney disease stage III: Patient's baseline creatinine previously 1.9 - 2.25, but patient presents with a creatinine of 3.85 and BUN of 72 to suggest prerenal cause of symptoms. - Patient continued on IVF NS at 100cc/hr  - Continue to hold nephrotoxic agents -We'll repeat basic metabolic panel morning  Dysphagia: Patient reportedly with a 30 pound weight loss in the last 1 month due to dysphagia. Suspect symptoms secondary to worsening of patient's dementia. As per family reports it seems the patient forgets to eat or chew meals. Family questioning need of PEG. - Speech therapy conditions are noted - Continue to encourage PO as tolerated - Stable at present  Leukocytosis: Acute. WBC 16.1. - Check urinalysis unremarkable - Currently resolved  Transient hypotension: Resolved. Initial systolic blood pressures reported to be in the 60s, but patient responded to initial fluid  resuscitation. - Improved with hydration. We'll continue for now  Essential hypertension - losartan-hydrochlorothiazide remains on hold as per above  Anemia of chronic disease: Hemoglobin 12.3 on admission  - Ferrous sulfate was held initially 2/2 n/v  - hgb currently stable  Dementia, with behavior  disturbance: Stable. - Continue on Aricept and Namenda - Have added PRN ativan for agitation - Concerns of progressive decline given significant weight loss noted over the past several months - Really appreciate input by palliative care. Recommendations are noted. Patient is planned for transfer to skilled nursing facility with palliative care to follow at the facility.  Thrombocytopenia: Platelet count 142 on admission, but no acute signs of bleeding noted. - Will continue to Monitor   BPH: Bladder scan showed no significant signs of obstruction. - continue Doxazosin as tolerated - Stable  Gout - Had reduced allopurinol 100 mg daily due to kidney function - Currently stable  GERD - Continue Protonix - Presently stable   DVT prophylaxis: Heparin subQ Code Status: Full Family Communication: Pt in room, family at bedside Disposition Plan: Uncertain at this time  Consultants:   Palliative Care  Procedures:     Antimicrobials: Anti-infectives    None      Subjective: Without complaints  Objective: Vitals:   09/29/16 1440 09/29/16 2124 09/30/16 0542 09/30/16 1445  BP: 132/80 119/89 (!) 126/97 (!) 147/82  Pulse: 67 (!) 53 68 69  Resp: Temp: 98.4 F (36.9 C) 98.3 F (36.8 C) 98.4 F (36.9 C) 97.8 F (36.6 C)  TempSrc: Oral Axillary Axillary Axillary  SpO2: 99%  97% 98%  Weight:   62.6 kg (138 lb) 62 kg (136 lb 11 oz)    Intake/Output Summary (Last 24 hours) at 09/30/16 1900 Last data filed at 09/30/16 0915  Gross per 24 hour  Intake             1300 ml  Output              300 ml  Net             1000 ml   Filed Weights   09/30/16 0542 09/30/16 1445  Weight: 62.6 kg (138 lb) 62 kg (136 lb 11 oz)    Examination:  General exam: Lying in bed, awake, conversant, no acute distress Respiratory system: Normal chest rise, no wheezing heard Cardiovascular system: Regular rhythm, S1, S2 Gastrointestinal system: Positive bowel sounds, soft,  nondistended Central nervous system: No seizures, no tremors Extremities: No cyanosis, no joint deformities Skin: No rashes, no pallor Psychiatry: Affect appears normal/ no auditory hallucinations  Data Reviewed: I have personally reviewed following labs and imaging studies  CBC:  Recent Labs Lab 09/27/16 1804 09/28/16 0647 09/29/16 0617 09/30/16 0615  WBC 16.1* 9.5 7.6 7.7  HGB 12.3* 11.3* 9.8* 9.7*  HCT 36.6* 33.7* 29.4* 29.1*  MCV 79.4 79.1 79.7 79.7  PLT 142* 140* 98* 100*   Basic Metabolic Panel:  Recent Labs Lab 09/27/16 1804 09/28/16 0647 09/29/16 0617 09/30/16 0615  NA 145 150* 148* 149*  K 4.2 3.0* 3.3* 3.7  CL 116* 118* 123* 126*  CO2 19* 21* 20* 19*  GLUCOSE 132* 86 80 79  BUN 72* 76* 50* 32*  CREATININE 3.88* 3.47* 2.75* 2.09*  CALCIUM 8.9 8.5* 8.0* 8.1*  MG  --   --  1.9  --    GFR: Estimated Creatinine Clearance: 25.5 mL/min (A) (by C-G formula based on SCr of 2.09 mg/dL (H)).  Liver Function Tests:  Recent Labs Lab 09/27/16 1804  AST 44*  ALT 11*  ALKPHOS 75  BILITOT 1.8*  PROT 7.0  ALBUMIN 3.5    Recent Labs Lab 09/27/16 1804  LIPASE 68*   No results for input(s): AMMONIA in the last 168 hours. Coagulation Profile: No results for input(s): INR, PROTIME in the last 168 hours. Cardiac Enzymes: No results for input(s): CKTOTAL, CKMB, CKMBINDEX, TROPONINI in the last 168 hours. BNP (last 3 results) No results for input(s): PROBNP in the last 8760 hours. HbA1C: No results for input(s): HGBA1C in the last 72 hours. CBG: No results for input(s): GLUCAP in the last 168 hours. Lipid Profile: No results for input(s): CHOL, HDL, LDLCALC, TRIG, CHOLHDL, LDLDIRECT in the last 72 hours. Thyroid Function Tests: No results for input(s): TSH, T4TOTAL, FREET4, T3FREE, THYROIDAB in the last 72 hours. Anemia Panel: No results for input(s): VITAMINB12, FOLATE, FERRITIN, TIBC, IRON, RETICCTPCT in the last 72 hours. Sepsis Labs: No results for  input(s): PROCALCITON, LATICACIDVEN in the last 168 hours.  Recent Results (from the past 240 hour(s))  C difficile quick scan w PCR reflex     Status: None   Collection Time: 09/28/16  3:57 AM  Result Value Ref Range Status   C Diff antigen NEGATIVE NEGATIVE Final   C Diff toxin NEGATIVE NEGATIVE Final   C Diff interpretation No C. difficile detected.  Final     Radiology Studies: No results found.  Scheduled Meds: . allopurinol  100 mg Oral Daily  . aspirin EC  81 mg Oral Daily  . chlorhexidine  15 mL Mouth Rinse BID  . donepezil  10 mg Oral Q breakfast  . doxazosin  4 mg Oral Daily  . famotidine  20 mg Oral Daily  . feeding supplement  1 Container Oral TID BM  . feeding supplement (ENSURE ENLIVE)  237 mL Oral TID BM  . heparin  5,000 Units Subcutaneous Q8H  . mouth rinse  15 mL Mouth Rinse q12n4p  . memantine  10 mg Oral Daily  . pantoprazole  40 mg Oral Daily   Continuous Infusions: . sodium chloride 100 mL/hr at 09/30/16 1505     LOS: 3 days   CHIU, Scheryl Marten, MD [facility] Pager [phone]  If 7PM-7AM, please contact night-coverage [email or web address] Password [email or web address] 09/30/2016, 7:00 PM

## 2016-10-01 LAB — BASIC METABOLIC PANEL
Anion gap: 7 (ref 5–15)
BUN: 22 mg/dL — AB (ref 6–20)
CALCIUM: 7.9 mg/dL — AB (ref 8.9–10.3)
CHLORIDE: 123 mmol/L — AB (ref 101–111)
CO2: 18 mmol/L — AB (ref 22–32)
CREATININE: 1.83 mg/dL — AB (ref 0.61–1.24)
GFR calc Af Amer: 39 mL/min — ABNORMAL LOW (ref >60)
GFR calc non Af Amer: 34 mL/min — ABNORMAL LOW (ref >60)
Glucose, Bld: 79 mg/dL (ref 65–99)
Potassium: 3.5 mmol/L (ref 3.5–5.1)
SODIUM: 148 mmol/L — AB (ref 135–145)

## 2016-10-01 MED ORDER — POTASSIUM CHLORIDE CRYS ER 20 MEQ PO TBCR
40.0000 meq | EXTENDED_RELEASE_TABLET | Freq: Once | ORAL | Status: AC
  Administered 2016-10-01: 40 meq via ORAL
  Filled 2016-10-01: qty 2

## 2016-10-01 NOTE — Discharge Summary (Addendum)
Physician Discharge Summary  Anthony [name]:[id number] DOB: 1936-04-28 DOA: 09/27/2016  PCP: Romero Belling, MD  Admit date: 09/27/2016 Discharge date: 10/03/2016  Admitted From: Home Disposition:  SNF  Recommendations for Outpatient Follow-up:  1. Follow up with PCP in 2-3 weeks 2. Please arrange Palliative Care follow up at facility 3. If patient becomes hypertensive, consider starting low dose hydralazine  Discharge Condition:Improved CODE STATUS:Full Diet recommendation: Regular   Brief/Interim Summary: 81 y.o.malewith medical history significant of HTN, Alzheimer's dementia, DM type II, BPH, CKD stage III; who presents with complaints of nausea, vomiting,and diarrhea for the last 4 days. Patient has dementia and much of history is obtained from the patient's daughter. At baseline patient lives with his daughter at home who is his primary caregiver. Over the last month or so the patient has had at least a 30 pound weight loss. He has had decreased appetite and difficulty remembering to chew and swallow his food. Family has been trying to supplement food with Ensure shakes. Patient has had no recent sick contacts to her knowledge. He has been vomiting his food and medications. She reports anywhere from 4-5 episodes of diarrhea daily. Symptoms have waxed and waned in intensity. They had not tried anything to treat symptoms of nausea,vomiting,or diarrhea.Associated symptoms including incontinence for which she had to recently by the patient adult briefs in the last 1-2 weeks Denies any significant dysuria, blood in stool, abdominal pain, chest pain, cough, shortness of breath, or recent antibiotic. In route with EMS patient's systolic blood pressure was noted to be in the 60s for which he was given an initial bolus of fluids.  ED Course:Upon admission into the emergency department patient was noted to have a temperature of 96.24F, pulse 98-100, respirations 13-21, blood pressure as  low as 94/73, and O2 saturations maintained on 2 L of [place] oxygen. Labs revealed WBC 16.1, hemoglobin 12.3, platelets 142, BUN 72, creatinine 3.88, AST 44, ALT 11, BUN 1.8, lipase 68, and all other values relatively within normal limits. While in the ED patient was given 1 L of normal saline IV fluids and 4 mg of Zofran. Bladder scan showed only approximately 100 mlof urine present.  Nausea, vomiting, and diarrhea: Acute. Patient showing signs of acute dehydration.Question the possibility of a viral gastroenteritis  - Xray abd reviewed. No evidence of obstruction - C. Difficile neg - Stool culture was ordered, however patient has not produced enough stool to test - Zofran is continue prn N/V - TSH within normal limits - Much improved  Acute renal failuresuperimposed on chronic kidney disease stage III: Patient's baseline creatinine previously 1.9 -2.25, but patient presents with a creatinine of 3.85 and BUN of 72 to suggest prerenal cause of symptoms. - Patient continued on IVF NS at 100cc/hr  - Continue to hold nephrotoxic agents - real function much improved  Dysphagia: Patient reportedly with a 30 pound weight loss in the last 1 month due to dysphagia. Suspect symptoms secondary to worsening of patient's dementia. As per family reports it seems the patient forgets to eat or chew meals. Family questioning need of PEG. - Speech therapy conditions are noted - Continue to encourage PO as tolerated - Stable at present  Leukocytosis: Acute. WBC 16.1. - Check urinalysis - unremarkable - Currently resolved  Transient hypotension: Resolved. Initial systolic blood pressures reported to be in the 60s, but patient responded to initial fluid resuscitation. - Improved with hydration.  Essential hypertension - losartan-hydrochlorothiazide remains on hold as per above - Given  concerns of ARF, would stop - If BP meds needed, consider hydralazine  Anemiaof chronic disease: Hemoglobin 12.3  on admission  - Ferrous sulfate was held initially 2/2 n/v  - hgb currently stable  Dementia, with behavior disturbance: Stable. - Continue on Aricept and Namenda - Have added PRN ativan for agitation - Concerns of progressive decline given significant weight loss noted over the past several months - Really appreciate input by palliative care. Recommendations are noted. Patient is planned for transfer to skilled nursing facility with palliative care to follow at the facility.  Thrombocytopenia: Platelet count 142on admission,but no acute signs of bleeding noted. - Will continue to Monitor   BPH: Bladder scan showed no significant signs of obstruction. - continue Doxazosin as tolerated - Stable  Gout - Had reduced allopurinol 100 mg daily due to kidney function - Currently stable  GERD - Continue Protonix - Presently stable   hypokalemia  Discharge Diagnoses:  Principal Problem:   Nausea vomiting and diarrhea Active Problems:   Leukocytosis   Dysphagia   Acute renal failure superimposed on stage 3 chronic kidney disease (HCC)   Transient hypotension   Anemia due to chronic kidney disease   Protein-calorie malnutrition, severe    Discharge Instructions   Allergies as of 10/01/2016      Reactions   Amlodipine-atorvastatin Other (See Comments)   REACTION: LEG CRAMPS   Benazepril Hcl Other (See Comments)   REACTION: Cough   Ezetimibe Other (See Comments)   REACTION: edema      Medication List    STOP taking these medications   losartan-hydrochlorothiazide 50-12.5 MG tablet Commonly known as:  HYZAAR     TAKE these medications   acetaminophen 325 MG tablet Commonly known as:  TYLENOL Take 2 tablets (650 mg total) by mouth every 6 (six) hours as needed for mild pain, moderate pain or headache (or Fever >/= 101).   allopurinol 300 MG tablet Commonly known as:  ZYLOPRIM Take 1 tablet (300 mg total) by mouth daily.   aspirin 81 MG tablet Take 1 tablet  (81 mg total) by mouth daily.   colestipol 1 g tablet Commonly known as:  COLESTID Take 5 g by mouth daily.   donepezil 10 MG tablet Commonly known as:  ARICEPT Take 1 tablet (10 mg total) by mouth daily with breakfast.   doxazosin 4 MG tablet Commonly known as:  CARDURA Take 1 tablet (4 mg total) by mouth daily.   famotidine 40 MG tablet Commonly known as:  PEPCID Take 1 tablet (40 mg total) by mouth at bedtime. What changed:  when to take this   Ferrous Sulfate 220 (44 Fe) MG/5ML Liqd Take 5 mLs by mouth 2 (two) times daily.   memantine 10 MG tablet Commonly known as:  NAMENDA Take 1 tablet (10 mg total) by mouth 2 (two) times daily. What changed:  when to take this   pantoprazole 40 MG tablet Commonly known as:  PROTONIX Take 1 tablet (40 mg total) by mouth 2 (two) times daily. What changed:  when to take this       Contact information for follow-up providers    Romero Belling, MD. Schedule an appointment as soon as possible for a visit in 2 week(s).   Specialty:  Endocrinology Contact information: [address] [place] [address] [phone]            Contact information for after-discharge care    Destination    [facility] .  Specialty:  Skilled Nursing Facility Contact information: [address] [place] [address] [phone]                 Allergies  Allergen Reactions  . Amlodipine-Atorvastatin Other (See Comments)    REACTION: LEG CRAMPS  . Benazepril Hcl Other (See Comments)    REACTION: Cough  . Ezetimibe Other (See Comments)    REACTION: edema    Consultations:  Palliative Care  Procedures/Studies: Dg Abd Acute W/chest  Result Date: 09/27/2016 CLINICAL DATA:  81 y/o male with emesis and diarrhea x3-4 days. Pt's family notes decreased appetite as well. No hx abd issues. EXAM: DG ABDOMEN ACUTE W/ 1V CHEST COMPARISON:  04/11/2016 FINDINGS: Normal cardiac  silhouette. Mild bibasilar atelectasis. No pneumothorax. No evidence of intraperitoneal free air on the decubitus view. Gas-filled loops of colon are prominent but within normal limits. No dilated loops of small bowel. No pathologic calcifications. No organomegaly. IMPRESSION: 1. Bibasilar atelectasis. 2. No intraperitoneal free air identified.  No bowel obstruction. 3. Gas distended colon. Electronically Signed   By: Genevive Bi M.D.   On: 09/27/2016 20:44    Subjective: No complaints  Discharge Exam: Vitals:   09/30/16 2240 10/01/16 0638  BP: (!) 168/57 123/61  Pulse: 61 60  Resp: 14 16  Temp: 97.7 F (36.5 C) 97.6 F (36.4 C)   Vitals:   09/30/16 0542 09/30/16 1445 09/30/16 2240 10/01/16 0638  BP: (!) 126/97 (!) 147/82 (!) 168/57 123/61  Pulse: 68 69 61 60  Resp: Temp: 98.4 F (36.9 C) 97.8 F (36.6 C) 97.7 F (36.5 C) 97.6 F (36.4 C)  TempSrc: Axillary Axillary Axillary Axillary  SpO2: 97% 98% 96% 99%  Weight: 62.6 kg (138 lb) 62 kg (136 lb 11 oz)      General: Pt is awake, not in acute distress Cardiovascular: RRR, S1/S2 +, no rubs, no gallops Respiratory: CTA bilaterally, no wheezing, no rhonchi Abdominal: Soft, NT, ND, bowel sounds + Extremities: no edema, no cyanosis   The results of significant diagnostics from this hospitalization (including imaging, microbiology, ancillary and laboratory) are listed below for reference.     Microbiology: Recent Results (from the past 240 hour(s))  C difficile quick scan w PCR reflex     Status: None   Collection Time: 09/28/16  3:57 AM  Result Value Ref Range Status   C Diff antigen NEGATIVE NEGATIVE Final   C Diff toxin NEGATIVE NEGATIVE Final   C Diff interpretation No C. difficile detected.  Final     Labs: BNP (last 3 results) No results for input(s): BNP in the last 8760 hours. Basic Metabolic Panel:  Recent Labs Lab 09/27/16 1804 09/28/16 0647 09/29/16 0617 09/30/16 0615 10/01/16 0655   NA 145 150* 148* 149* 148*  K 4.2 3.0* 3.3* 3.7 3.5  CL 116* 118* 123* 126* 123*  CO2 19* 21* 20* 19* 18*  GLUCOSE 132* 86 80 79 79  BUN 72* 76* 50* 32* 22*  CREATININE 3.88* 3.47* 2.75* 2.09* 1.83*  CALCIUM 8.9 8.5* 8.0* 8.1* 7.9*  MG  --   --  1.9  --   --    Liver Function Tests:  Recent Labs Lab 09/27/16 1804  AST 44*  ALT 11*  ALKPHOS 75  BILITOT 1.8*  PROT 7.0  ALBUMIN 3.5    Recent Labs Lab 09/27/16 1804  LIPASE 68*   No results for input(s): AMMONIA in the last 168 hours. CBC:  Recent Labs  Lab 09/27/16 1804 09/28/16 0647 09/29/16 0617 09/30/16 0615  WBC 16.1* 9.5 7.6 7.7  HGB 12.3* 11.3* 9.8* 9.7*  HCT 36.6* 33.7* 29.4* 29.1*  MCV 79.4 79.1 79.7 79.7  PLT 142* 140* 98* 100*   Cardiac Enzymes: No results for input(s): CKTOTAL, CKMB, CKMBINDEX, TROPONINI in the last 168 hours. BNP: Invalid input(s): POCBNP CBG: No results for input(s): GLUCAP in the last 168 hours. D-Dimer No results for input(s): DDIMER in the last 72 hours. Hgb A1c No results for input(s): HGBA1C in the last 72 hours. Lipid Profile No results for input(s): CHOL, HDL, LDLCALC, TRIG, CHOLHDL, LDLDIRECT in the last 72 hours. Thyroid function studies No results for input(s): TSH, T4TOTAL, T3FREE, THYROIDAB in the last 72 hours.  Invalid input(s): FREET3 Anemia work up No results for input(s): VITAMINB12, FOLATE, FERRITIN, TIBC, IRON, RETICCTPCT in the last 72 hours. Urinalysis    Component Value Date/Time   COLORURINE YELLOW 09/27/2016 2012   APPEARANCEUR CLEAR 09/27/2016 2012   LABSPEC 1.019 09/27/2016 2012   PHURINE 5.0 09/27/2016 2012   GLUCOSEU NEGATIVE 09/27/2016 2012   GLUCOSEU NEGATIVE 09/11/2016 1124   HGBUR NEGATIVE 09/27/2016 2012   BILIRUBINUR NEGATIVE 09/27/2016 2012   KETONESUR NEGATIVE 09/27/2016 2012   PROTEINUR NEGATIVE 09/27/2016 2012   UROBILINOGEN 0.2 09/11/2016 1124   NITRITE NEGATIVE 09/27/2016 2012   LEUKOCYTESUR NEGATIVE 09/27/2016 2012    Sepsis Labs Invalid input(s): PROCALCITONIN,  WBC,  LACTICIDVEN Microbiology Recent Results (from the past 240 hour(s))  C difficile quick scan w PCR reflex     Status: None   Collection Time: 09/28/16  3:57 AM  Result Value Ref Range Status   C Diff antigen NEGATIVE NEGATIVE Final   C Diff toxin NEGATIVE NEGATIVE Final   C Diff interpretation No C. difficile detected.  Final     SIGNED:   Jerald Kief, MD  [facility] 10/03/2016, 8:09 PM  If 7PM-7AM, please contact night-coverage [email or web address] Password [email or web address]

## 2016-10-01 NOTE — Progress Notes (Signed)
Pt discharged to snf. Left unit on stretcher pushed by [facility] staff transporters. Left in good condition accompanied by daughter. VWilliams,rn.

## 2016-10-01 NOTE — Progress Notes (Signed)
Report called and given to Annette Stable, nurse at [facility]. All nurse's questions answered to his satisfaction. All meds received in the hospital noted on the AVS and placed in manila envelope with patient's information. Derinda Sis.

## 2016-10-01 NOTE — Clinical Social Work Placement (Signed)
Patient received and accepted bed offer at [facility]. Patient's RN can call report to [phone], patient going to room 211. [facility] contacted, family aware.   CLINICAL SOCIAL WORK PLACEMENT  NOTE  Date:  10/01/2016  Patient Details  Name: Anthony [name] MRN: [id number] Date of Birth: 1935-10-17  Clinical Social Work is seeking post-discharge placement for this patient at the Skilled  Nursing Facility level of care (*CSW will initial, date and re-position this form in  chart as items are completed):  Yes   Patient/family provided with [facility] Clinical Social Work Department's list of facilities offering this level of care within the geographic area requested by the patient (or if unable, by the patient's family).  Yes   Patient/family informed of their freedom to choose among providers that offer the needed level of care, that participate in Medicare, Medicaid or managed care program needed by the patient, have an available bed and are willing to accept the patient.  Yes   Patient/family informed of [facility]'s ownership interest in [facility] and [facility], as well as of the fact that they are under no obligation to receive care at these facilities.  PASRR submitted to EDS on       PASRR number received on 09/30/16     Existing PASRR number confirmed on       FL2 transmitted to all facilities in geographic area requested by pt/family on 09/30/16     FL2 transmitted to all facilities within larger geographic area on       Patient informed that his/her managed care company has contracts with or will negotiate with certain facilities, including the following:        Yes   Patient/family informed of bed offers received.  Patient chooses bed at [facility]     Physician recommends and patient chooses bed at      Patient to be transferred to [facility] on 10/01/16.  Patient to be transferred to facility by [facility]     Patient  family notified on 10/01/16 of transfer.  Name of family member notified:  Sharol Given; Sherlie Ban;     PHYSICIAN       Additional Comment:    _______________________________________________ Antionette Poles, LCSW 10/01/2016, 1:49 PM

## 2016-10-19 ENCOUNTER — Telehealth: Payer: Self-pay | Admitting: Endocrinology

## 2016-10-19 NOTE — Telephone Encounter (Signed)
[facility] calling [facility] requesting a VO to see pt twice a week for 3 weeks please following the recent discharge from the nursing home  # [phone]

## 2016-10-19 NOTE — Telephone Encounter (Signed)
OK 

## 2016-10-25 ENCOUNTER — Encounter: Payer: Self-pay | Admitting: Endocrinology

## 2016-10-25 ENCOUNTER — Ambulatory Visit (INDEPENDENT_AMBULATORY_CARE_PROVIDER_SITE_OTHER): Payer: Medicare Other | Admitting: Endocrinology

## 2016-10-25 VITALS — BP 148/88 | HR 66 | Ht 69.0 in | Wt 145.0 lb

## 2016-10-25 DIAGNOSIS — M1A9XX0 Chronic gout, unspecified, without tophus (tophi): Secondary | ICD-10-CM

## 2016-10-25 DIAGNOSIS — D631 Anemia in chronic kidney disease: Secondary | ICD-10-CM

## 2016-10-25 DIAGNOSIS — F329 Major depressive disorder, single episode, unspecified: Secondary | ICD-10-CM | POA: Insufficient documentation

## 2016-10-25 DIAGNOSIS — N183 Chronic kidney disease, stage 3 unspecified: Secondary | ICD-10-CM

## 2016-10-25 DIAGNOSIS — F32A Depression, unspecified: Secondary | ICD-10-CM

## 2016-10-25 DIAGNOSIS — N189 Chronic kidney disease, unspecified: Secondary | ICD-10-CM | POA: Diagnosis not present

## 2016-10-25 LAB — CBC WITH DIFFERENTIAL/PLATELET
Basophils Absolute: 0 10*3/uL (ref 0.0–0.1)
Basophils Relative: 0.5 % (ref 0.0–3.0)
EOS ABS: 0.2 10*3/uL (ref 0.0–0.7)
Eosinophils Relative: 2.8 % (ref 0.0–5.0)
HCT: 33.4 % — ABNORMAL LOW (ref 39.0–52.0)
Hemoglobin: 10.5 g/dL — ABNORMAL LOW (ref 13.0–17.0)
LYMPHS ABS: 2 10*3/uL (ref 0.7–4.0)
Lymphocytes Relative: 26.6 % (ref 12.0–46.0)
MCHC: 31.5 g/dL (ref 30.0–36.0)
MCV: 83.5 fl (ref 78.0–100.0)
MONO ABS: 0.4 10*3/uL (ref 0.1–1.0)
Monocytes Relative: 5.6 % (ref 3.0–12.0)
NEUTROS PCT: 64.5 % (ref 43.0–77.0)
Neutro Abs: 4.8 10*3/uL (ref 1.4–7.7)
Platelets: 247 10*3/uL (ref 150.0–400.0)
RBC: 4 Mil/uL — AB (ref 4.22–5.81)
RDW: 16.4 % — ABNORMAL HIGH (ref 11.5–15.5)
WBC: 7.4 10*3/uL (ref 4.0–10.5)

## 2016-10-25 LAB — IBC PANEL
Iron: 54 ug/dL (ref 42–165)
SATURATION RATIOS: 28.6 % (ref 20.0–50.0)
TRANSFERRIN: 135 mg/dL — AB (ref 212.0–360.0)

## 2016-10-25 LAB — BASIC METABOLIC PANEL
BUN: 13 mg/dL (ref 6–23)
CALCIUM: 8.7 mg/dL (ref 8.4–10.5)
CO2: 30 mEq/L (ref 19–32)
Chloride: 106 mEq/L (ref 96–112)
Creatinine, Ser: 1.64 mg/dL — ABNORMAL HIGH (ref 0.40–1.50)
GFR: 52.41 mL/min — AB (ref >60.00)
Glucose, Bld: 104 mg/dL — ABNORMAL HIGH (ref 70–99)
POTASSIUM: 4 meq/L (ref 3.5–5.1)
SODIUM: 139 meq/L (ref 135–145)

## 2016-10-25 LAB — URIC ACID: URIC ACID, SERUM: 4.3 mg/dL (ref 4.0–7.8)

## 2016-10-25 MED ORDER — ENSURE NUTRITION SHAKE PO LIQD: 1.0000 | Freq: Four times a day (QID) | ORAL | 11 refills | Status: DC

## 2016-10-25 NOTE — Progress Notes (Signed)
Subjective:    Patient ID: Anthony [name], male    DOB: [birth year], 81 y.o.   MRN: [id number]  HPI  The state of at least three ongoing medical problems is addressed today, with interval history of each noted here: Malnutrition: he has gained weight.  He drinks ensure and pureed diet.  Dementia: dtr says depression is much better.   HTN: he denies sob. Past Medical History:  Diagnosis Date  . ABNORMAL ELECTROCARDIOGRAM 05/03/2010  . Alzheimer disease   . DIABETES MELLITUS, TYPE II 03/15/2007  . GALLSTONES 03/15/2007  . GOUT 03/15/2007  . HYPERLIPIDEMIA 03/15/2007  . HYPERTENSION 03/15/2007  . NEPHROPATHY, DIABETIC 03/15/2007  . RENAL INSUFFICIENCY 04/30/2009  . SHOULDER PAIN, BILATERAL 04/29/2008  . UROLITHIASIS, HX OF 03/15/2007    Past Surgical History:  Procedure Laterality Date  . NASAL SEPTUM SURGERY  1978    Social History   Social History  . Marital status: Divorced    Spouse name: N/A  . Number of children: N/A  . Years of education: N/A   Occupational History  . Retired    Social History Main Topics  . Smoking status: Never Smoker  . Smokeless tobacco: Never Used  . Alcohol use No  . Drug use: No  . Sexual activity: No   Other Topics Concern  . Not on file   Social History Narrative   NOK is Daughter Sharol Given          Current Outpatient Prescriptions on File Prior to Visit  Medication Sig Dispense Refill  . acetaminophen (TYLENOL) 325 MG tablet Take 2 tablets (650 mg total) by mouth every 6 (six) hours as needed for mild pain, moderate pain or headache (or Fever >/= 101). 40 tablet 0  . allopurinol (ZYLOPRIM) 300 MG tablet Take 1 tablet (300 mg total) by mouth daily. 90 tablet 3  . aspirin 81 MG tablet Take 1 tablet (81 mg total) by mouth daily. 90 tablet 2  . colestipol (COLESTID) 1 g tablet Take 5 g by mouth daily.    Mar[facility] Kitchen donepezil (ARICEPT) 10 MG tablet Take 1 tablet (10 mg total) by mouth daily with breakfast. 90 tablet 3  . doxazosin  (CARDURA) 4 MG tablet Take 1 tablet (4 mg total) by mouth daily. 90 tablet 3  . famotidine (PEPCID) 40 MG tablet Take 1 tablet (40 mg total) by mouth at bedtime. (Patient taking differently: Take 40 mg by mouth daily. ) 30 tablet 1  . Ferrous Sulfate 220 (44 Fe) MG/5ML LIQD Take 5 mLs by mouth 2 (two) times daily. 473 mL 11  . memantine (NAMENDA) 10 MG tablet Take 1 tablet (10 mg total) by mouth 2 (two) times daily. (Patient taking differently: Take 10 mg by mouth daily. ) 180 tablet 3  . pantoprazole (PROTONIX) 40 MG tablet Take 1 tablet (40 mg total) by mouth 2 (two) times daily. (Patient taking differently: Take 40 mg by mouth daily. ) 180 tablet 3   No current facility-administered medications on file prior to visit.     Allergies  Allergen Reactions  . Amlodipine-Atorvastatin Other (See Comments)    REACTION: LEG CRAMPS  . Benazepril Hcl Other (See Comments)    REACTION: Cough  . Ezetimibe Other (See Comments)    REACTION: edema    Family History  Problem Relation Age of Onset  . Cancer Father        Prostate Cancer  . Colon cancer Neg Hx   . Esophageal cancer Neg Hx   .  Stomach cancer Neg Hx     BP (!) 148/88   Pulse 66   Ht 5\' 9"  (1.753 m)   Wt 145 lb (65.8 kg)   SpO2 92%   BMI 21.41 kg/m     Review of Systems Denies falls and edema    Objective:   Physical Exam VITAL SIGNS:  See vs page GENERAL: no distress. Gait: slow but steady PSYCH: Alert.  Oriented to self only.  Speaks only when spoken to.       Assessment & Plan:  Malnutrition: improved.  Please continue the same rx. Dementia: Please continue the same medications for depression.  He has high risk for readmission.     HTN: slightly high today.  Due to recent hypotension, we won't increase rx for now.  Patient Instructions  Here is a prescription for ensure.   We can hold off on medication for blood pressure for now. Please see a psychiatry specialist.  you will receive a phone call, about a day  and time for an appointment. blood tests are requested for you today.  We'll let you know about the results.  Please come back for a follow-up appointment in 2 months.

## 2016-10-25 NOTE — Patient Instructions (Addendum)
Here is a prescription for ensure.   We can hold off on medication for blood pressure for now. Please see a psychiatry specialist.  you will receive a phone call, about a day and time for an appointment. blood tests are requested for you today.  We'll let you know about the results.  Please come back for a follow-up appointment in 2 months.

## 2016-10-26 LAB — VITAMIN D 25 HYDROXY (VIT D DEFICIENCY, FRACTURES): VITD: 22.31 ng/mL — AB (ref 30.00–100.00)

## 2016-10-26 LAB — PTH, INTACT AND CALCIUM
CALCIUM: 8.2 mg/dL — AB (ref 8.6–10.3)
PTH: 64 pg/mL (ref 14–64)

## 2016-10-26 MED ORDER — VITAMIN D (ERGOCALCIFEROL) 1.25 MG (50000 UNIT) PO CAPS: 50000.0000 [IU] | ORAL_CAPSULE | ORAL | 0 refills | Status: AC

## 2016-11-14 ENCOUNTER — Other Ambulatory Visit: Payer: Self-pay | Admitting: Endocrinology

## 2016-11-14 NOTE — Telephone Encounter (Signed)
please call dtr, to verify he is on this.  If so, please refill prn

## 2016-11-14 NOTE — Telephone Encounter (Signed)
Please advise if ok to refill the requested medications? Prescriptions are listed under a historical provider. Thanks!

## 2016-11-14 NOTE — Telephone Encounter (Signed)
Angelia called in to advise that the following scripts need to be called in. Prescribed by the hospitalist   Verified [facility] on [address]   mirtazipine 15mg  tablets take 1 tab by mouth at bedtime trazodine 50 mg tablets take 1/2 tablet by mouth once daily at bedtime

## 2016-11-15 ENCOUNTER — Other Ambulatory Visit: Payer: Self-pay

## 2016-11-15 MED ORDER — MIRTAZAPINE 15 MG PO TBDP: 15.0000 mg | ORAL_TABLET | Freq: Every day | ORAL | 3 refills | Status: DC

## 2016-11-15 MED ORDER — TRAZODONE HCL 50 MG PO TABS: ORAL_TABLET | ORAL | 3 refills | Status: DC

## 2016-11-15 NOTE — Telephone Encounter (Signed)
Spoke with the patient's daughter and she did verify that she wants her dad to continue these medications because he is doing very well on them- patient is scheduled to see therapist in August but needs these filled until then

## 2016-12-26 ENCOUNTER — Encounter: Payer: Self-pay | Admitting: Endocrinology

## 2016-12-26 ENCOUNTER — Ambulatory Visit (INDEPENDENT_AMBULATORY_CARE_PROVIDER_SITE_OTHER): Payer: Medicare Other | Admitting: Endocrinology

## 2016-12-26 VITALS — BP 150/80 | HR 92 | Temp 97.7°F | Ht 69.0 in | Wt 140.0 lb

## 2016-12-26 DIAGNOSIS — I10 Essential (primary) hypertension: Secondary | ICD-10-CM

## 2016-12-26 MED ORDER — DOXAZOSIN MESYLATE 2 MG PO TABS: 2.0000 mg | ORAL_TABLET | Freq: Every day | ORAL | 5 refills | Status: DC

## 2016-12-26 NOTE — Patient Instructions (Addendum)
We can hold off on medication for blood pressure for now.   Please see the psychiatry specialist as scheduled  Please reduce the doxazosin to 2 mg daily Please come back for a follow-up appointment in 3 months.

## 2016-12-26 NOTE — Progress Notes (Signed)
Subjective:    Patient ID: Anthony [name], male    DOB: 01-23-1936, 81 y.o.   MRN: [id number]  HPI  The state of at least three ongoing medical problems is addressed today, with interval history of each noted here: hx is from dtr, due to pt's dementia.   Weight loss: He has lost 5 lbs, since last ov, despite better PO intake.  This is a stable problem. Vit-D deficiency: he denies cramps.  This is a stable problem. Anemia: he denies BRBPR.  This is a stable problem. HTN: he denies sob.  This is a stable problem. Past Medical History:  Diagnosis Date  . ABNORMAL ELECTROCARDIOGRAM 05/03/2010  . Alzheimer disease   . DIABETES MELLITUS, TYPE II 03/15/2007  . GALLSTONES 03/15/2007  . GOUT 03/15/2007  . HYPERLIPIDEMIA 03/15/2007  . HYPERTENSION 03/15/2007  . NEPHROPATHY, DIABETIC 03/15/2007  . RENAL INSUFFICIENCY 04/30/2009  . SHOULDER PAIN, BILATERAL 04/29/2008  . UROLITHIASIS, HX OF 03/15/2007    Past Surgical History:  Procedure Laterality Date  . NASAL SEPTUM SURGERY  1978    Social History   Social History  . Marital status: Divorced    Spouse name: N/A  . Number of children: N/A  . Years of education: N/A   Occupational History  . Retired    Social History Main Topics  . Smoking status: Never Smoker  . Smokeless tobacco: Never Used  . Alcohol use No  . Drug use: No  . Sexual activity: No   Other Topics Concern  . Not on file   Social History Narrative   NOK is Daughter Anthony [name]          Current Outpatient Prescriptions on File Prior to Visit  Medication Sig Dispense Refill  . acetaminophen (TYLENOL) 325 MG tablet Take 2 tablets (650 mg total) by mouth every 6 (six) hours as needed for mild pain, moderate pain or headache (or Fever >/= 101). 40 tablet 0  . allopurinol (ZYLOPRIM) 300 MG tablet Take 1 tablet (300 mg total) by mouth daily. 90 tablet 3  . aspirin 81 MG tablet Take 1 tablet (81 mg total) by mouth daily. 90 tablet 2  . colestipol (COLESTID)  1 g tablet Take 5 g by mouth daily.    Marland Kitchen donepezil (ARICEPT) 10 MG tablet Take 1 tablet (10 mg total) by mouth daily with breakfast. 90 tablet 3  . famotidine (PEPCID) 40 MG tablet Take 1 tablet (40 mg total) by mouth at bedtime. (Patient taking differently: Take 40 mg by mouth daily. ) 30 tablet 1  . Ferrous Sulfate 220 (44 Fe) MG/5ML LIQD Take 5 mLs by mouth 2 (two) times daily. 473 mL 11  . memantine (NAMENDA) 10 MG tablet Take 1 tablet (10 mg total) by mouth 2 (two) times daily. (Patient taking differently: Take 10 mg by mouth daily. ) 180 tablet 3  . mirtazapine (REMERON SOL-TAB) 15 MG disintegrating tablet Take 1 tablet (15 mg total) by mouth at bedtime. 30 tablet 3  . Nutritional Supplements (ENSURE NUTRITION SHAKE) LIQD Take 1 Bottle by mouth 4 (four) times daily. 120 Bottle 11  . pantoprazole (PROTONIX) 40 MG tablet Take 1 tablet (40 mg total) by mouth 2 (two) times daily. (Patient taking differently: Take 40 mg by mouth daily. ) 180 tablet 3  . traZODone (DESYREL) 50 MG tablet Take 1/2 tablet at bedtime 15 tablet 3   No current facility-administered medications on file prior to visit.     Allergies  Allergen Reactions  .  Amlodipine-Atorvastatin Other (See Comments)    REACTION: LEG CRAMPS  . Benazepril Hcl Other (See Comments)    REACTION: Cough  . Ezetimibe Other (See Comments)    REACTION: edema    Family History  Problem Relation Age of Onset  . Cancer Father        Prostate Cancer  . Colon cancer Neg Hx   . Esophageal cancer Neg Hx   . Stomach cancer Neg Hx     BP (!) 150/80   Pulse 92   Temp 97.7 F (36.5 C) (Oral)   Ht 5\' 9"  (1.753 m)   Wt 140 lb (63.5 kg)   SpO2 92%   BMI 20.67 kg/m    Review of Systems Denies hematuria and decreased urinary stream.      Objective:   Physical Exam VITAL SIGNS:  See vs page GENERAL: no distress LUNGS:  Clear to auscultation.   HEART:  Regular rate and rhythm without murmurs noted. Normal S1,S2.   Gait: slow but  steady.       Assessment & Plan:  HTN: we should find an alternative to cardura, as HTN is ISH.  Vit-D deficiency: s/p supplementation Memory loss, persistent.  Anemia: he declines f/u labs today.    Patient Instructions  We can hold off on medication for blood pressure for now.   Please see the psychiatry specialist as scheduled  Please reduce the doxazosin to 2 mg daily Please come back for a follow-up appointment in 3 months.

## 2017-01-11 ENCOUNTER — Ambulatory Visit: Payer: Self-pay | Admitting: Endocrinology

## 2017-01-19 ENCOUNTER — Other Ambulatory Visit: Payer: Self-pay

## 2017-02-07 ENCOUNTER — Ambulatory Visit (HOSPITAL_COMMUNITY): Payer: Self-pay | Admitting: Psychiatry

## 2017-03-16 ENCOUNTER — Other Ambulatory Visit: Payer: Self-pay | Admitting: Endocrinology

## 2017-03-28 ENCOUNTER — Encounter: Payer: Self-pay | Admitting: Endocrinology

## 2017-03-28 ENCOUNTER — Ambulatory Visit (INDEPENDENT_AMBULATORY_CARE_PROVIDER_SITE_OTHER): Payer: Medicare Other | Admitting: Endocrinology

## 2017-03-28 VITALS — BP 170/98 | HR 77 | Wt 152.6 lb

## 2017-03-28 DIAGNOSIS — E559 Vitamin D deficiency, unspecified: Secondary | ICD-10-CM

## 2017-03-28 DIAGNOSIS — D649 Anemia, unspecified: Secondary | ICD-10-CM

## 2017-03-28 DIAGNOSIS — N259 Disorder resulting from impaired renal tubular function, unspecified: Secondary | ICD-10-CM | POA: Diagnosis not present

## 2017-03-28 DIAGNOSIS — D72829 Elevated white blood cell count, unspecified: Secondary | ICD-10-CM | POA: Diagnosis not present

## 2017-03-28 DIAGNOSIS — Z0000 Encounter for general adult medical examination without abnormal findings: Secondary | ICD-10-CM | POA: Diagnosis not present

## 2017-03-28 DIAGNOSIS — Z23 Encounter for immunization: Secondary | ICD-10-CM | POA: Diagnosis not present

## 2017-03-28 LAB — CBC WITH DIFFERENTIAL/PLATELET
BASOS PCT: 0.8 % (ref 0.0–3.0)
Basophils Absolute: 0 10*3/uL (ref 0.0–0.1)
EOS PCT: 3.2 % (ref 0.0–5.0)
Eosinophils Absolute: 0.2 10*3/uL (ref 0.0–0.7)
HCT: 35.9 % — ABNORMAL LOW (ref 39.0–52.0)
Hemoglobin: 11.1 g/dL — ABNORMAL LOW (ref 13.0–17.0)
LYMPHS ABS: 2 10*3/uL (ref 0.7–4.0)
LYMPHS PCT: 34.5 % (ref 12.0–46.0)
MCHC: 31 g/dL (ref 30.0–36.0)
MCV: 83.5 fl (ref 78.0–100.0)
MONO ABS: 0.3 10*3/uL (ref 0.1–1.0)
Monocytes Relative: 5.2 % (ref 3.0–12.0)
NEUTROS PCT: 56.3 % (ref 43.0–77.0)
Neutro Abs: 3.3 10*3/uL (ref 1.4–7.7)
PLATELETS: 115 10*3/uL — AB (ref 150.0–400.0)
RBC: 4.3 Mil/uL (ref 4.22–5.81)
RDW: 15.6 % — AB (ref 11.5–15.5)
WBC: 5.9 10*3/uL (ref 4.0–10.5)

## 2017-03-28 LAB — BASIC METABOLIC PANEL
BUN: 16 mg/dL (ref 6–23)
CALCIUM: 9 mg/dL (ref 8.4–10.5)
CO2: 29 mEq/L (ref 19–32)
Chloride: 108 mEq/L (ref 96–112)
Creatinine, Ser: 1.76 mg/dL — ABNORMAL HIGH (ref 0.40–1.50)
GFR: 48.26 mL/min — AB (ref >60.00)
Glucose, Bld: 97 mg/dL (ref 70–99)
Potassium: 3.4 mEq/L — ABNORMAL LOW (ref 3.5–5.1)
SODIUM: 143 meq/L (ref 135–145)

## 2017-03-28 LAB — VITAMIN D 25 HYDROXY (VIT D DEFICIENCY, FRACTURES): VITD: 30.85 ng/mL (ref 30.00–100.00)

## 2017-03-28 LAB — IBC PANEL
IRON: 115 ug/dL (ref 42–165)
Saturation Ratios: 48.3 % (ref 20.0–50.0)
TRANSFERRIN: 170 mg/dL — AB (ref 212.0–360.0)

## 2017-03-28 MED ORDER — FUROSEMIDE 20 MG PO TABS: 20.0000 mg | ORAL_TABLET | Freq: Every day | ORAL | 3 refills | Status: DC

## 2017-03-28 NOTE — Patient Instructions (Addendum)
blood tests are requested for you today.  We'll let you know about the results. I have sent a prescription to your pharmacy, to resume the fluid pill.  Please let us know if you want to see a hand specialist.   Please come back for a follow-up appointment in 1 month.   Please consider these measures for your health:  minimize alcohol.  Do not use tobacco products.  Have a colonoscopy at least every 10 years from age 81.  Keep firearms safely stored.  Always use seat belts.  have working smoke alarms in your home.  See an eye doctor and dentist regularly.  Never drive under the influence of alcohol or drugs (including prescription drugs).   It is critically important to prevent falling down (keep floor areas well-lit, dry, and free of loose objects.  If you have a cane, walker, or wheelchair, you should use it, even for short trips around the house.  Wear flat-soled shoes.  Also, try not to rush).

## 2017-03-28 NOTE — Progress Notes (Signed)
Subjective:    Patient ID: Anthony [name], male    DOB: [birth year], 81 y.o.   MRN: [id number]  HPI Pt states 10 days of intermitt moderate "cramping" of the right hand, and assoc pain.  Past Medical History:  Diagnosis Date  . ABNORMAL ELECTROCARDIOGRAM 05/03/2010  . Alzheimer disease   . DIABETES MELLITUS, TYPE II 03/15/2007  . GALLSTONES 03/15/2007  . GOUT 03/15/2007  . HYPERLIPIDEMIA 03/15/2007  . HYPERTENSION 03/15/2007  . NEPHROPATHY, DIABETIC 03/15/2007  . RENAL INSUFFICIENCY 04/30/2009  . SHOULDER PAIN, BILATERAL 04/29/2008  . UROLITHIASIS, HX OF 03/15/2007    Past Surgical History:  Procedure Laterality Date  . NASAL SEPTUM SURGERY  1978    Social History   Social History  . Marital status: Divorced    Spouse name: N/A  . Number of children: N/A  . Years of education: N/A   Occupational History  . Retired    Social History Main Topics  . Smoking status: Never Smoker  . Smokeless tobacco: Never Used  . Alcohol use No  . Drug use: No  . Sexual activity: No   Other Topics Concern  . Not on file   Social History Narrative   NOK is Daughter Sharol Given          Current Outpatient Prescriptions on File Prior to Visit  Medication Sig Dispense Refill  . acetaminophen (TYLENOL) 325 MG tablet Take 2 tablets (650 mg total) by mouth every 6 (six) hours as needed for mild pain, moderate pain or headache (or Fever >/= 101). 40 tablet 0  . allopurinol (ZYLOPRIM) 300 MG tablet Take 1 tablet (300 mg total) by mouth daily. 90 tablet 3  . aspirin 81 MG tablet Take 1 tablet (81 mg total) by mouth daily. 90 tablet 2  . colestipol (COLESTID) 1 g tablet Take 5 g by mouth daily.    Marland Kitchen donepezil (ARICEPT) 10 MG tablet Take 1 tablet (10 mg total) by mouth daily with breakfast. 90 tablet 3  . doxazosin (CARDURA) 2 MG tablet Take 1 tablet (2 mg total) by mouth daily. 30 tablet 5  . famotidine (PEPCID) 40 MG tablet Take 1 tablet (40 mg total) by mouth at bedtime. (Patient  taking differently: Take 40 mg by mouth daily. ) 30 tablet 1  . Ferrous Sulfate 220 (44 Fe) MG/5ML LIQD Take 5 mLs by mouth 2 (two) times daily. 473 mL 11  . memantine (NAMENDA) 10 MG tablet Take 1 tablet (10 mg total) by mouth 2 (two) times daily. (Patient taking differently: Take 10 mg by mouth daily. ) 180 tablet 3  . mirtazapine (REMERON SOL-TAB) 15 MG disintegrating tablet DISSOLVE 1 TABLET BY MOUTH AT BEDTIME 30 tablet 3  . Nutritional Supplements (ENSURE NUTRITION SHAKE) LIQD Take 1 Bottle by mouth 4 (four) times daily. 120 Bottle 11  . pantoprazole (PROTONIX) 40 MG tablet Take 1 tablet (40 mg total) by mouth 2 (two) times daily. (Patient taking differently: Take 40 mg by mouth daily. ) 180 tablet 3  . traZODone (DESYREL) 50 MG tablet TAKE ONE-HALF TABLET BY MOUTH DAILY AT BEDTIME 15 tablet 3   No current facility-administered medications on file prior to visit.     Allergies  Allergen Reactions  . Amlodipine-Atorvastatin Other (See Comments)    REACTION: LEG CRAMPS  . Benazepril Hcl Other (See Comments)    REACTION: Cough  . Ezetimibe Other (See Comments)    REACTION: edema    Family History  Problem Relation Age of Onset  .  Cancer Father        Prostate Cancer  . Colon cancer Neg Hx   . Esophageal cancer Neg Hx   . Stomach cancer Neg Hx     BP (!) 170/98   Pulse 77   Wt 152 lb 9.6 oz (69.2 kg)   SpO2 94%   BMI 22.54 kg/m    Review of Systems Leg swelling is worse.  No cramping of the left hand.      Objective:   Physical Exam  VITAL SIGNS:  See vs page GENERAL: no distress Right hand no swell/tend/erythema.  Full rom of joints, without pain.  Normal temp.  No rash CV: 1+ bilat leg edema.     Assessment & Plan:  Hand sxs, new, uncertain etiology fe-def anemia: due for recheck Edema: worse  Patient Instructions  blood tests are requested for you today.  We'll let you know about the results. I have sent a prescription to your pharmacy, to resume the  fluid pill.  Please let us know if you want to see a hand specialist.   Please come back for a follow-up appointment in 1 month.   Please consider these measures for your health:  minimize alcohol.  Do not use tobacco products.  Have a colonoscopy at least every 10 years from age 74.  Keep firearms safely stored.  Always use seat belts.  have working smoke alarms in your home.  See an eye doctor and dentist regularly.  Never drive under the influence of alcohol or drugs (including prescription drugs).   It is critically important to prevent falling down (keep floor areas well-lit, dry, and free of loose objects.  If you have a cane, walker, or wheelchair, you should use it, even for short trips around the house.  Wear flat-soled shoes.  Also, try not to rush).       Subjective:   Patient here for Medicare annual wellness visit and management of other chronic and acute problems.     Risk factors: advanced age    Roster of Physicians Providing Medical Care to Patient:  See "snapshot"   Activities of Daily Living: In your present state of health, do you have any difficulty performing the following activities (lives with dtr)?:  Preparing food and eating?: yes  Bathing yourself: yes  Getting dressed: yes Using the toilet: yes Moving around from place to place: No  In the past year have you fallen or had a near fall?: No    Home Safety: Has smoke detector and wears seat belts.  No firearms.   Opioid Use: none  Diet and Exercise  Current exercise habits: limited by health probs. Dietary issues discussed: pt has a healthy diet   Depression Screen  Q1: Over the past two weeks, have you felt down, depressed or hopeless? Better recently Q2: Over the past two weeks, have you felt little interest or pleasure in doing things? Better recently.   The following portions of the patient's history were reviewed and updated as appropriate: allergies, current medications, past family history, past  medical history, past social history, past surgical history and problem list.   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  Hearing: grossly normal.  Body mass index:  See vs page Msk: pt slowly performs "get-up-and-go" from a sitting position Cognitive Impairment Assessment: cognition, memory and judgment are poor.  remembers 0/3 at 5 minutes.  poor recall.  Unable to read and write a sentence.  alert and oriented to self only   Assessment:   Medicare wellness utd on preventive parameters    Plan:   During the course of the visit the patient was educated and counseled about appropriate screening and preventive services including:        Fall prevention is advised today   Diabetes screening is done today Nutrition counseling is offered   Vaccines are updated as needed  Patient Instructions (the written plan) was given to the patient.

## 2017-03-29 LAB — PTH, INTACT AND CALCIUM
CALCIUM: 8.9 mg/dL (ref 8.6–10.3)
PTH: 76 pg/mL — ABNORMAL HIGH (ref 14–64)

## 2017-04-10 ENCOUNTER — Encounter: Payer: Self-pay | Admitting: Neurology

## 2017-04-10 ENCOUNTER — Ambulatory Visit (INDEPENDENT_AMBULATORY_CARE_PROVIDER_SITE_OTHER): Payer: Medicare Other | Admitting: Neurology

## 2017-04-10 VITALS — BP 178/98 | HR 73 | Wt 150.0 lb

## 2017-04-10 DIAGNOSIS — F03B0 Unspecified dementia, moderate, without behavioral disturbance, psychotic disturbance, mood disturbance, and anxiety: Secondary | ICD-10-CM

## 2017-04-10 DIAGNOSIS — F0390 Unspecified dementia without behavioral disturbance: Secondary | ICD-10-CM

## 2017-04-10 MED ORDER — MEMANTINE HCL 10 MG PO TABS: 10.0000 mg | ORAL_TABLET | Freq: Two times a day (BID) | ORAL | 3 refills | Status: DC

## 2017-04-10 MED ORDER — DONEPEZIL HCL 10 MG PO TABS: 10.0000 mg | ORAL_TABLET | Freq: Every day | ORAL | 3 refills | Status: DC

## 2017-04-10 NOTE — Patient Instructions (Signed)
1. Continue Aricept and Namenda 2. Continue 24/7 care 3. We will get you in contact with [facility] to help set you up with local resources for day programs 4. Follow-up in 1 year, call for any changes  FALL PRECAUTIONS: Be cautious when walking. Scan the area for obstacles that may increase the risk of trips and falls. When getting up in the mornings, sit up at the edge of the bed for a few minutes before getting out of bed. Consider elevating the bed at the head end to avoid drop of blood pressure when getting up. Walk always in a well-lit room (use night lights in the walls). Avoid area rugs or power cords from appliances in the middle of the walkways. Use a walker or a cane if necessary and consider physical therapy for balance exercise. Get your eyesight checked regularly.  FINANCIAL OVERSIGHT: Supervision, especially oversight when making financial decisions or transactions is also recommended.  HOME SAFETY: Consider the safety of the kitchen when operating appliances like stoves, microwave oven, and blender. Consider having supervision and share cooking responsibilities until no longer able to participate in those. Accidents with firearms and other hazards in the house should be identified and addressed as well.  DRIVING: Regarding driving, in patients with progressive memory problems, driving will be impaired. We advise to have someone else do the driving if trouble finding directions or if minor accidents are reported. Independent driving assessment is available to determine safety of driving.  ABILITY TO BE LEFT ALONE: If patient is unable to contact 911 operator, consider using [facility], or when the need is there, arrange for someone to stay with patients. Smoking is a fire hazard, consider supervision or cessation. Risk of wandering should be assessed by caregiver and if detected at any point, supervision and safe proof recommendations should be instituted.  MEDICATION SUPERVISION:  Inability to self-administer medication needs to be constantly addressed. Implement a mechanism to ensure safe administration of the medications.  RECOMMENDATIONS FOR ALL PATIENTS WITH MEMORY PROBLEMS: 1. Continue to exercise (Recommend 30 minutes of walking everyday, or 3 hours every week) 2. Increase social interactions - continue going to [place] and enjoy social gatherings with friends and family 3. Eat healthy, avoid fried foods and eat more fruits and vegetables 4. Maintain adequate blood pressure, blood sugar, and blood cholesterol level. Reducing the risk of stroke and cardiovascular disease also helps promoting better memory. 5. Avoid stressful situations. Live a simple life and avoid aggravations. Organize your time and prepare for the next day in anticipation. 6. Sleep well, avoid any interruptions of sleep and avoid any distractions in the bedroom that may interfere with adequate sleep quality 7. Avoid sugar, avoid sweets as there is a strong link between excessive sugar intake, diabetes, and cognitive impairment We discussed the Mediterranean diet, which has been shown to help patients reduce the risk of progressive memory disorders and reduces cardiovascular risk. This includes eating fish, eat fruits and green leafy vegetables, nuts like almonds and hazelnuts, walnuts, and also use olive oil. Avoid fast foods and fried foods as much as possible. Avoid sweets and sugar as sugar use has been linked to worsening of memory function.  There is always a concern of gradual progression of memory problems. If this is the case, then we may need to adjust level of care according to patient needs. Support, both to the patient and caregiver, should then be put into place.
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
## 2017-04-20 ENCOUNTER — Ambulatory Visit (HOSPITAL_COMMUNITY): Payer: Self-pay | Admitting: Psychiatry

## 2017-04-26 ENCOUNTER — Ambulatory Visit (HOSPITAL_COMMUNITY): Payer: Self-pay | Admitting: Psychiatry

## 2017-04-30 ENCOUNTER — Ambulatory Visit: Payer: Medicare Other | Admitting: Endocrinology

## 2017-04-30 ENCOUNTER — Encounter: Payer: Self-pay | Admitting: Endocrinology

## 2017-04-30 VITALS — BP 124/88 | HR 65 | Wt 150.6 lb

## 2017-04-30 DIAGNOSIS — I10 Essential (primary) hypertension: Secondary | ICD-10-CM

## 2017-04-30 MED ORDER — DOXAZOSIN MESYLATE 4 MG PO TABS: 4.0000 mg | ORAL_TABLET | Freq: Every day | ORAL | 11 refills | Status: DC

## 2017-04-30 NOTE — Progress Notes (Signed)
Subjective:    Patient ID: Anthony [name], male    DOB: Apr 12, 1936, 81 y.o.   MRN: [id number]  HPI Pt returns for f/u of edema: dtr says this is improved.  he has increased cardura to 4 mg qd, for HTN.  Past Medical History:  Diagnosis Date  . ABNORMAL ELECTROCARDIOGRAM 05/03/2010  . Alzheimer disease   . DIABETES MELLITUS, TYPE II 03/15/2007  . GALLSTONES 03/15/2007  . GOUT 03/15/2007  . HYPERLIPIDEMIA 03/15/2007  . HYPERTENSION 03/15/2007  . NEPHROPATHY, DIABETIC 03/15/2007  . RENAL INSUFFICIENCY 04/30/2009  . SHOULDER PAIN, BILATERAL 04/29/2008  . UROLITHIASIS, HX OF 03/15/2007    Past Surgical History:  Procedure Laterality Date  . NASAL SEPTUM SURGERY  1978    Social History   Socioeconomic History  . Marital status: Divorced    Spouse name: Not on file  . Number of children: Not on file  . Years of education: Not on file  . Highest education level: Not on file  Social Needs  . Financial resource strain: Not on file  . Food insecurity - worry: Not on file  . Food insecurity - inability: Not on file  . Transportation needs - medical: Not on file  . Transportation needs - non-medical: Not on file  Occupational History  . Occupation: Retired  Tobacco Use  . Smoking status: Never Smoker  . Smokeless tobacco: Never Used  Substance and Sexual Activity  . Alcohol use: No    Alcohol/week: 0.0 oz  . Drug use: No  . Sexual activity: No  Other Topics Concern  . Not on file  Social History Narrative   NOK is Daughter Sharol GivenAngelia Frogge          Current Outpatient Medications on File Prior to Visit  Medication Sig Dispense Refill  . acetaminophen (TYLENOL) 325 MG tablet Take 2 tablets (650 mg total) by mouth every 6 (six) hours as needed for mild pain, moderate pain or headache (or Fever >/= 101). 40 tablet 0  . allopurinol (ZYLOPRIM) 300 MG tablet Take 1 tablet (300 mg total) by mouth daily. 90 tablet 3  . aspirin 81 MG tablet Take 1 tablet (81 mg total) by mouth  daily. 90 tablet 2  . colestipol (COLESTID) 1 g tablet Take 5 g by mouth daily.    Marland Kitchen. donepezil (ARICEPT) 10 MG tablet Take 1 tablet (10 mg total) by mouth daily with breakfast. 90 tablet 3  . famotidine (PEPCID) 40 MG tablet Take 1 tablet (40 mg total) by mouth at bedtime. (Patient taking differently: Take 40 mg by mouth daily. ) 30 tablet 1  . furosemide (LASIX) 20 MG tablet Take 1 tablet (20 mg total) by mouth daily. 90 tablet 3  . memantine (NAMENDA) 10 MG tablet Take 1 tablet (10 mg total) by mouth 2 (two) times daily. 180 tablet 3  . mirtazapine (REMERON SOL-TAB) 15 MG disintegrating tablet DISSOLVE 1 TABLET BY MOUTH AT BEDTIME 30 tablet 3  . Nutritional Supplements (ENSURE NUTRITION SHAKE) LIQD Take 1 Bottle by mouth 4 (four) times daily. 120 Bottle 11  . pantoprazole (PROTONIX) 40 MG tablet Take 1 tablet (40 mg total) by mouth 2 (two) times daily. (Patient taking differently: Take 40 mg by mouth daily. ) 180 tablet 3  . traZODone (DESYREL) 50 MG tablet TAKE ONE-HALF TABLET BY MOUTH DAILY AT BEDTIME 15 tablet 3   No current facility-administered medications on file prior to visit.     Allergies  Allergen Reactions  . Amlodipine-Atorvastatin Other (  See Comments)    REACTION: LEG CRAMPS  . Benazepril Hcl Other (See Comments)    REACTION: Cough  . Ezetimibe Other (See Comments)    REACTION: edema    Family History  Problem Relation Age of Onset  . Cancer Father        Prostate Cancer  . Colon cancer Neg Hx   . Esophageal cancer Neg Hx   . Stomach cancer Neg Hx     BP 124/88 (BP Location: Left Arm, Patient Position: Sitting, Cuff Size: Normal)   Pulse 65   Wt 150 lb 9.6 oz (68.3 kg)   SpO2 97%   BMI 22.24 kg/m    Review of Systems Denies sob.  No signif weight change.     Objective:   Physical Exam VITAL SIGNS:  See vs page GENERAL: no distress Ext: trace bilat leg edema.   Gait: slow but steady.      Assessment & Plan:  HTN: well-controlled Edema:  improved  Patient Instructions  Please continue the same medications. Please come back for a follow-up appointment in 3 months.

## 2017-04-30 NOTE — Patient Instructions (Addendum)
Please continue the same medications.  Please come back for a follow-up appointment in 3 months.  

## 2017-06-28 ENCOUNTER — Other Ambulatory Visit: Payer: Self-pay | Admitting: Endocrinology

## 2017-07-17 ENCOUNTER — Other Ambulatory Visit: Payer: Self-pay | Admitting: Endocrinology

## 2017-07-25 ENCOUNTER — Ambulatory Visit (INDEPENDENT_AMBULATORY_CARE_PROVIDER_SITE_OTHER): Payer: Medicare Other | Admitting: Psychiatry

## 2017-07-25 ENCOUNTER — Encounter (HOSPITAL_COMMUNITY): Payer: Self-pay | Admitting: Psychiatry

## 2017-07-25 VITALS — BP 142/80 | HR 58 | Ht 65.0 in | Wt 152.0 lb

## 2017-07-25 DIAGNOSIS — F329 Major depressive disorder, single episode, unspecified: Secondary | ICD-10-CM

## 2017-07-25 DIAGNOSIS — F0393 Unspecified dementia, unspecified severity, with mood disturbance: Secondary | ICD-10-CM

## 2017-07-25 DIAGNOSIS — F0390 Unspecified dementia without behavioral disturbance: Secondary | ICD-10-CM

## 2017-07-25 MED ORDER — MIRTAZAPINE 15 MG PO TBDP: ORAL_TABLET | ORAL | 4 refills | Status: DC

## 2017-07-25 NOTE — Progress Notes (Signed)
Psychiatric Initial Adult Assessment   Patient Identification: Anthony [name] MRN:  [id number] Date of Evaluation:  07/25/2017 Referral Source: Dr. Melford Aase Chief Complaint:   Visit Diagnosis: No diagnosis found.  History of Present Illness:    This patient is a12 year old African-American male who has end-stage dementia. April 2018 he was failing at home where he was not eating or drinking area is very confused. A great difficulty swallowing food. Eventually he was hospitalized 3 high-grade extensive luminal her rehabilitation very was treated with occupational therapist at some swallowing therapist. The patient was retrained to eat and swallow. He was also started on Remeron as an antidepressant which also increased his appetite. He functioning again. He was able to interact. He stayed there for 3 weeks then return home and is been home since. The patient shows no evidence of depression now or ever. Patient does not cry. At this time he is sleeping and eating well. He's inarticulate. He is alert but not loose. He is no overt evidence of psychosis. He is not vomiting. Generally he is calm and overall cooperative. He is compliant. He lives with his daughter Anthony [name]. Today he seen with his other daughter Anthony [name] in who is the power of attorney.the family is very pleased that the patient survive now is living with them. Their wishes for him to continue living with. Today when her some parameters that are common indicators of when he should be in a facility. At this time he demonstrates none of those parameters. He is not attempting to elope. He is not psychotic. He actually is eating fairly well. He's incontinent of urine but not really of stool. He seems comfortable. The patient takes all the medications are prescribed.  Associated Signs/Symptoms: Depression Symptoms:  weight loss, (Hypo) Manic Symptoms:   Anxiety Symptoms:   Psychotic Symptoms:   PTSD Symptoms:   Past Psychiatric History:  none  Previous Psychotropic Medications:   Substance Abuse History in the last 12 months:    Consequences of Substance Abuse:   Past Medical History:  Past Medical History:  Diagnosis Date  . ABNORMAL ELECTROCARDIOGRAM 05/03/2010  . Alzheimer disease   . DIABETES MELLITUS, TYPE II 03/15/2007  . GALLSTONES 03/15/2007  . GOUT 03/15/2007  . HYPERLIPIDEMIA 03/15/2007  . HYPERTENSION 03/15/2007  . NEPHROPATHY, DIABETIC 03/15/2007  . RENAL INSUFFICIENCY 04/30/2009  . SHOULDER PAIN, BILATERAL 04/29/2008  . UROLITHIASIS, HX OF 03/15/2007    Past Surgical History:  Procedure Laterality Date  . NASAL SEPTUM SURGERY  1978    Family Psychiatric History:   Family History:  Family History  Problem Relation Age of Onset  . Cancer Father        Prostate Cancer  . Colon cancer Neg Hx   . Esophageal cancer Neg Hx   . Stomach cancer Neg Hx     Social History:   Social History   Socioeconomic History  . Marital status: Divorced    Spouse name: None  . Number of children: None  . Years of education: None  . Highest education level: None  Social Needs  . Financial resource strain: None  . Food insecurity - worry: None  . Food insecurity - inability: None  . Transportation needs - medical: None  . Transportation needs - non-medical: None  Occupational History  . Occupation: Retired  Tobacco Use  . Smoking status: Never Smoker  . Smokeless tobacco: Never Used  Substance and Sexual Activity  . Alcohol use: No    Alcohol/week: 0.0 oz  .  Drug use: No  . Sexual activity: No  Other Topics Concern  . 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 Amlodipine-Atorvastatin Other (See Comments)    REACTION: LEG CRAMPS  . Benazepril Hcl Other (See Comments)    REACTION: Cough  . Ezetimibe Other (See Comments)    REACTION: edema    Metabolic Disorder Labs: Lab Results   Component Value Date   HGBA1C 5.1 09/11/2016   MPG 114 09/02/2013   No results found for: PROLACTIN Lab Results  Component Value Date   CHOL 160 09/11/2016   TRIG 112.0 09/11/2016   HDL 35.10 (L) 09/11/2016   CHOLHDL 5 09/11/2016   VLDL 22.4 09/11/2016   LDLCALC 102 (H) 09/11/2016   LDLCALC 94 09/10/2015     Current Medications: Current Outpatient Medications  Medication Sig Dispense Refill  . acetaminophen (TYLENOL) 325 MG tablet Take 2 tablets (650 mg total) by mouth every 6 (six) hours as needed for mild pain, moderate pain or headache (or Fever >/= 101). 40 tablet 0  . allopurinol (ZYLOPRIM) 300 MG tablet TAKE ONE TABLET BY MOUTH ONCE DAILY 90 tablet 3  . aspirin 81 MG tablet Take 1 tablet (81 mg total) by mouth daily. 90 tablet 2  . donepezil (ARICEPT) 10 MG tablet Take 1 tablet (10 mg total) by mouth daily with breakfast. 90 tablet 3  . doxazosin (CARDURA) 4 MG tablet Take 1 tablet (4 mg total) daily by mouth. 30 tablet 11  . famotidine (PEPCID) 40 MG tablet Take 1 tablet (40 mg total) by mouth at bedtime. (Patient taking differently: Take 40 mg by mouth daily. ) 30 tablet 1  . furosemide (LASIX) 20 MG tablet Take 1 tablet (20 mg total) by mouth daily. 90 tablet 3  . memantine (NAMENDA) 10 MG tablet Take 1 tablet (10 mg total) by mouth 2 (two) times daily. 180 tablet 3  . mirtazapine (REMERON SOL-TAB) 15 MG disintegrating tablet DISSOLVE 1 TABLET BY MOUTH AT BEDTIME 30 tablet 4  . Nutritional Supplements (ENSURE NUTRITION SHAKE) LIQD Take 1 Bottle by mouth 4 (four) times daily. 120 Bottle 11  . pantoprazole (PROTONIX) 40 MG tablet TAKE ONE TABLET BY MOUTH TWICE DAILY 180 tablet 3  . traZODone (DESYREL) 50 MG tablet TAKE 1/2 (ONE-HALF) TABLET BY MOUTH AT BEDTIME 15 tablet 3   No current facility-administered medications for this visit.     Neurologic: Headache: No Seizure: No Paresthesias:No  Musculoskeletal: Strength & Muscle Tone: decreased Gait & Station:  unsteady Patient leans: Backward and N/A  Psychiatric Specialty Exam: ROS  Blood pressure (!) 142/80, pulse (!) 58, height 5\' 5"  (1.651 m), weight 152 lb (68.9 kg).Body mass index is 25.29 kg/m.  General Appearance: Casual  Eye Contact:  Fair  Speech:  Slurred  Volume:  Decreased  Mood:  Euthymic and Hopeless  Affect:  Congruent  Thought Process:  Disorganized  Orientation:  Negative  Thought Content:  WDL  Suicidal Thoughts:  No  Homicidal Thoughts:  No  Memory: absent short median long-term memory  Judgement:  Impaired  Insight:  Lacking  Psychomotor Activity:  Decreased  Concentration:    Recall:  Poor  Fund of Knowledge:Poor  Language: Poor  Akathisia:  No  Handed:  Right  AIMS (if indicated):    Assets:  Desire for Improvement  ADL's:  Impaired  Cognition:  Impaired,  Severe  Sleep:  Normal     Treatment Plan Summary: At this time this patient demonstrates severe dementia. He's having trouble recognizing his daughter. He does seem to be oriented to know when these home. Patient does not appear depressed or anxious. I suspect when he was hospitalized and went to the rehabilitation center his mood was probably significantly affected.I suspect the Remeron helped his appetite as well as and with a lot of attention and rehydration he ends up doing better. I do not think he has any behavioral disturbances. He is no evidence of being violent. I shared with the family that the best vein for an individual of this end-stage isn't possible to be at home. I recommended he consider going to support her also dementia consider getting a book called the 32 hour day. At this time we'll continue giving him his Remeron 15 mg slow in his neurologist continue on Aricept and Namenda. This patient is safe. Is not dangerous to himself or others. He's living in a very supportive environment. There also given the name of some other support groups and home health care assistance to take care of him  while he lives at home. Urinary has a power of attorney and I suspect a will. Today we also given the name [facility] which offers excellent daycare. This patient to return to see me in 4 months.   Gypsy BalsamGerald I Montrae Braithwaite, MD 2/6/20194:17 PM

## 2017-07-31 ENCOUNTER — Ambulatory Visit: Payer: Self-pay | Admitting: Endocrinology

## 2017-08-20 ENCOUNTER — Ambulatory Visit: Payer: Medicare Other | Admitting: Endocrinology

## 2017-08-20 ENCOUNTER — Encounter: Payer: Self-pay | Admitting: Endocrinology

## 2017-08-20 VITALS — BP 124/72 | HR 83 | Temp 98.4°F | Ht 69.0 in | Wt 155.6 lb

## 2017-08-20 DIAGNOSIS — N183 Chronic kidney disease, stage 3 unspecified: Secondary | ICD-10-CM

## 2017-08-20 DIAGNOSIS — N179 Acute kidney failure, unspecified: Secondary | ICD-10-CM | POA: Diagnosis not present

## 2017-08-20 DIAGNOSIS — D631 Anemia in chronic kidney disease: Secondary | ICD-10-CM | POA: Diagnosis not present

## 2017-08-20 DIAGNOSIS — M79641 Pain in right hand: Secondary | ICD-10-CM | POA: Insufficient documentation

## 2017-08-20 LAB — CBC WITH DIFFERENTIAL/PLATELET
BASOS PCT: 0.8 % (ref 0.0–3.0)
Basophils Absolute: 0 10*3/uL (ref 0.0–0.1)
EOS ABS: 0.2 10*3/uL (ref 0.0–0.7)
EOS PCT: 3.3 % (ref 0.0–5.0)
HCT: 37.7 % — ABNORMAL LOW (ref 39.0–52.0)
HEMOGLOBIN: 11.7 g/dL — AB (ref 13.0–17.0)
LYMPHS PCT: 28.7 % (ref 12.0–46.0)
Lymphs Abs: 1.9 10*3/uL (ref 0.7–4.0)
MCHC: 31.2 g/dL (ref 30.0–36.0)
MCV: 82.7 fl (ref 78.0–100.0)
MONO ABS: 0.4 10*3/uL (ref 0.1–1.0)
Monocytes Relative: 6.1 % (ref 3.0–12.0)
Neutro Abs: 4 10*3/uL (ref 1.4–7.7)
Neutrophils Relative %: 61.1 % (ref 43.0–77.0)
Platelets: 123 10*3/uL — ABNORMAL LOW (ref 150.0–400.0)
RBC: 4.55 Mil/uL (ref 4.22–5.81)
RDW: 16.1 % — AB (ref 11.5–15.5)
WBC: 6.5 10*3/uL (ref 4.0–10.5)

## 2017-08-20 LAB — BASIC METABOLIC PANEL
BUN: 20 mg/dL (ref 6–23)
CO2: 31 meq/L (ref 19–32)
Calcium: 9.4 mg/dL (ref 8.4–10.5)
Chloride: 104 mEq/L (ref 96–112)
Creatinine, Ser: 1.89 mg/dL — ABNORMAL HIGH (ref 0.40–1.50)
GFR: 44.4 mL/min — ABNORMAL LOW (ref >60.00)
GLUCOSE: 94 mg/dL (ref 70–99)
POTASSIUM: 3.4 meq/L — AB (ref 3.5–5.1)
SODIUM: 141 meq/L (ref 135–145)

## 2017-08-20 LAB — IBC PANEL
Iron: 127 ug/dL (ref 42–165)
SATURATION RATIOS: 52.7 % — AB (ref 20.0–50.0)
Transferrin: 172 mg/dL — ABNORMAL LOW (ref 212.0–360.0)

## 2017-08-20 LAB — SEDIMENTATION RATE: SED RATE: 16 mm/h (ref 0–20)

## 2017-08-20 NOTE — Patient Instructions (Addendum)
Please continue the same medications. Please see a specialist, for your hand.  you will receive a phone call, about a day and time for an appointment. blood tests are requested for you today.  We'll let you know about the results. Please come back for a follow-up appointment in 2 months.

## 2017-08-20 NOTE — Progress Notes (Signed)
Subjective:    Patient ID: Anthony [name], male    DOB: [birth year], 82 y.o.   MRN: [id number]  HPI Hx is from dtr, due to dementia.  She says pt has a few mos of moderate swelling of the right hand, worse in the morning, and assoc chronic flexion of the fingers.   Past Medical History:  Diagnosis Date  . ABNORMAL ELECTROCARDIOGRAM 05/03/2010  . Alzheimer disease   . DIABETES MELLITUS, TYPE II 03/15/2007  . GALLSTONES 03/15/2007  . GOUT 03/15/2007  . HYPERLIPIDEMIA 03/15/2007  . HYPERTENSION 03/15/2007  . NEPHROPATHY, DIABETIC 03/15/2007  . RENAL INSUFFICIENCY 04/30/2009  . SHOULDER PAIN, BILATERAL 04/29/2008  . UROLITHIASIS, HX OF 03/15/2007    Past Surgical History:  Procedure Laterality Date  . NASAL SEPTUM SURGERY  1978    Social History   Socioeconomic History  . Marital status: Divorced    Spouse name: Not on file  . Number of children: Not on file  . Years of education: Not on file  . Highest education level: Not on file  Social Needs  . Financial resource strain: Not on file  . Food insecurity - worry: Not on file  . Food insecurity - inability: Not on file  . Transportation needs - medical: Not on file  . Transportation needs - non-medical: Not on file  Occupational History  . Occupation: Retired  Tobacco Use  . Smoking status: Never Smoker  . Smokeless tobacco: Never Used  Substance and Sexual Activity  . Alcohol use: No    Alcohol/week: 0.0 oz  . Drug use: No  . Sexual activity: No  Other Topics Concern  . Not on file  Social History Narrative   NOK is Daughter Sharol Given          Current Outpatient Medications on File Prior to Visit  Medication Sig Dispense Refill  . acetaminophen (TYLENOL) 325 MG tablet Take 2 tablets (650 mg total) by mouth every 6 (six) hours as needed for mild pain, moderate pain or headache (or Fever >/= 101). 40 tablet 0  . allopurinol (ZYLOPRIM) 300 MG tablet TAKE ONE TABLET BY MOUTH ONCE DAILY 90 tablet 3  . aspirin  81 MG tablet Take 1 tablet (81 mg total) by mouth daily. 90 tablet 2  . donepezil (ARICEPT) 10 MG tablet Take 1 tablet (10 mg total) by mouth daily with breakfast. 90 tablet 3  . doxazosin (CARDURA) 4 MG tablet Take 1 tablet (4 mg total) daily by mouth. 30 tablet 11  . famotidine (PEPCID) 40 MG tablet Take 1 tablet (40 mg total) by mouth at bedtime. (Patient taking differently: Take 40 mg by mouth daily. ) 30 tablet 1  . furosemide (LASIX) 20 MG tablet Take 1 tablet (20 mg total) by mouth daily. 90 tablet 3  . memantine (NAMENDA) 10 MG tablet Take 1 tablet (10 mg total) by mouth 2 (two) times daily. 180 tablet 3  . mirtazapine (REMERON SOL-TAB) 15 MG disintegrating tablet DISSOLVE 1 TABLET BY MOUTH AT BEDTIME 30 tablet 4  . Nutritional Supplements (ENSURE NUTRITION SHAKE) LIQD Take 1 Bottle by mouth 4 (four) times daily. 120 Bottle 11  . pantoprazole (PROTONIX) 40 MG tablet TAKE ONE TABLET BY MOUTH TWICE DAILY 180 tablet 3  . traZODone (DESYREL) 50 MG tablet TAKE 1/2 (ONE-HALF) TABLET BY MOUTH AT BEDTIME 15 tablet 3   No current facility-administered medications on file prior to visit.     Allergies  Allergen Reactions  . Amlodipine-Atorvastatin Other (See  Comments)    REACTION: LEG CRAMPS  . Benazepril Hcl Other (See Comments)    REACTION: Cough  . Ezetimibe Other (See Comments)    REACTION: edema    Family History  Problem Relation Age of Onset  . Cancer Father        Prostate Cancer  . Colon cancer Neg Hx   . Esophageal cancer Neg Hx   . Stomach cancer Neg Hx     BP 124/72   Pulse 83   Temp 98.4 F (36.9 C) (Oral)   Ht 5\' 9"  (1.753 m)   Wt 155 lb 9.6 oz (70.6 kg)   SpO2 99%   BMI 22.98 kg/m    Review of Systems Right hand is also painful.  Denies falls.      Objective:   Physical Exam VITAL SIGNS:  See vs page GENERAL: no distress Right hand is normal.      Assessment & Plan:  Hand sxs, new, uncertain etiology Anemia: due for recheck Renal insuff: due for  recheck.   Patient Instructions  Please continue the same medications. Please see a specialist, for your hand.  you will receive a phone call, about a day and time for an appointment. blood tests are requested for you today.  We'll let you know about the results. Please come back for a follow-up appointment in 2 months.

## 2017-08-21 ENCOUNTER — Encounter: Payer: Self-pay | Admitting: Endocrinology

## 2017-08-29 NOTE — Progress Notes (Signed)
Anthony [name] - 82 y.o. male MRN [id number]  Date of birth: Oct 07, 1935  SUBJECTIVE:  Including CC & ROS.  Chief Complaint  Patient presents with  . Right hand pain    Anthony [name] is a 82 y.o. male that is presenting with right hand pain. Pain has been ongoing for two months. Pain is constant sharp. His fingers lock up intermittently. They lock up at night and they have to force them open in the morning. He has been wearing arthritis gloves with some improvement.  He has not been taking anything for the pain. Denies any nodule felt in the palm. Has a history of alzheimer's. He needs help with his ADL's.     Review of Systems  Constitutional: Negative for fever.  HENT: Negative for congestion.   Respiratory: Negative for cough.   Cardiovascular: Negative for chest pain.  Gastrointestinal: Negative for abdominal pain.  Musculoskeletal: Negative for gait problem.  Skin: Negative for color change.  Allergic/Immunologic: Negative for immunocompromised state.  Neurological: Negative for weakness.  Hematological: Negative for adenopathy.  Psychiatric/Behavioral: Negative for agitation.    HISTORY: Past Medical, Surgical, Social, and Family History Reviewed & Updated per EMR.   Pertinent Historical Findings include:  Past Medical History:  Diagnosis Date  . ABNORMAL ELECTROCARDIOGRAM 05/03/2010  . Alzheimer disease   . DIABETES MELLITUS, TYPE II 03/15/2007  . GALLSTONES 03/15/2007  . GOUT 03/15/2007  . HYPERLIPIDEMIA 03/15/2007  . HYPERTENSION 03/15/2007  . NEPHROPATHY, DIABETIC 03/15/2007  . RENAL INSUFFICIENCY 04/30/2009  . SHOULDER PAIN, BILATERAL 04/29/2008  . UROLITHIASIS, HX OF 03/15/2007    Past Surgical History:  Procedure Laterality Date  . NASAL SEPTUM SURGERY  1978    Allergies  Allergen Reactions  . Amlodipine-Atorvastatin Other (See Comments)    REACTION: LEG CRAMPS  . Benazepril Hcl Other (See Comments)    REACTION: Cough  . Ezetimibe Other (See  Comments)    REACTION: edema    Family History  Problem Relation Age of Onset  . Cancer Father        Prostate Cancer  . Colon cancer Neg Hx   . Esophageal cancer Neg Hx   . Stomach cancer Neg Hx      Social History   Socioeconomic History  . Marital status: Divorced    Spouse name: Not on file  . Number of children: Not on file  . Years of education: Not on file  . Highest education level: Not on file  Social Needs  . Financial resource strain: Not on file  . Food insecurity - worry: Not on file  . Food insecurity - inability: Not on file  . Transportation needs - medical: Not on file  . Transportation needs - non-medical: Not on file  Occupational History  . Occupation: Retired  Tobacco Use  . Smoking status: Never Smoker  . Smokeless tobacco: Never Used  Substance and Sexual Activity  . Alcohol use: No    Alcohol/week: 0.0 oz  . Drug use: No  . Sexual activity: No  Other Topics Concern  . Not on file  Social History Narrative   NOK is Daughter Angelia Hirano           PHYSICAL EXAM:  VS: BP 138/76 (BP Location: Left Arm, Patient Position: Sitting, Cuff Size: Normal)   Pulse (!) 58   Temp 98.2 F (36.8 C) (Oral)   Ht 5\' 9"  (1.753 m)   Wt 157 lb (71.2 kg)   SpO2 99%   BMI 23.18  kg/m  Physical Exam Gen: NAD, alert, cooperative with exam,  ENT: normal lips, normal nasal mucosa,  Eye: normal EOM, normal conjunctiva and lids CV:  no edema, +2 pedal pulses   Resp: no accessory muscle use, non-labored,   Skin: no rashes, no areas of induration  Neuro: normal tone, normal sensation to touch Psych:  alert  MSK:  Right forearm/hand:  No nodule palpated on the hand or on the flexor tendons  Not able to reproduce locking Normal finger adduction adduction  Normal elbow ROM  No atrophy observed  Slight flexion of the finger passively  Neurovascularly intact      ASSESSMENT & PLAN:   Right hand pain I don't appreciate any contractures on the  flexor surface of his hand. This may be associated with his Alzheimer's. I don't appreciate any nodules to  Suggest trigger finger - I will try placing a splint over the flexor surface and have him wear it at night. - if there is no improvement would consider injection therapy.

## 2017-08-30 ENCOUNTER — Ambulatory Visit: Payer: Medicare Other | Admitting: Family Medicine

## 2017-08-30 ENCOUNTER — Encounter: Payer: Self-pay | Admitting: Family Medicine

## 2017-08-30 VITALS — BP 138/76 | HR 58 | Temp 98.2°F | Ht 69.0 in | Wt 157.0 lb

## 2017-08-30 DIAGNOSIS — M79641 Pain in right hand: Secondary | ICD-10-CM

## 2017-08-30 NOTE — Patient Instructions (Addendum)
We will call you when the splinting material is in and have you come in to have it done.   Dupuytren's Contracture Surgery, Care After Refer to this sheet in the next few weeks. These instructions provide you with information about caring for yourself after your procedure. Your health care provider may also give you more specific instructions. Your treatment has been planned according to current medical practices, but problems sometimes occur. Call your health care provider if you have any problems or questions after your procedure. What can I expect after the procedure? After the procedure, it is common to have:  Pain, swelling, and stiffness in your hand, especially near your incision.  Some clear fluid or blood leaking from your incision.  Follow these instructions at home: If you have a splint:  Do not put pressure on any part of the splint until it is fully hardened. This may take several hours.  Wear the splint as told by your health care provider. Remove it only as told by your health care provider.  Loosen the splint if your fingers tingle, become numb, or turn cold and blue.  Do not let your splint get wet if it is not waterproof.  Keep the splint clean. Bathing  Do not take baths, swim, or use a hot tub until your health care provider approves. Ask your health care provider if you can take showers. You may only be allowed to take sponge baths for bathing.  If your splint is not waterproof, cover it with a watertight covering when you take a bath or a shower.  Keep your bandage (dressing) dry until your health care provider says it can be removed. Incision care  Keep your incision area clean and dry.  Follow instructions from your health care provider about how to take care of your incision. Make sure you: ? Wash your hands with soap and water before you change your bandage (dressing). If soap and water are not available, use hand sanitizer. ? Change your dressing as told  by your health care provider. ? Leave stitches (sutures), skin glue, or adhesive strips in place. These skin closures may need to stay in place for 2 weeks or longer. If adhesive strip edges start to loosen and curl up, you may trim the loose edges. Do not remove adhesive strips completely unless your health care provider tells you to do that.  Check your incision area every day for signs of infection. Check for: ? More redness, swelling, or pain. ? More fluid or blood. ? Warmth. ? Pus or a bad smell. Managing pain, stiffness, and swelling  If directed, put ice on the affected area. ? Put ice in a plastic bag. ? Place a towel between your skin and the bag. ? Leave the ice on for 20 minutes, 2-3 times a day.  Move your fingers often to avoid stiffness and to lessen swelling.  Raise (elevate) your hand above the level of your heart while you are sitting or lying down. Driving  Do not drive or operate heavy machinery while taking prescription pain medicine.  Do not drive for 24 hours if you received a sedative.  Ask your health care provider when it is safe to drive if you have a splint on your hand. Activity  Return to your normal activities as told by your health care provider. Ask your health care provider what activities are safe for you.  Rest and protect your hand until it feels better.  Stretch and massage your  hand as told by your health care provider.  If physical therapy was prescribed, do exercises as told by your health care provider. General instructions  Take over-the-counter and prescription medicines only as told by your health care provider.  If you were prescribed an antibiotic medicine, take it as told by your health care provider. Do not stop taking the antibiotic even if you start to feel better.  Wear compression stockings as told by your health care provider. These stockings help to prevent blood clots and reduce swelling in your legs.  Keep all follow-up  visits as told by your health care provider. This is important. Contact a health care provider if:  You develop new symptoms, or your symptoms get worse.  You have pain that gets worse or does not get better with medicine.  You develop numbness or tingling in your hand.  You have more redness, swelling, or pain around your incision.  You have more fluid or blood coming from your incision.  Your incision feels warm to the touch.  You have pus or a bad smell coming from your incision.  You have a fever.  You have problems with your splint. Get help right away if:  You have severe pain.  Your fingers change color or become unusually cold. This information is not intended to replace advice given to you by your health care provider. Make sure you discuss any questions you have with your health care provider. Document Released: 04/02/2009 Document Revised: 07/20/2015 Document Reviewed: 02/16/2015 Elsevier Interactive Patient Education  2018 [employer]

## 2017-08-31 NOTE — Assessment & Plan Note (Signed)
I don't appreciate any contractures on the flexor surface of his hand. This may be associated with his Alzheimer's. I don't appreciate any nodules to  Suggest trigger finger - I will try placing a splint over the flexor surface and have him wear it at night. - if there is no improvement would consider injection therapy.

## 2017-10-14 IMAGING — RF DG ESOPHAGUS
4 series · 15 of 18 positions shown · non-contrast
Comparison: None.

CLINICAL DATA: Dysphagia.

EXAM:
ESOPHOGRAM/BARIUM SWALLOW
TECHNIQUE: Single contrast examination was performed using thin barium and a 13
mm barium tablet.
FLUOROSCOPY TIME:  Radiation Exposure Index (if provided by the
fluoroscopic device): 133 mGy

[Series 1: sequence · 2 acquisitions, 7 frames shown (1 of 2)]
[im 1/2]
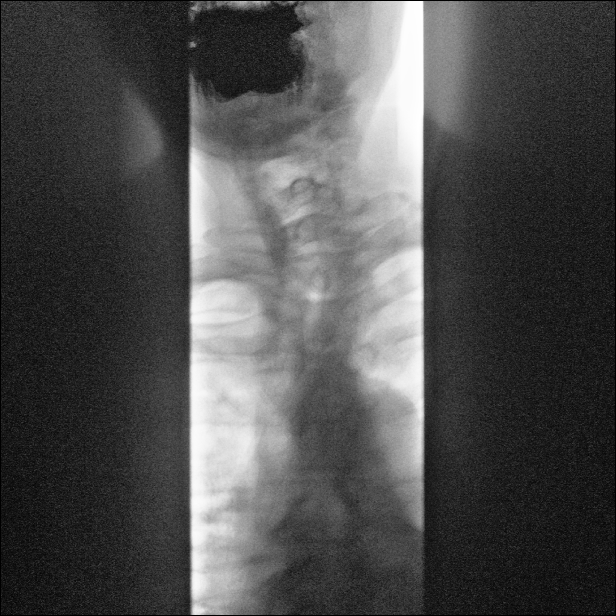
[im 1/2]
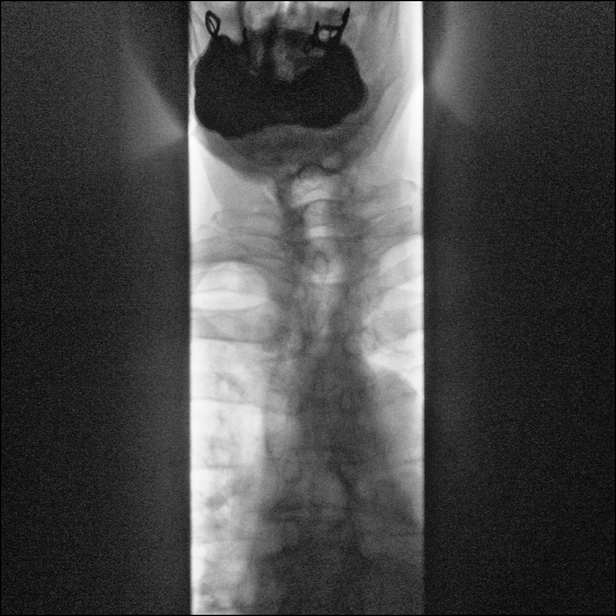
[im 1/2]
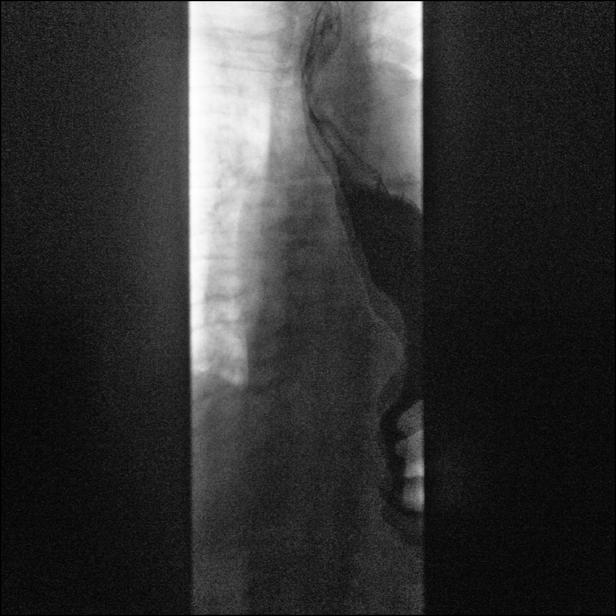
[im 2/2]
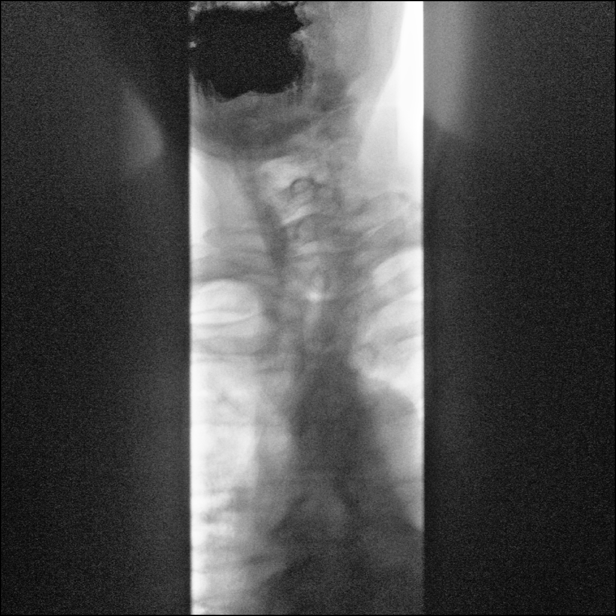
[im 2/2]
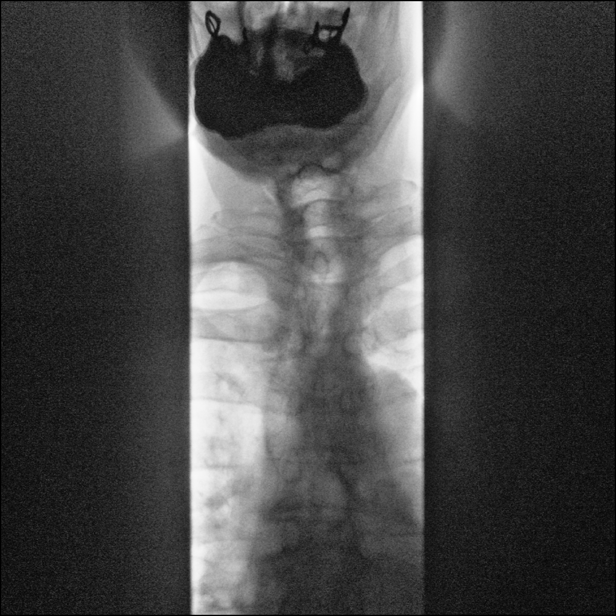
[im 2/2]
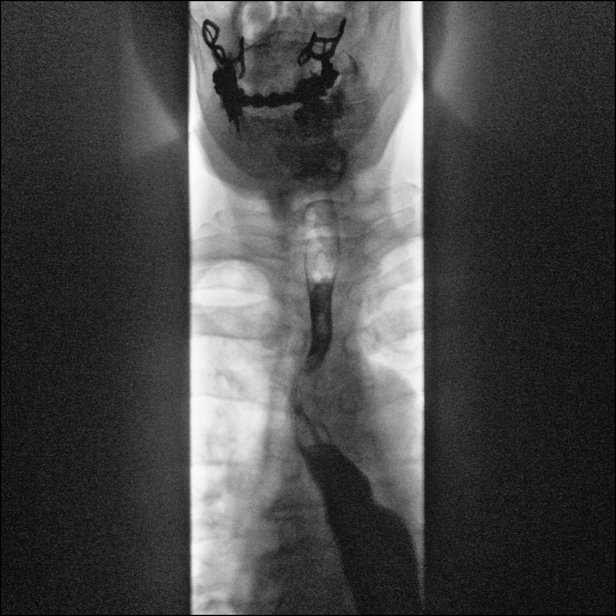
[im 2/2]
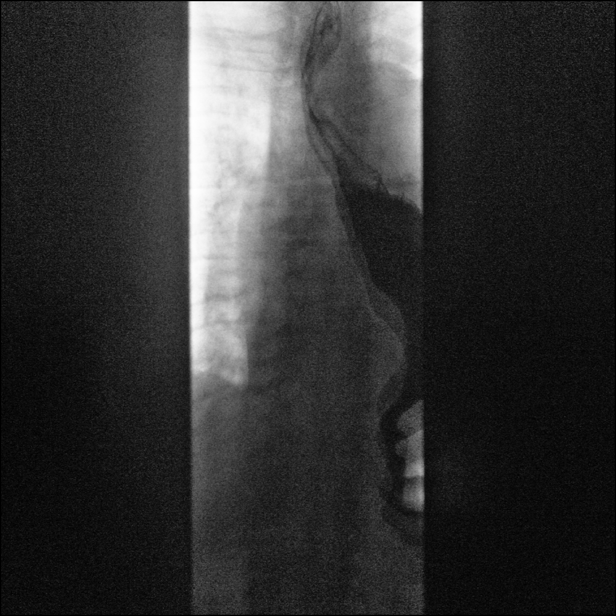

[Series 2: one shot · 1 of 2 slices shown (1 of 2)]
[im 2/2]
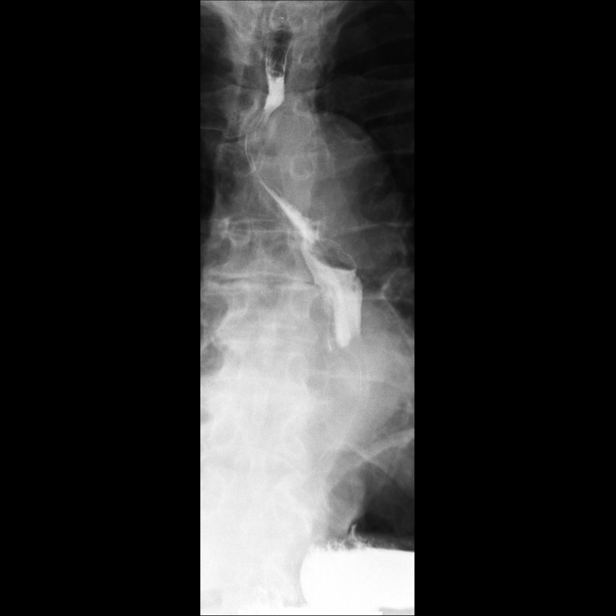

[Series 3: sequence · 4 of 100 frames shown (2 of 2)]
[frame 16/100]
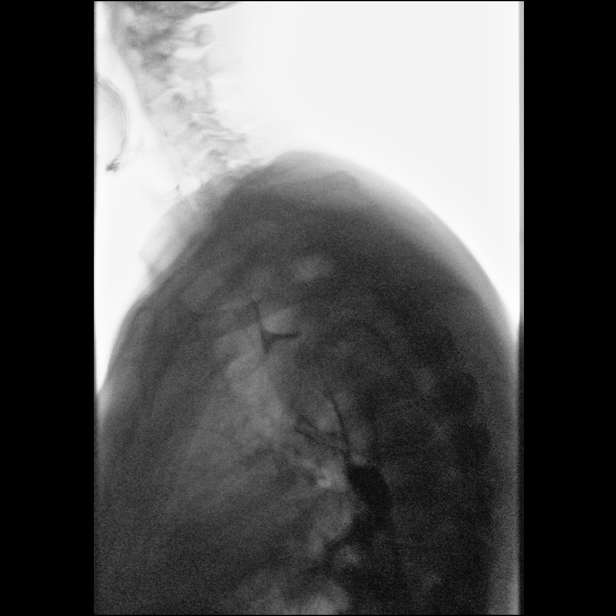
[frame 51/100]
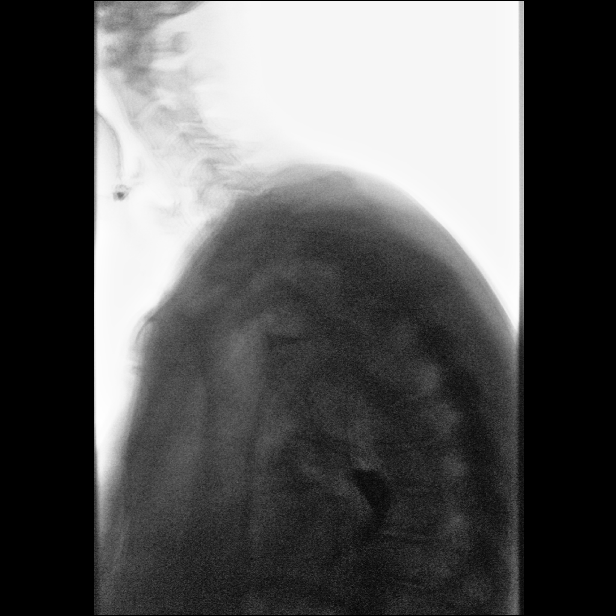
[frame 71/100]
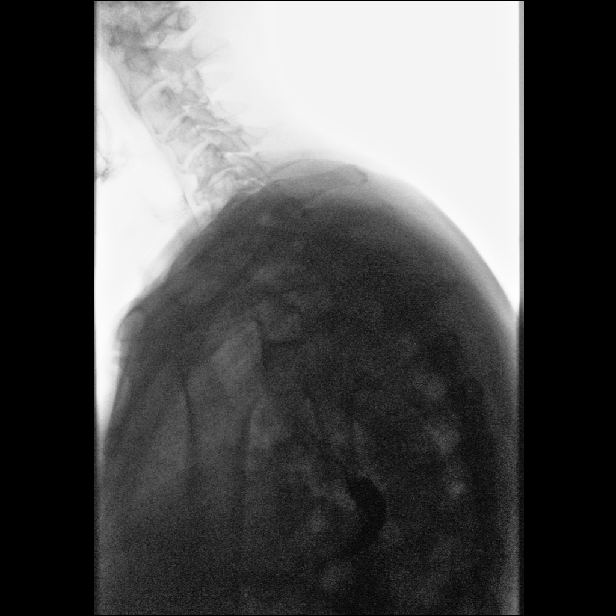
[frame 86/100]
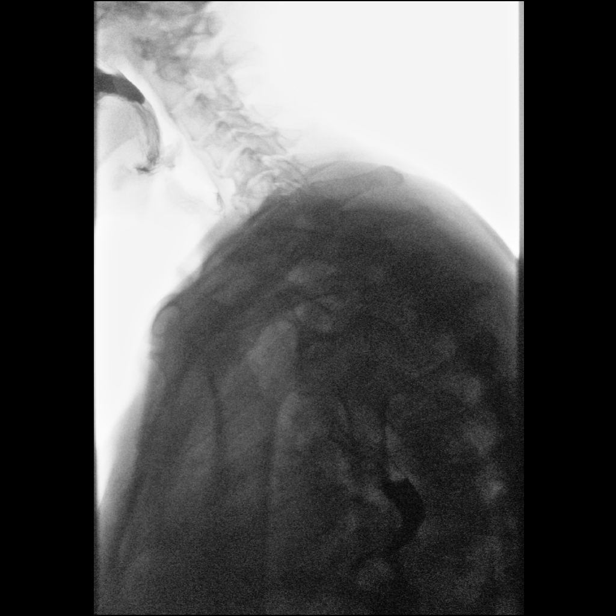

[Series 4: one shot · 3 of 4 slices shown (2 of 2)]
[im 2/4]
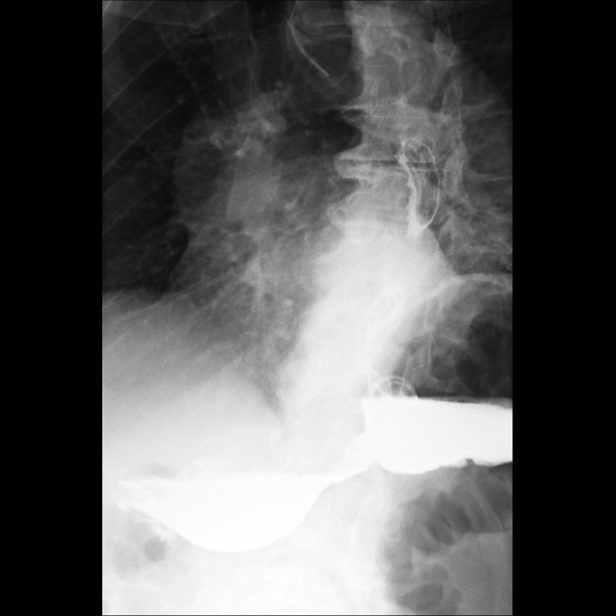
[im 3/4]
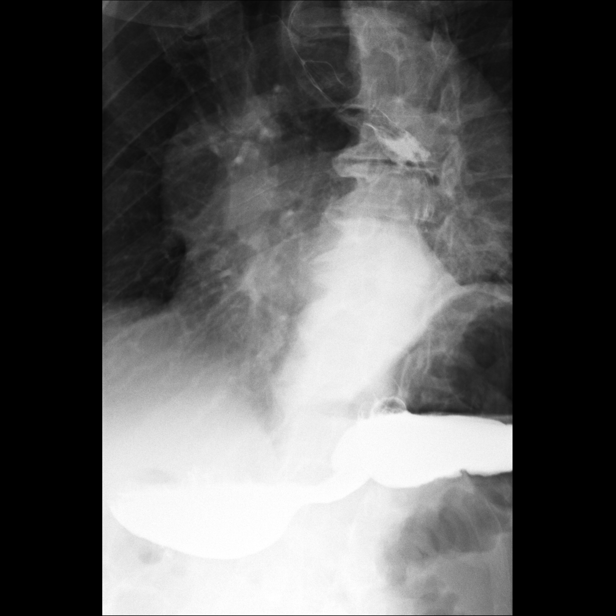
[im 4/4]
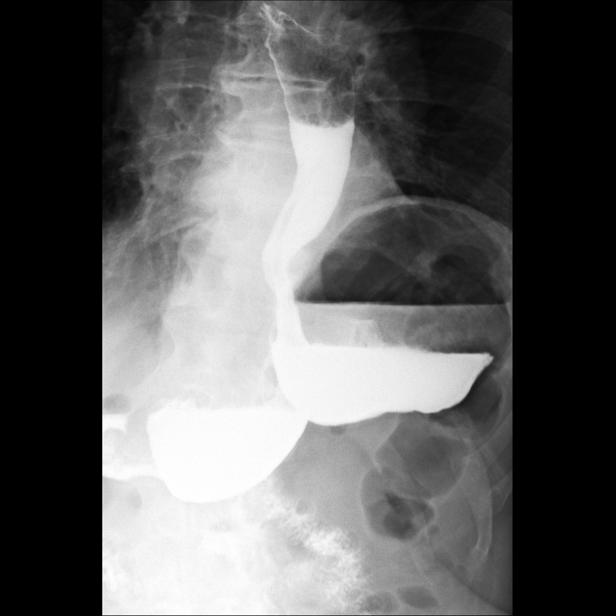

[15 of 18 positions shown; findings below may reference images not displayed]

FINDINGS: The patient had difficulty initiating the swallowing mechanism but
once initiated the oropharyngeal swallowing mechanisms are normal.
There is no aspiration or laryngeal penetration. The mucosa and
motility of the esophagus are normal except for a few tertiary
contractions distally.

The patient was not able to swallow the 13 mm barium tablet due to
oral stage issues.
IMPRESSION: Oral stage difficulty.  Otherwise, normal barium esophagram.

## 2017-10-19 ENCOUNTER — Ambulatory Visit (INDEPENDENT_AMBULATORY_CARE_PROVIDER_SITE_OTHER): Payer: Medicare Other | Admitting: Endocrinology

## 2017-10-19 ENCOUNTER — Encounter: Payer: Self-pay | Admitting: Endocrinology

## 2017-10-19 VITALS — BP 142/80 | HR 72 | Wt 157.6 lb

## 2017-10-19 DIAGNOSIS — Z0000 Encounter for general adult medical examination without abnormal findings: Secondary | ICD-10-CM | POA: Diagnosis not present

## 2017-10-19 NOTE — Progress Notes (Signed)
Subjective:    Patient ID: Anthony [name], male    DOB: 12-02-1935, 82 y.o.   MRN: [id number]  HPI Pt is here for regular wellness examination, and is feeling pretty well in general, and says chronic med probs are stable, except as noted below.  Past Medical History:  Diagnosis Date  . ABNORMAL ELECTROCARDIOGRAM 05/03/2010  . Alzheimer disease   . GALLSTONES 03/15/2007  . GOUT 03/15/2007  . HYPERLIPIDEMIA 03/15/2007  . HYPERTENSION 03/15/2007  . NEPHROPATHY, DIABETIC 03/15/2007  . RENAL INSUFFICIENCY 04/30/2009  . SHOULDER PAIN, BILATERAL 04/29/2008  . UROLITHIASIS, HX OF 03/15/2007    Past Surgical History:  Procedure Laterality Date  . NASAL SEPTUM SURGERY  1978    Social History   Socioeconomic History  . Marital status: Divorced    Spouse name: Not on file  . Number of children: Not on file  . Years of education: Not on file  . Highest education level: Not on file  Occupational History  . Occupation: Retired  Engineer, production  . Financial resource strain: Not on file  . Food insecurity:    Worry: Not on file    Inability: Not on file  . Transportation needs:    Medical: Not on file    Non-medical: Not on file  Tobacco Use  . Smoking status: Never Smoker  . Smokeless tobacco: Never Used  Substance and Sexual Activity  . Alcohol use: No    Alcohol/week: 0.0 oz  . Drug use: No  . Sexual activity: Never  Lifestyle  . Physical activity:    Days per week: Not on file    Minutes per session: Not on file  . Stress: Not on file  Relationships  . Social connections:    Talks on phone: Not on file    Gets together: Not on file    Attends religious service: Not on file    Active member of club or organization: Not on file    Attends meetings of clubs or organizations: Not on file    Relationship status: Not on file  . Intimate partner violence:    Fear of current or ex partner: Not on file    Emotionally abused: Not on file    Physically abused: Not on file   Forced sexual activity: Not on file  Other Topics Concern  . Not on file  Social History Narrative   NOK is Daughter Sharol Given          Current Outpatient Medications on File Prior to Visit  Medication Sig Dispense Refill  . acetaminophen (TYLENOL) 325 MG tablet Take 2 tablets (650 mg total) by mouth every 6 (six) hours as needed for mild pain, moderate pain or headache (or Fever >/= 101). 40 tablet 0  . allopurinol (ZYLOPRIM) 300 MG tablet TAKE ONE TABLET BY MOUTH ONCE DAILY 90 tablet 3  . aspirin 81 MG tablet Take 1 tablet (81 mg total) by mouth daily. 90 tablet 2  . donepezil (ARICEPT) 10 MG tablet Take 1 tablet (10 mg total) by mouth daily with breakfast. 90 tablet 3  . doxazosin (CARDURA) 4 MG tablet Take 1 tablet (4 mg total) daily by mouth. 30 tablet 11  . famotidine (PEPCID) 40 MG tablet Take 1 tablet (40 mg total) by mouth at bedtime. (Patient taking differently: Take 40 mg by mouth daily. ) 30 tablet 1  . furosemide (LASIX) 20 MG tablet Take 1 tablet (20 mg total) by mouth daily. 90 tablet 3  .  memantine (NAMENDA) 10 MG tablet Take 1 tablet (10 mg total) by mouth 2 (two) times daily. 180 tablet 3  . mirtazapine (REMERON SOL-TAB) 15 MG disintegrating tablet DISSOLVE 1 TABLET BY MOUTH AT BEDTIME 30 tablet 4  . Nutritional Supplements (ENSURE NUTRITION SHAKE) LIQD Take 1 Bottle by mouth 4 (four) times daily. 120 Bottle 11  . pantoprazole (PROTONIX) 40 MG tablet TAKE ONE TABLET BY MOUTH TWICE DAILY 180 tablet 3  . traZODone (DESYREL) 50 MG tablet TAKE 1/2 (ONE-HALF) TABLET BY MOUTH AT BEDTIME 15 tablet 3   No current facility-administered medications on file prior to visit.     Allergies  Allergen Reactions  . Amlodipine-Atorvastatin Other (See Comments)    REACTION: LEG CRAMPS  . Benazepril Hcl Other (See Comments)    REACTION: Cough  . Ezetimibe Other (See Comments)    REACTION: edema    Family History  Problem Relation Age of Onset  . Cancer Father         Prostate Cancer  . Colon cancer Neg Hx   . Esophageal cancer Neg Hx   . Stomach cancer Neg Hx     BP (!) 142/80   Pulse 72   Wt 157 lb 9.6 oz (71.5 kg)   SpO2 95%   BMI 23.27 kg/m     Review of Systems Denies fever, weight change, visual loss, hearing loss, chest pain, sob, back pain, cold intolerance, BRBPR, hematuria, syncope, numbness, easy bruising, and rash.  Depression is well-controlled.  He has rhinorrhea.       Objective:   Physical Exam VS: see vs page GEN: no distress HEAD: head: no deformity eyes: no periorbital swelling, no proptosis external nose and ears are normal mouth: no lesion seen NECK: supple, thyroid is not enlarged CHEST WALL: no deformity.  LUNGS: clear to auscultation, except for a few rales at the bases.   CV: reg rate and rhythm; soft systolic murmur.  ABD: abdomen is soft, nontender.  no hepatosplenomegaly.  not distended.  no hernia.  MUSCULOSKELETAL: muscle bulk and strength are grossly normal.  no obvious joint swelling.  gait is slow but steady.  EXTEMITIES: no deformity.  no ulcer on the feet.  feet are of normal color and temp.  no edema.  PULSES: dorsalis pedis intact bilat.  no carotid bruit.  NEURO:  cn 2-12 grossly intact.   readily moves all 4's.  sensation is intact to touch on the feet.  SKIN:  Normal texture and temperature.  No rash or suspicious lesion is visible.   NODES:  None palpable at the neck.  PSYCH: alert and cooperative.  oriented to self only.  Does not appear anxious nor depressed.        Assessment & Plan:  Wellness visit today, with problems stable, except as noted. Patient Instructions  Please consider these measures for your health:  minimize alcohol.  Do not use tobacco products.  Have a colonoscopy at least every 10 years from age 31.  Keep firearms safely stored.  Always use seat belts.  have working smoke alarms in your home.  See an eye doctor and dentist regularly.  Never drive under the influence of  alcohol or drugs (including prescription drugs).   Please continue the same medications.  Please come back for a follow-up appointment in 3-4 months.

## 2017-10-19 NOTE — Patient Instructions (Signed)
Please consider these measures for your health:  minimize alcohol.  Do not use tobacco products.  Have a colonoscopy at least every 10 years from age 80.  Keep firearms safely stored.  Always use seat belts.  have working smoke alarms in your home.  See an eye doctor and dentist regularly.  Never drive under the influence of alcohol or drugs (including prescription drugs).   Please continue the same medications.  Please come back for a follow-up appointment in 3-4 months.

## 2017-11-12 ENCOUNTER — Other Ambulatory Visit: Payer: Self-pay | Admitting: Endocrinology

## 2017-11-21 ENCOUNTER — Ambulatory Visit (INDEPENDENT_AMBULATORY_CARE_PROVIDER_SITE_OTHER): Payer: Medicare Other | Admitting: Psychiatry

## 2017-11-21 ENCOUNTER — Encounter (HOSPITAL_COMMUNITY): Payer: Self-pay | Admitting: Psychiatry

## 2017-11-21 VITALS — BP 160/100 | HR 60 | Ht 69.0 in | Wt 157.0 lb

## 2017-11-21 DIAGNOSIS — F0391 Unspecified dementia with behavioral disturbance: Secondary | ICD-10-CM

## 2017-11-21 MED ORDER — MIRTAZAPINE 15 MG PO TBDP: ORAL_TABLET | ORAL | 6 refills | Status: DC

## 2017-11-21 MED ORDER — MIRTAZAPINE 15 MG PO TBDP: ORAL_TABLET | ORAL | 8 refills | Status: DC

## 2017-11-21 NOTE — Progress Notes (Signed)
Psychiatric Initial Adult Assessment   Patient Identification: Anthony [name] MRN:  [id number] Date of Evaluation:  11/21/2017 Referral Source: Dr. Melford Aase Chief Complaint:   Visit Diagnosis: No diagnosis found.  History of Present Illness:    oday the patient is seen with his daughter Anthony [name]. The patient is doing well. He is stable. He describes being cooperative and compliant. His weight is stable. He eats with assistance. Most of his daily activities are done by cueing.The patient is not agitated all. Making no attempts to elope is not violent or aggressive. Patient is no complaints. He does not make his needs known. Sunday she talks of basis. He still seems to recognize his daughter who is his caregiver family is made a commitmentto take care of continuously and not to put him in a facility. The patient's familyhe is amazed at the benefits of Remeron. Since being on Remeron is eating well.is calm sleeping well.  Associated Signs/Symptoms: Depression Symptoms:  weight loss, (Hypo) Manic Symptoms:   Anxiety Symptoms:   Psychotic Symptoms:   PTSD Symptoms:   Past Psychiatric History: none  Previous Psychotropic Medications:   Substance Abuse History in the last 12 months:    Consequences of Substance Abuse:   Past Medical History:  Past Medical History:  Diagnosis Date  . ABNORMAL ELECTROCARDIOGRAM 05/03/2010  . Alzheimer disease   . GALLSTONES 03/15/2007  . GOUT 03/15/2007  . HYPERLIPIDEMIA 03/15/2007  . HYPERTENSION 03/15/2007  . NEPHROPATHY, DIABETIC 03/15/2007  . RENAL INSUFFICIENCY 04/30/2009  . SHOULDER PAIN, BILATERAL 04/29/2008  . UROLITHIASIS, HX OF 03/15/2007    Past Surgical History:  Procedure Laterality Date  . NASAL SEPTUM SURGERY  1978    Family Psychiatric History:   Family History:  Family History  Problem Relation Age of Onset  . Cancer Father        Prostate Cancer  . Colon cancer Neg Hx   . Esophageal cancer Neg Hx   . Stomach cancer Neg  Hx     Social History:   Social History   Socioeconomic History  . Marital status: Divorced    Spouse name: Not on file  . Number of children: Not on file  . Years of education: Not on file  . Highest education level: Not on file  Occupational History  . Occupation: Retired  Engineer, production  . Financial resource strain: Not on file  . Food insecurity:    Worry: Not on file    Inability: Not on file  . Transportation needs:    Medical: Not on file    Non-medical: Not on file  Tobacco Use  . Smoking status: Never Smoker  . Smokeless tobacco: Never Used  Substance and Sexual Activity  . Alcohol use: No    Alcohol/week: 0.0 oz  . Drug use: No  . Sexual activity: Never  Lifestyle  . Physical activity:    Days per week: Not on file    Minutes per session: Not on file  . Stress: Not on file  Relationships  . Social connections:    Talks on phone: Not on file    Gets together: Not on file    Attends religious service: Not on file    Active member of club or organization: Not on file    Attends meetings of clubs or organizations: Not on file    Relationship status: Not on file  Other Topics Concern  . Not on file  Social History Narrative   NOK is Daughter Anthony [name]  Additional Social History:   Allergies:   Allergies  Allergen Reactions  . Amlodipine-Atorvastatin Other (See Comments)    REACTION: LEG CRAMPS  . Benazepril Hcl Other (See Comments)    REACTION: Cough  . Ezetimibe Other (See Comments)    REACTION: edema    Metabolic Disorder Labs: Lab Results  Component Value Date   HGBA1C 5.1 09/11/2016   MPG 114 09/02/2013   No results found for: PROLACTIN Lab Results  Component Value Date   CHOL 160 09/11/2016   TRIG 112.0 09/11/2016   HDL 35.10 (L) 09/11/2016   CHOLHDL 5 09/11/2016   VLDL 22.4 09/11/2016   LDLCALC 102 (H) 09/11/2016   LDLCALC 94 09/10/2015     Current Medications: Current Outpatient Medications  Medication Sig  Dispense Refill  . acetaminophen (TYLENOL) 325 MG tablet Take 2 tablets (650 mg total) by mouth every 6 (six) hours as needed for mild pain, moderate pain or headache (or Fever >/= 101). 40 tablet 0  . allopurinol (ZYLOPRIM) 300 MG tablet TAKE ONE TABLET BY MOUTH ONCE DAILY 90 tablet 3  . aspirin 81 MG tablet Take 1 tablet (81 mg total) by mouth daily. 90 tablet 2  . donepezil (ARICEPT) 10 MG tablet Take 1 tablet (10 mg total) by mouth daily with breakfast. 90 tablet 3  . doxazosin (CARDURA) 4 MG tablet Take 1 tablet (4 mg total) daily by mouth. 30 tablet 11  . famotidine (PEPCID) 40 MG tablet Take 1 tablet (40 mg total) by mouth at bedtime. (Patient taking differently: Take 40 mg by mouth daily. ) 30 tablet 1  . furosemide (LASIX) 20 MG tablet Take 1 tablet (20 mg total) by mouth daily. 90 tablet 3  . memantine (NAMENDA) 10 MG tablet Take 1 tablet (10 mg total) by mouth 2 (two) times daily. 180 tablet 3  . mirtazapine (REMERON SOL-TAB) 15 MG disintegrating tablet DISSOLVE 1 TABLET BY MOUTH AT BEDTIME 30 tablet 8  . Nutritional Supplements (ENSURE NUTRITION SHAKE) LIQD Take 1 Bottle by mouth 4 (four) times daily. 120 Bottle 11  . pantoprazole (PROTONIX) 40 MG tablet TAKE ONE TABLET BY MOUTH TWICE DAILY 180 tablet 3  . traZODone (DESYREL) 50 MG tablet TAKE 1/2 (ONE-HALF) TABLET BY MOUTH AT BEDTIME 15 tablet 3   No current facility-administered medications for this visit.     Neurologic: Headache: No Seizure: No Paresthesias:No  Musculoskeletal: Strength & Muscle Tone: decreased Gait & Station: unsteady Patient leans: Backward and N/A  Psychiatric Specialty Exam: ROS  Blood pressure (!) 160/100, pulse 60, height 5\' 9"  (1.753 m), weight 157 lb (71.2 kg), SpO2 94 %.Body mass index is 23.18 kg/m.  General Appearance: Casual  Eye Contact:  Fair  Speech:  Slurred  Volume:  Decreased  Mood:  Euthymic and Hopeless  Affect:  Congruent  Thought Process:  Disorganized  Orientation:   Negative  Thought Content:  WDL  Suicidal Thoughts:  No  Homicidal Thoughts:  No  Memory: absent short median long-term memory  Judgement:  Impaired  Insight:  Lacking  Psychomotor Activity:  Decreased  Concentration:    Recall:  Poor  Fund of Knowledge:Poor  Language: Poor  Akathisia:  No  Handed:  Right  AIMS (if indicated):    Assets:  Desire for Improvement  ADL's:  Impaired  Cognition: Impaired,  Severe  Sleep:  Normal     Treatment Plan Summary: At this time the patient continue taking Remeron 15 mg. It made a decision that they like to continue  to come to see me and return to see me in 7 months. Their primary care doctor apparently is hesitant about giving him an antidepressant. I am not all. The patient is doing well.  Gypsy BalsamGerald I Neville Pauls, MD 6/5/20194:12 PM

## 2018-02-13 ENCOUNTER — Other Ambulatory Visit: Payer: Self-pay | Admitting: Endocrinology

## 2018-02-15 IMAGING — CR DG ABDOMEN ACUTE W/ 1V CHEST
4 series · 4 of 4 positions shown · non-contrast
Comparison: 04/11/2016

CLINICAL DATA: 72 y/o male with emesis and diarrhea x3-4 days. Pt's
family notes decreased appetite as well. No hx abd issues.

EXAM:
DG ABDOMEN ACUTE W/ 1V CHEST

[w abdomen decub]
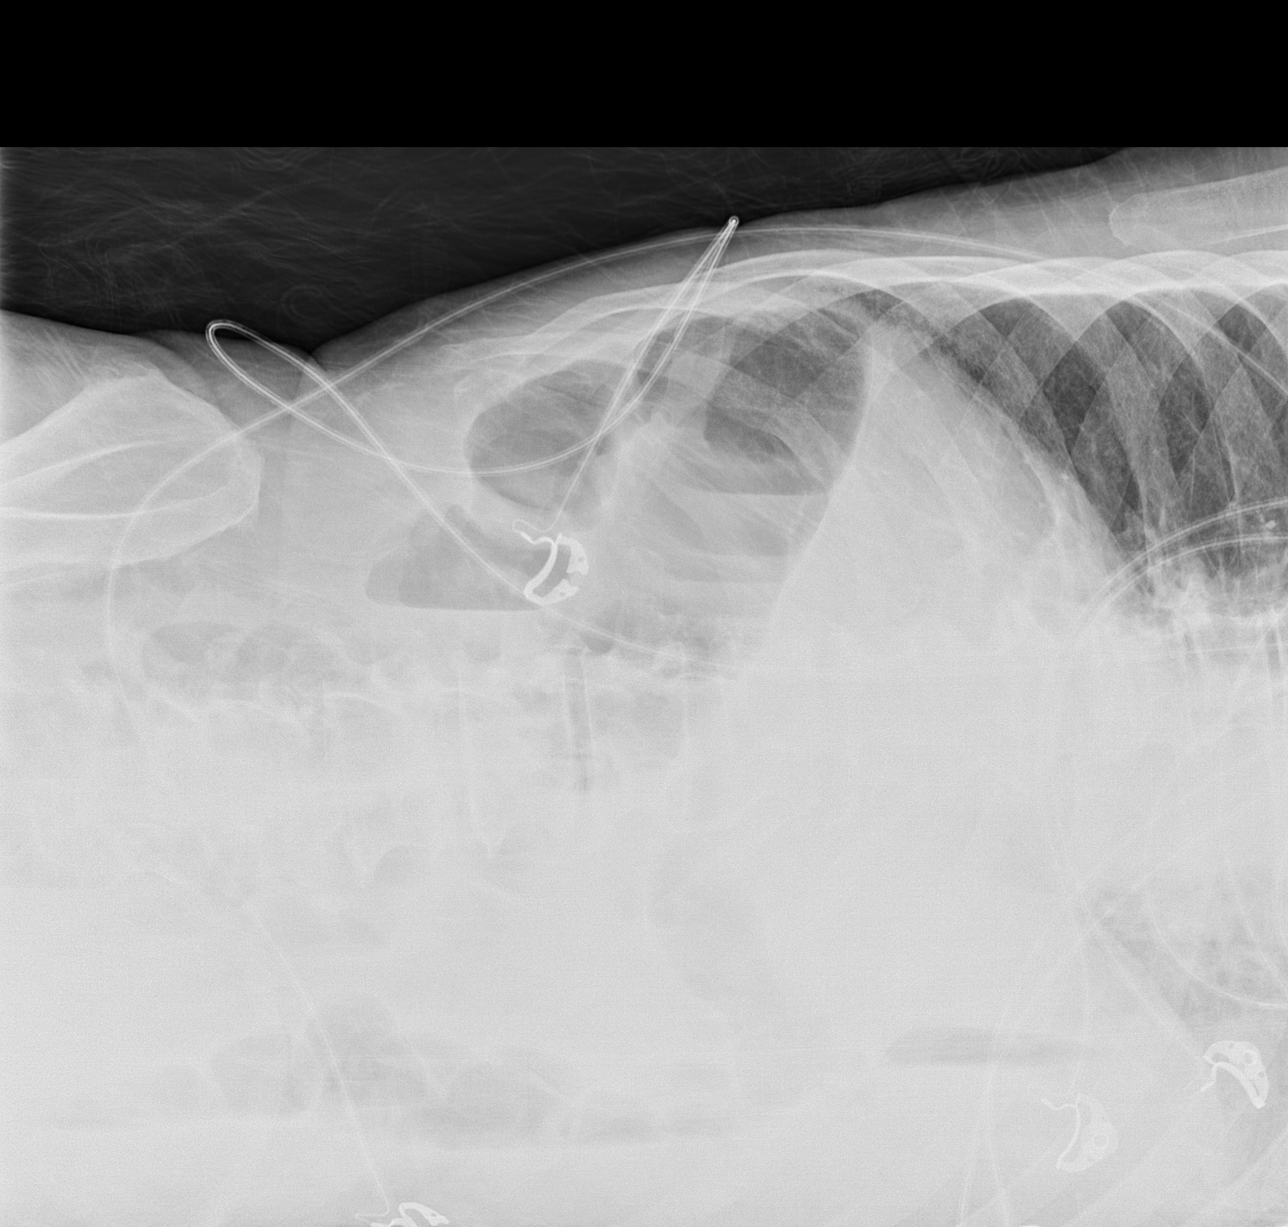

[x chest ap]
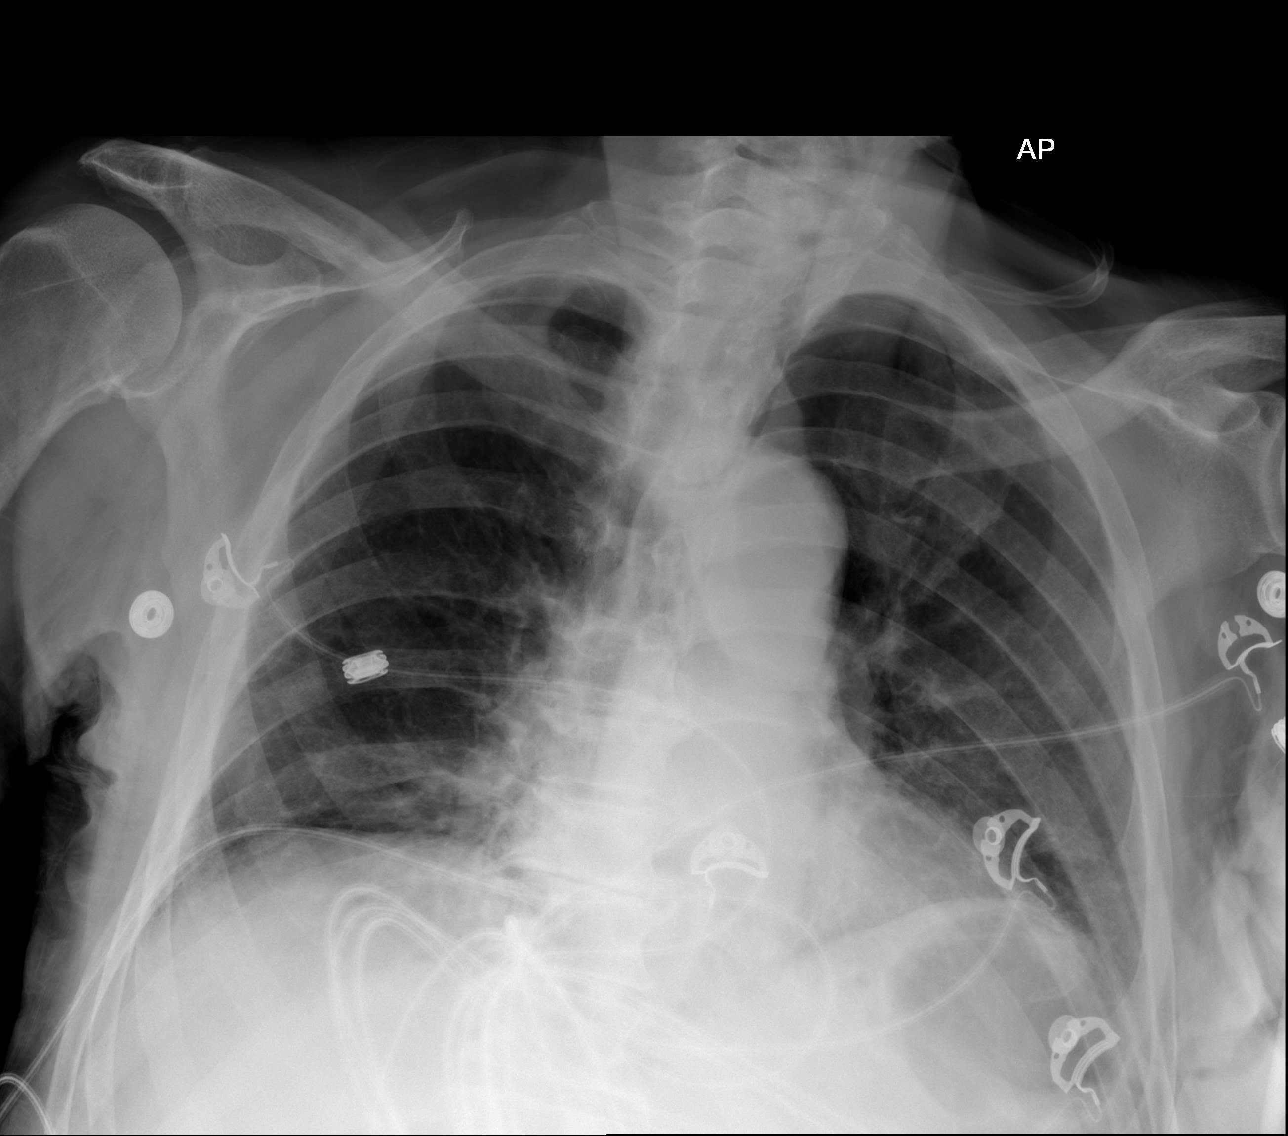

[x abdomen supine (1 of 2)]
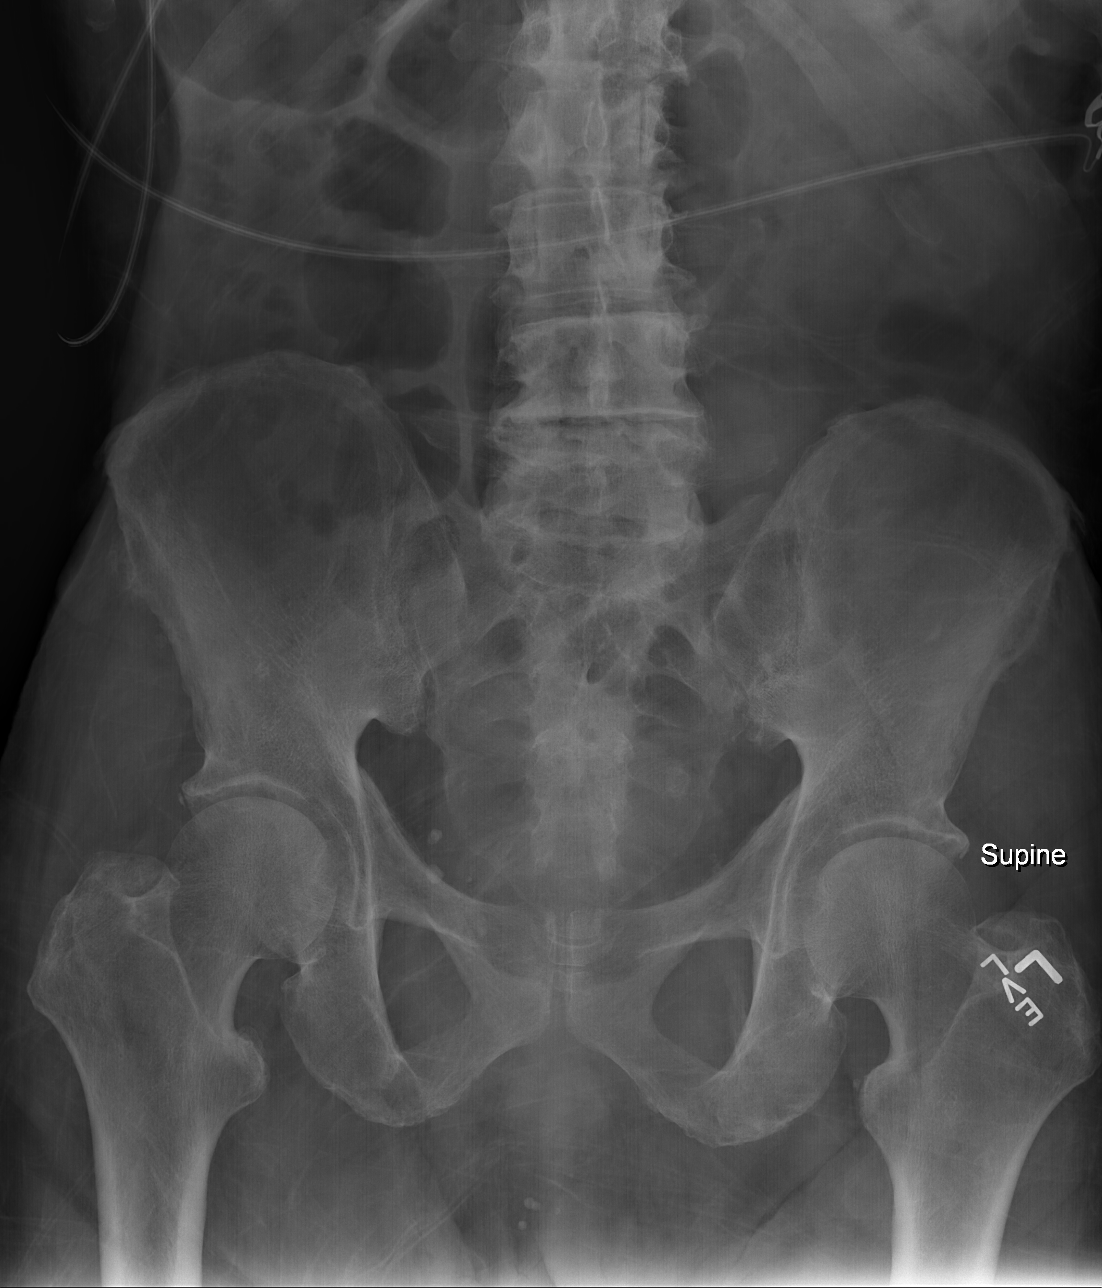

[x abdomen supine (2 of 2)]
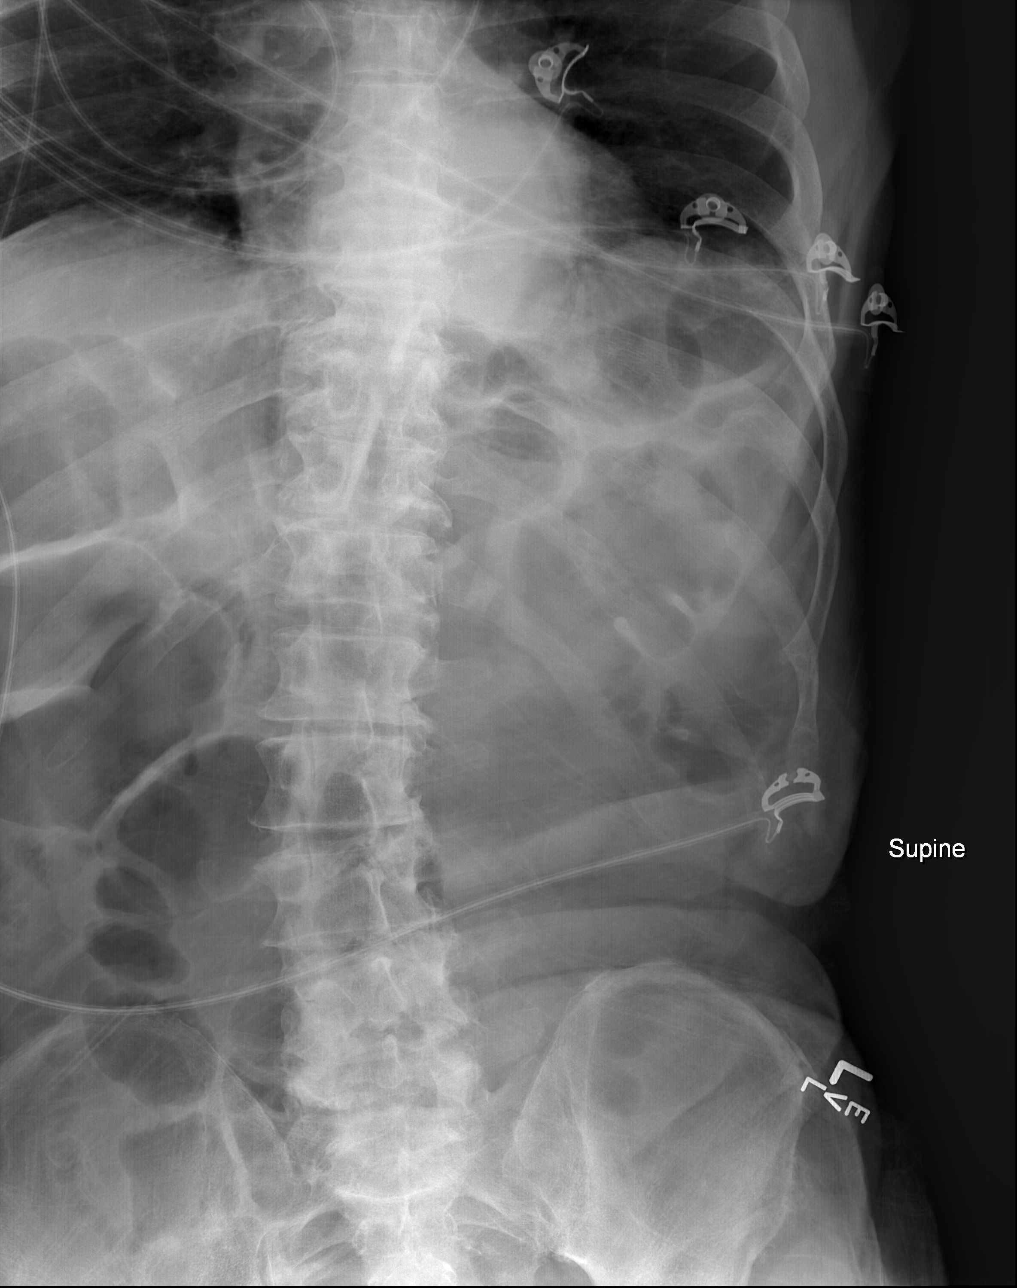

[4 of 4 positions shown; findings below may reference images not displayed]

FINDINGS: Normal cardiac silhouette. Mild bibasilar atelectasis. No
pneumothorax.

No evidence of intraperitoneal free air on the decubitus view.
Gas-filled loops of colon are prominent but within normal limits. No
dilated loops of small bowel. No pathologic calcifications. No
organomegaly.
IMPRESSION: 1. Bibasilar atelectasis.
2. No intraperitoneal free air identified.  No bowel obstruction.
3. Gas distended colon.

## 2018-02-21 ENCOUNTER — Ambulatory Visit: Payer: Medicare Other | Admitting: Family Medicine

## 2018-02-21 ENCOUNTER — Encounter: Payer: Self-pay | Admitting: Family Medicine

## 2018-02-21 VITALS — BP 110/80 | HR 59 | Ht 69.0 in | Wt 156.4 lb

## 2018-02-21 DIAGNOSIS — R011 Cardiac murmur, unspecified: Secondary | ICD-10-CM

## 2018-02-21 DIAGNOSIS — I10 Essential (primary) hypertension: Secondary | ICD-10-CM | POA: Diagnosis not present

## 2018-02-21 DIAGNOSIS — D649 Anemia, unspecified: Secondary | ICD-10-CM | POA: Diagnosis not present

## 2018-02-21 DIAGNOSIS — F329 Major depressive disorder, single episode, unspecified: Secondary | ICD-10-CM

## 2018-02-21 DIAGNOSIS — F32A Depression, unspecified: Secondary | ICD-10-CM

## 2018-02-21 LAB — BASIC METABOLIC PANEL
BUN: 25 mg/dL — ABNORMAL HIGH (ref 6–23)
CHLORIDE: 107 meq/L (ref 96–112)
CO2: 30 mEq/L (ref 19–32)
Calcium: 9 mg/dL (ref 8.4–10.5)
Creatinine, Ser: 2.14 mg/dL — ABNORMAL HIGH (ref 0.40–1.50)
GFR: 38.42 mL/min — AB (ref >60.00)
Glucose, Bld: 127 mg/dL — ABNORMAL HIGH (ref 70–99)
POTASSIUM: 3.7 meq/L (ref 3.5–5.1)
SODIUM: 144 meq/L (ref 135–145)

## 2018-02-21 LAB — CBC
HCT: 38.2 % — ABNORMAL LOW (ref 39.0–52.0)
HEMOGLOBIN: 12.2 g/dL — AB (ref 13.0–17.0)
MCHC: 32 g/dL (ref 30.0–36.0)
MCV: 81 fl (ref 78.0–100.0)
PLATELETS: 107 10*3/uL — AB (ref 150.0–400.0)
RBC: 4.72 Mil/uL (ref 4.22–5.81)
RDW: 16.6 % — ABNORMAL HIGH (ref 11.5–15.5)
WBC: 6 10*3/uL (ref 4.0–10.5)

## 2018-02-21 LAB — VITAMIN B12: Vitamin B-12: 436 pg/mL (ref 211–911)

## 2018-02-21 NOTE — Progress Notes (Signed)
Subjective:  Patient ID: Anthony [name], male    DOB: August 09, 1935  Age: 82 y.o. MRN: [id number]  CC: Establish Care   HPI Anthony [name] presents for establishment of care by transfer.  He is accompanied by his daughter.  Patient is suffered from slowly progressive dementia.  His onset was believed to be around 2013.  Daughter believes that he has benefited greatly from the Aricept and Namenda.  To this day she feels as though she can tell a difference when he misses a dose.  He is at home.  He has round-the-clock care per family.  He is needing assistance with most of his ADLs to include: Toileting, shaving, eating and so forth.  He remains able to dress himself with a closer laid out for him.  He does use depends.  He is a retired Geophysicist/field seismologist at [employer].  He is seeing psychiatry for severe depression.  Remeron has made a great difference for him.  He had suffered from severe hypertension that had required 3 medicines.  His blood pressure has normalized with weight loss and dietary changes.  He continues to take Lasix.  Past medical history of gout.  He never was a particularly heavy alcohol drinker.  Daughter is not sure when he had his last gouty attack.  Outpatient Medications Prior to Visit  Medication Sig Dispense Refill  . acetaminophen (TYLENOL) 325 MG tablet Take 2 tablets (650 mg total) by mouth every 6 (six) hours as needed for mild pain, moderate pain or headache (or Fever >/= 101). 40 tablet 0  . allopurinol (ZYLOPRIM) 300 MG tablet TAKE ONE TABLET BY MOUTH ONCE DAILY 90 tablet 3  . aspirin 81 MG tablet Take 1 tablet (81 mg total) by mouth daily. 90 tablet 2  . donepezil (ARICEPT) 10 MG tablet Take 1 tablet (10 mg total) by mouth daily with breakfast. 90 tablet 3  . doxazosin (CARDURA) 4 MG tablet Take 1 tablet (4 mg total) daily by mouth. 30 tablet 11  . famotidine (PEPCID) 40 MG tablet Take 1 tablet (40 mg total) by mouth at bedtime. (Patient taking  differently: Take 40 mg by mouth daily. ) 30 tablet 1  . furosemide (LASIX) 20 MG tablet Take 1 tablet (20 mg total) by mouth daily. 90 tablet 3  . memantine (NAMENDA) 10 MG tablet Take 1 tablet (10 mg total) by mouth 2 (two) times daily. 180 tablet 3  . mirtazapine (REMERON SOL-TAB) 15 MG disintegrating tablet DISSOLVE 1 TABLET BY MOUTH AT BEDTIME 30 tablet 8  . Nutritional Supplements (ENSURE NUTRITION SHAKE) LIQD Take 1 Bottle by mouth 4 (four) times daily. 120 Bottle 11  . pantoprazole (PROTONIX) 40 MG tablet TAKE ONE TABLET BY MOUTH TWICE DAILY 180 tablet 3  . traZODone (DESYREL) 50 MG tablet TAKE 1/2 (ONE-HALF) TABLET BY MOUTH AT BEDTIME 15 tablet 3  . traZODone (DESYREL) 50 MG tablet TAKE ONE-HALF TABLET BY MOUTH DAILY AT BEDTIME 15 tablet 3   No facility-administered medications prior to visit.     ROS Review of Systems  Constitutional: Negative for chills, fatigue, fever and unexpected weight change.  HENT: Negative.   Eyes: Negative.   Respiratory: Negative.   Cardiovascular: Negative.   Gastrointestinal: Negative.   Endocrine: Negative for polyphagia and polyuria.  Genitourinary: Negative.   Skin: Negative for pallor and rash.  Allergic/Immunologic: Negative for immunocompromised state.  Psychiatric/Behavioral: Positive for confusion and decreased concentration. Negative for agitation and behavioral problems.  Objective:  BP 110/80   Pulse (!) 59   Ht 5\' 9"  (1.753 m)   Wt 156 lb 6 oz (70.9 kg)   SpO2 100%   BMI 23.09 kg/m   BP Readings from Last 3 Encounters:  02/21/18 110/80  11/21/17 (!) 160/100  10/19/17 (!) 142/80    Wt Readings from Last 3 Encounters:  02/21/18 156 lb 6 oz (70.9 kg)  11/21/17 157 lb (71.2 kg)  10/19/17 157 lb 9.6 oz (71.5 kg)    Physical Exam  Constitutional: He appears well-developed and well-nourished. No distress.  HENT:  Head: Normocephalic and atraumatic.  Right Ear: External ear normal.  Left Ear: External ear normal.    Mouth/Throat: Oropharynx is clear and moist.  Eyes: Right eye exhibits no discharge. Left eye exhibits no discharge. No scleral icterus.  Neck: No tracheal deviation present.  Cardiovascular: Normal rate and regular rhythm.  Murmur heard. Pulmonary/Chest: Effort normal and breath sounds normal.  Neurological: He is alert.  Skin: Skin is warm and dry. He is not diaphoretic.    Lab Results  Component Value Date   WBC 6.5 08/20/2017   HGB 11.7 (L) 08/20/2017   HCT 37.7 (L) 08/20/2017   PLT 123.0 (L) 08/20/2017   GLUCOSE 94 08/20/2017   CHOL 160 09/11/2016   TRIG 112.0 09/11/2016   HDL 35.10 (L) 09/11/2016   LDLDIRECT 136.7 04/30/2009   LDLCALC 102 (H) 09/11/2016   ALT 11 (L) 09/27/2016   AST 44 (H) 09/27/2016   NA 141 08/20/2017   K 3.4 (L) 08/20/2017   CL 104 08/20/2017   CREATININE 1.89 (H) 08/20/2017   BUN 20 08/20/2017   CO2 31 08/20/2017   TSH 0.913 09/27/2016   PSA 1.93 09/11/2016   INR 1.14 02/20/2016   HGBA1C 5.1 09/11/2016   MICROALBUR 76.29 (H) 09/02/2013    Dg Abd Acute W/chest  Result Date: 09/27/2016 CLINICAL DATA:  82 y/o male with emesis and diarrhea x3-4 days. Pt's family notes decreased appetite as well. No hx abd issues. EXAM: DG ABDOMEN ACUTE W/ 1V CHEST COMPARISON:  04/11/2016 FINDINGS: Normal cardiac silhouette. Mild bibasilar atelectasis. No pneumothorax. No evidence of intraperitoneal free air on the decubitus view. Gas-filled loops of colon are prominent but within normal limits. No dilated loops of small bowel. No pathologic calcifications. No organomegaly. IMPRESSION: 1. Bibasilar atelectasis. 2. No intraperitoneal free air identified.  No bowel obstruction. 3. Gas distended colon. Electronically Signed   By: Genevive Bi M.D.   On: 09/27/2016 20:44    Assessment & Plan:   Ivey was seen today for establish care.  Diagnoses and all orders for this visit:  Essential hypertension -     Basic metabolic panel -     CBC  Anemia,  unspecified type -     CBC -     Vitamin B12 -     Iron, TIBC and Ferritin Panel  Heart murmur  Depression, unspecified depression type   I am having Mancel E. Dickerman maintain his aspirin, acetaminophen, famotidine, ENSURE NUTRITION SHAKE, furosemide, donepezil, memantine, doxazosin, pantoprazole, allopurinol, traZODone, and mirtazapine.  No orders of the defined types were placed in this encounter.  At this time will continue all of his medicines.  He will follow-up in 3 months for recheck.  Follow-up: Return in about 3 months (around 05/23/2018).  Mliss Sax, MD

## 2018-02-22 LAB — IRON,TIBC AND FERRITIN PANEL
%SAT: 41 % (calc) (ref 20–48)
Ferritin: 83 ng/mL (ref 24–380)
Iron: 85 ug/dL (ref 50–180)
TIBC: 205 mcg/dL (calc) — ABNORMAL LOW (ref 250–425)

## 2018-02-25 ENCOUNTER — Ambulatory Visit: Payer: Self-pay | Admitting: Endocrinology

## 2018-02-28 ENCOUNTER — Other Ambulatory Visit: Payer: Self-pay | Admitting: Endocrinology

## 2018-03-12 ENCOUNTER — Other Ambulatory Visit: Payer: Self-pay | Admitting: Endocrinology

## 2018-03-14 NOTE — Telephone Encounter (Signed)
Is this still your patient?

## 2018-03-14 NOTE — Telephone Encounter (Signed)
Until 04/19/18, or he establishes with a new PCP---whichever is first.

## 2018-03-15 NOTE — Telephone Encounter (Signed)
Per chart pt has new PCP, sent refill till last till 11/19

## 2018-04-09 ENCOUNTER — Ambulatory Visit: Payer: Self-pay | Admitting: Neurology

## 2018-05-01 ENCOUNTER — Encounter: Payer: Self-pay | Admitting: Family Medicine

## 2018-05-09 ENCOUNTER — Other Ambulatory Visit: Payer: Self-pay | Admitting: Endocrinology

## 2018-05-10 NOTE — Telephone Encounter (Signed)
Please advise if refill is appropriate 

## 2018-05-10 NOTE — Telephone Encounter (Signed)
Please refill x 3 months Further refills would have to be considered by new PCP   

## 2018-05-23 ENCOUNTER — Ambulatory Visit: Payer: Self-pay | Admitting: Family Medicine

## 2018-05-29 ENCOUNTER — Other Ambulatory Visit: Payer: Self-pay | Admitting: Neurology

## 2018-05-29 DIAGNOSIS — F0390 Unspecified dementia without behavioral disturbance: Secondary | ICD-10-CM

## 2018-05-29 DIAGNOSIS — F03B0 Unspecified dementia, moderate, without behavioral disturbance, psychotic disturbance, mood disturbance, and anxiety: Secondary | ICD-10-CM

## 2018-05-29 NOTE — Progress Notes (Addendum)
Established Patient Office Visit  Subjective:  Patient ID: Anthony [name], male    DOB: [birth year]  Age: 82 y.o. MRN: [id number]  CC:  Chief Complaint  Patient presents with  . Follow-up    HPI Anthony [name] presents for for routine follow-up.  He is accompanied by his daughter is most concerned about a video she captured of him on her cell phone.  She shows this to me.  In the video patient had a fixed gaze with a rhythmic tremor involving his facial muscles and jaw.  It lasted for seconds.  Patient did not appear to exhibit a post ictal state and was not incontinent after the episode.  He snapped out of it when his daughter called his name.  Patient has a pruritic scaling rash on his left lower leg.  It has responded to over-the-counter steroid.  It frequently becomes hyperpigmented.  Patient has a lesion in his left temporal area.  Daughter says that he frequently picks at it.  Past Medical History:  Diagnosis Date  . ABNORMAL ELECTROCARDIOGRAM 05/03/2010  . Alzheimer disease (HCC)   . GALLSTONES 03/15/2007  . GOUT 03/15/2007  . HYPERLIPIDEMIA 03/15/2007  . HYPERTENSION 03/15/2007  . NEPHROPATHY, DIABETIC 03/15/2007  . RENAL INSUFFICIENCY 04/30/2009  . SHOULDER PAIN, BILATERAL 04/29/2008  . UROLITHIASIS, HX OF 03/15/2007    Past Surgical History:  Procedure Laterality Date  . NASAL SEPTUM SURGERY  1978    Family History  Problem Relation Age of Onset  . Cancer Father        Prostate Cancer  . Colon cancer Neg Hx   . Esophageal cancer Neg Hx   . Stomach cancer Neg Hx     Social History   Socioeconomic History  . Marital status: Divorced    Spouse name: Not on file  . Number of children: Not on file  . Years of education: Not on file  . Highest education level: Not on file  Occupational History  . Occupation: Retired  Engineer, production  . Financial resource strain: Not on file  . Food insecurity:    Worry: Not on file    Inability: Not on file  . Transportation  needs:    Medical: Not on file    Non-medical: Not on file  Tobacco Use  . Smoking status: Never Smoker  . Smokeless tobacco: Never Used  Substance and Sexual Activity  . Alcohol use: No    Alcohol/week: 0.0 standard drinks  . Drug use: No  . Sexual activity: Never  Lifestyle  . Physical activity:    Days per week: Not on file    Minutes per session: Not on file  . Stress: Not on file  Relationships  . Social connections:    Talks on phone: Not on file    Gets together: Not on file    Attends religious service: Not on file    Active member of club or organization: Not on file    Attends meetings of clubs or organizations: Not on file    Relationship status: Not on file  . Intimate partner violence:    Fear of current or ex partner: Not on file    Emotionally abused: Not on file    Physically abused: Not on file    Forced sexual activity: Not on file  Other Topics Concern  . Not on file  Social History Narrative   NOK is Daughter Sharol Given          Outpatient  Medications Prior to Visit  Medication Sig Dispense Refill  . acetaminophen (TYLENOL) 325 MG tablet Take 2 tablets (650 mg total) by mouth every 6 (six) hours as needed for mild pain, moderate pain or headache (or Fever >/= 101). 40 tablet 0  . allopurinol (ZYLOPRIM) 300 MG tablet TAKE ONE TABLET BY MOUTH ONCE DAILY 90 tablet 3  . aspirin 81 MG tablet Take 1 tablet (81 mg total) by mouth daily. 90 tablet 2  . donepezil (ARICEPT) 10 MG tablet Take 1 tablet (10 mg total) by mouth daily with breakfast. 90 tablet 3  . doxazosin (CARDURA) 4 MG tablet TAKE 1 TABLET BY MOUTH ONCE DAILY 90 tablet 3  . furosemide (LASIX) 20 MG tablet TAKE 1 TABLET BY MOUTH ONCE DAILY 90 tablet 0  . memantine (NAMENDA) 10 MG tablet TAKE 1 TABLET BY MOUTH TWICE DAILY 180 tablet 3  . mirtazapine (REMERON SOL-TAB) 15 MG disintegrating tablet DISSOLVE 1 TABLET BY MOUTH AT BEDTIME 30 tablet 8  . Nutritional Supplements (ENSURE NUTRITION  SHAKE) LIQD Take 1 Bottle by mouth 4 (four) times daily. 120 Bottle 11  . pantoprazole (PROTONIX) 40 MG tablet TAKE ONE TABLET BY MOUTH TWICE DAILY 180 tablet 3  . traZODone (DESYREL) 50 MG tablet TAKE 1/2 (ONE-HALF) TABLET BY MOUTH AT BEDTIME 45 tablet 0  . famotidine (PEPCID) 40 MG tablet Take 1 tablet (40 mg total) by mouth at bedtime. (Patient taking differently: Take 40 mg by mouth daily. ) 30 tablet 1   No facility-administered medications prior to visit.     Allergies  Allergen Reactions  . Amlodipine-Atorvastatin Other (See Comments)    REACTION: LEG CRAMPS  . Benazepril Hcl Other (See Comments)    REACTION: Cough  . Ezetimibe Other (See Comments)    REACTION: edema    ROS Review of Systems  Constitutional: Negative for chills, diaphoresis, fatigue, fever and unexpected weight change.  Respiratory: Negative.   Cardiovascular: Negative.   Gastrointestinal: Negative.   Endocrine: Negative for polyphagia and polyuria.  Skin: Positive for color change and rash. Negative for wound.  Allergic/Immunologic: Negative for immunocompromised state.  Neurological: Negative for syncope and speech difficulty.  Psychiatric/Behavioral: Positive for confusion. Negative for agitation and behavioral problems.      Objective:    Physical Exam  Constitutional: He is oriented to person, place, and time. He appears well-developed and well-nourished. No distress.  HENT:  Head: Normocephalic and atraumatic.  Right Ear: External ear normal.  Left Ear: External ear normal.  Eyes: Pupils are equal, round, and reactive to light. Conjunctivae and EOM are normal. Right eye exhibits no discharge. Left eye exhibits no discharge. No scleral icterus.  Neck: Neck supple. No JVD present. No tracheal deviation present. No thyromegaly present.  Cardiovascular: Normal rate and regular rhythm.  Murmur heard. Pulmonary/Chest: Effort normal and breath sounds normal. No stridor.  Abdominal: Bowel sounds are  normal.  Musculoskeletal:        General: No edema.  Lymphadenopathy:    He has no cervical adenopathy.  Neurological: He is alert and oriented to person, place, and time.  Skin: Skin is warm and dry. He is not diaphoretic.     Psychiatric: He has a normal mood and affect. His behavior is normal.    BP 126/80   Pulse 60   Ht 5\' 9"  (1.753 m)   Wt 165 lb 6 oz (75 kg)   SpO2 99%   BMI 24.42 kg/m  Wt Readings from Last 3 Encounters:  05/30/18  165 lb 6 oz (75 kg)  02/21/18 156 lb 6 oz (70.9 kg)  10/19/17 157 lb 9.6 oz (71.5 kg)   BP Readings from Last 3 Encounters:  05/30/18 126/80  02/21/18 110/80  10/19/17 (!) 142/80   Health Maintenance Due  Topic Date Due  . OPHTHALMOLOGY EXAM  01/21/2013  . FOOT EXAM  10/17/2014  . HEMOGLOBIN A1C  03/14/2017  . INFLUENZA VACCINE  01/17/2018    There are no preventive care reminders to display for this patient.  Lab Results  Component Value Date   TSH 0.913 09/27/2016   Lab Results  Component Value Date   WBC 6.0 02/21/2018   HGB 12.2 (L) 02/21/2018   HCT 38.2 (L) 02/21/2018   MCV 81.0 02/21/2018   PLT 107.0 (L) 02/21/2018   Lab Results  Component Value Date   NA 144 02/21/2018   K 3.7 02/21/2018   CO2 30 02/21/2018   GLUCOSE 127 (H) 02/21/2018   BUN 25 (H) 02/21/2018   CREATININE 2.14 (H) 02/21/2018   BILITOT 1.8 (H) 09/27/2016   ALKPHOS 75 09/27/2016   AST 44 (H) 09/27/2016   ALT 11 (L) 09/27/2016   PROT 7.0 09/27/2016   ALBUMIN 3.5 09/27/2016   CALCIUM 9.0 02/21/2018   ANIONGAP 7 10/01/2016   GFR 38.42 (L) 02/21/2018   Lab Results  Component Value Date   CHOL 160 09/11/2016   Lab Results  Component Value Date   HDL 35.10 (L) 09/11/2016   Lab Results  Component Value Date   LDLCALC 102 (H) 09/11/2016   Lab Results  Component Value Date   TRIG 112.0 09/11/2016   Lab Results  Component Value Date   CHOLHDL 5 09/11/2016   Lab Results  Component Value Date   HGBA1C 5.1 09/11/2016        Assessment & Plan:   Problem List Items Addressed This Visit      Nervous and Auditory   Moderate dementia without behavioral disturbance (HCC) - Primary     Musculoskeletal and Integument   Lichen simplex chronicus   Relevant Medications   triamcinolone ointment (KENALOG) 0.1 %     Genitourinary   Disorder resulting from impaired renal function   Relevant Orders   Comprehensive metabolic panel     Other   Gout   Relevant Orders   Uric acid   Thrombocytopenia (HCC)   Relevant Orders   CBC   Anemia   Relevant Orders   CBC   Iron, TIBC and Ferritin Panel   Inclusion cyst      Meds ordered this encounter  Medications  . triamcinolone ointment (KENALOG) 0.1 %    Sig: Apply 1 application topically 2 (two) times daily.    Dispense:  30 g    Refill:  0    Follow-up: Return in about 3 months (around 08/29/2018).   Not certain that patient exhibited seizure activity.  He does have a history of hypocalcemia and I am rechecking that today.  Patient's daughter was given information on seizures and what to look for.  Follow-up with any further activity.

## 2018-05-30 ENCOUNTER — Ambulatory Visit: Payer: Medicare Other | Admitting: Family Medicine

## 2018-05-30 ENCOUNTER — Encounter: Payer: Self-pay | Admitting: Family Medicine

## 2018-05-30 VITALS — BP 126/80 | HR 60 | Ht 69.0 in | Wt 165.4 lb

## 2018-05-30 DIAGNOSIS — D696 Thrombocytopenia, unspecified: Secondary | ICD-10-CM

## 2018-05-30 DIAGNOSIS — F03B0 Unspecified dementia, moderate, without behavioral disturbance, psychotic disturbance, mood disturbance, and anxiety: Secondary | ICD-10-CM

## 2018-05-30 DIAGNOSIS — F0390 Unspecified dementia without behavioral disturbance: Secondary | ICD-10-CM | POA: Diagnosis not present

## 2018-05-30 DIAGNOSIS — D649 Anemia, unspecified: Secondary | ICD-10-CM

## 2018-05-30 DIAGNOSIS — L720 Epidermal cyst: Secondary | ICD-10-CM | POA: Insufficient documentation

## 2018-05-30 DIAGNOSIS — N259 Disorder resulting from impaired renal tubular function, unspecified: Secondary | ICD-10-CM

## 2018-05-30 DIAGNOSIS — M1A9XX0 Chronic gout, unspecified, without tophus (tophi): Secondary | ICD-10-CM | POA: Diagnosis not present

## 2018-05-30 DIAGNOSIS — L280 Lichen simplex chronicus: Secondary | ICD-10-CM

## 2018-05-30 MED ORDER — TRIAMCINOLONE ACETONIDE 0.1 % EX OINT: 1.0000 "application " | TOPICAL_OINTMENT | Freq: Two times a day (BID) | CUTANEOUS | 0 refills | Status: DC

## 2018-05-30 NOTE — Patient Instructions (Signed)
Non-Epileptic Seizures, Adult A seizure can cause:  Involuntary movements, like falling or shaking.  Changes in awareness or consciousness.  Convulsions. These are episodes of uncontrollable, jerking movement caused by sudden, intense tightening (contraction) of the muscles.  Epileptic seizures are caused by abnormal electrical activity in the brain. Non-epileptic seizures are different. They are not caused by abnormal electrical signals in your brain. These seizures may look like epileptic seizures, but they are not caused by epilepsy. There are two types of non-epileptic seizures:  Physiologic non-epileptic seizure. This type results from an underlying problem that causes a disruption in your brain's electrical activity.  Psychogenic non-epileptic seizure. This type results from emotional stress. These seizures are sometimes called pseudoseizures.  What are the causes? Causes of physiologic non-epileptic seizures can include:  Sudden drop in blood pressure.  Low blood sugar (glucose).  Low levels of salt (sodium) in your blood.  Low levels of calcium in your blood.  Migraine.  Heart rhythm problems.  Sleep disorders, such as narcolepsy.  Movement disorders, such as Tourette syndrome.  Infection.  Certain medicines.  Drug and alcohol abuse.  Fever.  Common causes of psychogenic non-epileptic seizures can include:  Stress.  Emotional trauma.  Sexual or physical abuse.  Major life events, such as divorce or death of a loved one.  Mental health disorders, including anxiety and depression.  What are the signs or symptoms? Symptoms of a non-epileptic seizure can be similar to those of an epileptic seizure, which may include:  A change in attention or behavior (altered mental status).  Loss of consciousness or fainting.  Convulsions with rhythmic jerking movements.  Drooling.  Rapid eye movements.  Grunting.  Loss of bladder control and bowel  control.  Bitter taste in the mouth.  Tongue biting.  Some people experience unusual sensations (aura) before having a seizure. These can include:  "Butterflies" in the stomach.  Abnormal smells or tastes.  A feeling of having had a new experience before (dj vu).  After a non-epileptic seizure, you may have a headache or sore muscles or feel confused and sleepy. Non-epileptic seizures usually:  Do not cause physical injuries.  Start slowly.  Include crying or shrieking.  Last longer than 2 minutes.  Include pelvic thrusting.  How is this diagnosed? Non-epileptic seizures may be diagnosed by:  Your medical history.  A physical exam.  Your symptoms. ? Your health care provider may want to talk with your friends or relatives who have seen you have a seizure. ? If possible, it is helpful if you write down your seizure activity, including what led up to the seizure, and share that information with your health care provider.  You may also need to have tests to look for causes of physiologic non-epileptic seizures. These may include:  An electroencephalogram (EEG). This test measures electrical activity in your brain. If you have had a non-epileptic seizure, the results of your EEG will likely be normal.  Video EEG. This test takes place in the hospital over the course of 2-7 days. The test uses a video camera and an EEG to monitor your symptoms and the electrical activity in your brain.  Blood tests.  Lumbar puncture. This test involves pulling fluid from your spine to check for infection.  Electrocardiogram (ECG or EKG). This test checks for an abnormal heart rhythm.  CT scan.  If your health care provider thinks you have had a psychogenic non-epileptic seizure, you may need to see a mental health specialist for an   evaluation. How is this treated? The treatment for your seizures will depend on what is causing them. When the underlying condition is treated, your  seizures should stop. If your seizures are being caused by emotional trauma or stress, your health care provider may recommend that you see a mental health professional. Treatment may include:  Relaxation therapy or cognitive behavioral therapy.  Medicines to treat depression or anxiety.  Individual or family counseling.  In some cases, you may have psychogenic seizures in addition to epileptic seizures. If this is the case, you may be prescribed medicine to help with the epileptic seizures. Follow these instructions at home: Home care will depend on the type of non-epileptic seizures that you have. In general:  Follow all instructions from your health care provider. These may include ways to prevent seizures and what to do if you have a seizure.  Take over-the-counter and prescription medicines only as told by your health care provider.  Keep all follow-up visits as told by your health care provider. This is important.  Make sure family members, friends, and coworkers are trained on how to help you if you have a seizure. If you have a seizure, they should: ? Lay you on the ground to prevent a fall. ? Place a pillow or piece of clothing under your head. ? Loosen any clothing around your neck. ? Turn you onto your side. If vomiting occurs, this helps keep your airway clear.  Avoid any substances that may prevent your medicine from working properly. If you are prescribed medicine for seizures: ? Do not use recreational drugs. ? Limit or avoid alcoholic beverages.  Contact a health care provider if:  Your seizures change or become more frequent.  You continue to have seizures after treatment. Get help right away if:  You injure yourself during a seizure.  You have one seizure after another.  You have trouble recovering from a seizure.  You have chest pain or trouble breathing.  You have a seizure that lasts longer than 5 minutes. Summary  Non-epileptic seizures may look  like epileptic seizures, but they are not caused by epilepsy.  The treatment for your seizures will depend on what is causing them. When the underlying condition is treated, your seizures should stop.  Make sure family members, friends, and coworkers are trained on how to help you if you have a seizure. If you have a seizure, they should lay you on the ground to prevent a fall, protect your head and neck, and turn you onto your side. This information is not intended to replace advice given to you by your health care provider. Make sure you discuss any questions you have with your health care provider. Document Released: 07/21/2005 Document Revised: 03/17/2016 Document Reviewed: 03/17/2016 Elsevier Interactive Patient Education  2018 [employer]  

## 2018-05-31 LAB — COMPREHENSIVE METABOLIC PANEL
ALT: 10 U/L (ref 0–53)
AST: 18 U/L (ref 0–37)
Albumin: 3.7 g/dL (ref 3.5–5.2)
Alkaline Phosphatase: 72 U/L (ref 39–117)
BUN: 22 mg/dL (ref 6–23)
CHLORIDE: 109 meq/L (ref 96–112)
CO2: 23 mEq/L (ref 19–32)
Calcium: 8.8 mg/dL (ref 8.4–10.5)
Creatinine, Ser: 1.96 mg/dL — ABNORMAL HIGH (ref 0.40–1.50)
GFR: 42.49 mL/min — ABNORMAL LOW (ref >60.00)
Glucose, Bld: 96 mg/dL (ref 70–99)
POTASSIUM: 3.7 meq/L (ref 3.5–5.1)
Sodium: 145 mEq/L (ref 135–145)
TOTAL PROTEIN: 6.7 g/dL (ref 6.0–8.3)
Total Bilirubin: 0.7 mg/dL (ref 0.2–1.2)

## 2018-05-31 LAB — CBC
HEMATOCRIT: 35.8 % — AB (ref 39.0–52.0)
HEMOGLOBIN: 11.3 g/dL — AB (ref 13.0–17.0)
MCHC: 31.6 g/dL (ref 30.0–36.0)
MCV: 82 fl (ref 78.0–100.0)
Platelets: 118 10*3/uL — ABNORMAL LOW (ref 150.0–400.0)
RBC: 4.37 Mil/uL (ref 4.22–5.81)
RDW: 16.3 % — AB (ref 11.5–15.5)
WBC: 5.8 10*3/uL (ref 4.0–10.5)

## 2018-05-31 LAB — URIC ACID: Uric Acid, Serum: 3.8 mg/dL — ABNORMAL LOW (ref 4.0–7.8)

## 2018-05-31 LAB — IRON,TIBC AND FERRITIN PANEL
%SAT: 53 % (calc) — ABNORMAL HIGH (ref 20–48)
Ferritin: 89 ng/mL (ref 24–380)
Iron: 90 ug/dL (ref 50–180)
TIBC: 169 mcg/dL (calc) — ABNORMAL LOW (ref 250–425)

## 2018-06-14 ENCOUNTER — Other Ambulatory Visit: Payer: Self-pay | Admitting: Endocrinology

## 2018-06-19 ENCOUNTER — Other Ambulatory Visit: Payer: Self-pay | Admitting: Neurology

## 2018-06-19 DIAGNOSIS — F0390 Unspecified dementia without behavioral disturbance: Secondary | ICD-10-CM

## 2018-06-19 DIAGNOSIS — F03B0 Unspecified dementia, moderate, without behavioral disturbance, psychotic disturbance, mood disturbance, and anxiety: Secondary | ICD-10-CM

## 2018-06-26 ENCOUNTER — Ambulatory Visit (INDEPENDENT_AMBULATORY_CARE_PROVIDER_SITE_OTHER): Payer: Medicare Other | Admitting: Psychiatry

## 2018-06-26 ENCOUNTER — Encounter (HOSPITAL_COMMUNITY): Payer: Self-pay | Admitting: Psychiatry

## 2018-06-26 VITALS — BP 126/74 | Ht 67.0 in | Wt 158.0 lb

## 2018-06-26 DIAGNOSIS — F0280 Dementia in other diseases classified elsewhere without behavioral disturbance: Secondary | ICD-10-CM | POA: Diagnosis not present

## 2018-06-26 DIAGNOSIS — G309 Alzheimer's disease, unspecified: Secondary | ICD-10-CM

## 2018-06-26 DIAGNOSIS — F329 Major depressive disorder, single episode, unspecified: Secondary | ICD-10-CM | POA: Diagnosis not present

## 2018-06-26 MED ORDER — MIRTAZAPINE 15 MG PO TBDP: ORAL_TABLET | ORAL | 8 refills | Status: DC

## 2018-06-26 NOTE — Progress Notes (Signed)
Psychiatric Initial Adult Assessment   Patient Identification: Anthony [name] MRN:  [id number] Date of Evaluation:  06/26/2018 Referral Source: Dr. Melford Aase Chief Complaint:   Visit Diagnosi dementia with depression  History of Present Illness:   This patient is treated in this office for depression from dementia.  He was diagnosed with Alzheimer's dementia.  He has associated depression with it.  He has end-stage dementia.  While he can still walk he does feed himself he only times remembers the daughter brings him.  He lives with the other daughter Anthony [name] who is not here today.  Patient generally is calm and compliant.  Since being on Remeron is done very well.  He sleeps and eats without problem.  He shows no evidence of depression.  He does not crying.  He has no evidence of psychosis.  He certainly is not agitated.  He is incontinent of urine.  He needs assistance dressing.  He is 24-hour care from his family.  Today we talked to them about [facility] as a possible daycare solution.  The patient is very stable at this time.  Associated Signs/Symptoms: Depression Symptoms:  weight loss, (Hypo) Manic Symptoms:   Anxiety Symptoms:   Psychotic Symptoms:   PTSD Symptoms:   Past Psychiatric History: none  Previous Psychotropic Medications:   Substance Abuse History in the last 12 months:    Consequences of Substance Abuse:   Past Medical History:  Past Medical History:  Diagnosis Date  . ABNORMAL ELECTROCARDIOGRAM 05/03/2010  . Alzheimer disease (HCC)   . GALLSTONES 03/15/2007  . GOUT 03/15/2007  . HYPERLIPIDEMIA 03/15/2007  . HYPERTENSION 03/15/2007  . NEPHROPATHY, DIABETIC 03/15/2007  . RENAL INSUFFICIENCY 04/30/2009  . SHOULDER PAIN, BILATERAL 04/29/2008  . UROLITHIASIS, HX OF 03/15/2007    Past Surgical History:  Procedure Laterality Date  . NASAL SEPTUM SURGERY  1978    Family Psychiatric History:   Family History:  Family History  Problem Relation  Age of Onset  . Cancer Father        Prostate Cancer  . Colon cancer Neg Hx   . Esophageal cancer Neg Hx   . Stomach cancer Neg Hx     Social History:   Social History   Socioeconomic History  . Marital status: Divorced    Spouse name: Not on file  . Number of children: Not on file  . Years of education: Not on file  . Highest education level: Not on file  Occupational History  . Occupation: Retired  Engineer, production  . Financial resource strain: Not on file  . Food insecurity:    Worry: Not on file    Inability: Not on file  . Transportation needs:    Medical: Not on file    Non-medical: Not on file  Tobacco Use  . Smoking status: Never Smoker  . Smokeless tobacco: Never Used  Substance and Sexual Activity  . Alcohol use: No    Alcohol/week: 0.0 standard drinks  . Drug use: No  . Sexual activity: Never  Lifestyle  . Physical activity:    Days per week: Not on file    Minutes per session: Not on file  . Stress: Not on file  Relationships  . Social connections:    Talks on phone: Not on file    Gets together: Not on file    Attends religious service: Not on file    Active member of club or organization: Not on file    Attends meetings of clubs  or organizations: Not on file    Relationship status: Not on file  Other Topics Concern  . Not on file  Social History Narrative   NOK is Daughter Anthony [name]          Additional Social History:   Allergies:   Allergies  Allergen Reactions  . Amlodipine-Atorvastatin Other (See Comments)    REACTION: LEG CRAMPS  . Benazepril Hcl Other (See Comments)    REACTION: Cough  . Ezetimibe Other (See Comments)    REACTION: edema    Metabolic Disorder Labs: Lab Results  Component Value Date   HGBA1C 5.1 09/11/2016   MPG 114 09/02/2013   No results found for: PROLACTIN Lab Results  Component Value Date   CHOL 160 09/11/2016   TRIG 112.0 09/11/2016   HDL 35.10 (L) 09/11/2016   CHOLHDL 5 09/11/2016   VLDL 22.4  09/11/2016   LDLCALC 102 (H) 09/11/2016   LDLCALC 94 09/10/2015     Current Medications: Current Outpatient Medications  Medication Sig Dispense Refill  . acetaminophen (TYLENOL) 325 MG tablet Take 2 tablets (650 mg total) by mouth every 6 (six) hours as needed for mild pain, moderate pain or headache (or Fever >/= 101). 40 tablet 0  . allopurinol (ZYLOPRIM) 300 MG tablet TAKE ONE TABLET BY MOUTH ONCE DAILY 90 tablet 3  . aspirin 81 MG tablet Take 1 tablet (81 mg total) by mouth daily. 90 tablet 2  . donepezil (ARICEPT) 10 MG tablet TAKE 1 TABLET BY MOUTH ONCE DAILY WITH BREAKFAST 90 tablet 1  . doxazosin (CARDURA) 4 MG tablet TAKE 1 TABLET BY MOUTH ONCE DAILY 30 tablet 0  . furosemide (LASIX) 20 MG tablet TAKE 1 TABLET BY MOUTH ONCE DAILY 90 tablet 0  . memantine (NAMENDA) 10 MG tablet TAKE 1 TABLET BY MOUTH TWICE DAILY 180 tablet 3  . mirtazapine (REMERON SOL-TAB) 15 MG disintegrating tablet DISSOLVE 1 TABLET BY MOUTH AT BEDTIME 30 tablet 8  . pantoprazole (PROTONIX) 40 MG tablet TAKE ONE TABLET BY MOUTH TWICE DAILY 180 tablet 3  . traZODone (DESYREL) 50 MG tablet TAKE 1/2 (ONE-HALF) TABLET BY MOUTH AT BEDTIME 45 tablet 0  . triamcinolone ointment (KENALOG) 0.1 % Apply 1 application topically 2 (two) times daily. 30 g 0   No current facility-administered medications for this visit.     Neurologic: Headache: No Seizure: No Paresthesias:No  Musculoskeletal: Strength & Muscle Tone: decreased Gait & Station: unsteady Patient leans: Backward and N/A  Psychiatric Specialty Exam: ROS  Blood pressure 126/74, height 5\' 7"  (1.702 m), weight 158 lb (71.7 kg).Body mass index is 24.75 kg/m.  General Appearance: Casual  Eye Contact:  Fair  Speech:  Slurred  Volume:  Decreased  Mood:  Euthymic and Hopeless  Affect:  Congruent  Thought Process:  Disorganized  Orientation:  Negative  Thought Content:  WDL  Suicidal Thoughts:  No  Homicidal Thoughts:  No  Memory: absent short median  long-term memory  Judgement:  Impaired  Insight:  Lacking  Psychomotor Activity:  Decreased  Concentration:    Recall:  Poor  Fund of Knowledge:Poor  Language: Poor  Akathisia:  No  Handed:  Right  AIMS (if indicated):    Assets:  Desire for Improvement  ADL's:  Impaired  Cognition: Impaired,  Severe  Sleep:  Normal     Treatment Plan Summary: This patient has 1 problem.  It is dementia with depression.  The patient takes Aricept and Namenda for his dementia.  For me he takes  Remeron 30 mg and does very well.  He is in a calm sensation.  He is sleeping and eating well.  He has had no falls.  He is able to walk without a problem.  At this time the family wishes to keep him home.  Today we will give him the telephone number of [facility] he will make contact with.  This organization hopefully will help him with day treatment and daycare.  Also be important educators for the family.  Patient will continue taking Remeron and return to see me in 7 months.  The patient is not suicidal.  He is not aggressive.  Seems to be fairly adjusted. Gypsy BalsamGerald I Naresh Althaus, MD 1/8/20204:04 PM

## 2018-06-27 ENCOUNTER — Other Ambulatory Visit: Payer: Self-pay | Admitting: Endocrinology

## 2018-06-29 ENCOUNTER — Other Ambulatory Visit: Payer: Self-pay | Admitting: Endocrinology

## 2018-06-29 NOTE — Telephone Encounter (Signed)
Please forward refill request to pt's new primary care provider.  

## 2018-07-09 ENCOUNTER — Ambulatory Visit: Payer: Self-pay | Admitting: Internal Medicine

## 2018-07-19 ENCOUNTER — Encounter: Payer: Self-pay | Admitting: Neurology

## 2018-07-19 ENCOUNTER — Ambulatory Visit: Payer: Medicare Other | Admitting: Neurology

## 2018-07-19 ENCOUNTER — Other Ambulatory Visit: Payer: Self-pay

## 2018-07-19 VITALS — BP 162/94 | Ht 69.0 in | Wt 165.0 lb

## 2018-07-19 DIAGNOSIS — F0390 Unspecified dementia without behavioral disturbance: Secondary | ICD-10-CM

## 2018-07-19 DIAGNOSIS — F03C0 Unspecified dementia, severe, without behavioral disturbance, psychotic disturbance, mood disturbance, and anxiety: Secondary | ICD-10-CM

## 2018-07-19 NOTE — Patient Instructions (Signed)
1. Continue Donepezil and Namenda, continue mirtazapine as prescribed 2. Follow-up with Dr. Donell BeersPlovsky and Dr. Doreene BurkeKremer. Follow-up as needed in our office, call for any changes
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
## 2018-08-05 ENCOUNTER — Encounter: Payer: Self-pay | Admitting: Family Medicine

## 2018-08-29 ENCOUNTER — Ambulatory Visit: Payer: Self-pay | Admitting: Family Medicine

## 2018-08-30 ENCOUNTER — Other Ambulatory Visit: Payer: Self-pay

## 2018-08-30 ENCOUNTER — Other Ambulatory Visit: Payer: Self-pay | Admitting: Family Medicine

## 2018-08-30 ENCOUNTER — Encounter: Payer: Self-pay | Admitting: Family Medicine

## 2018-08-30 ENCOUNTER — Ambulatory Visit: Payer: Medicare Other | Admitting: Family Medicine

## 2018-08-30 VITALS — BP 120/80 | HR 84 | Ht 69.0 in | Wt 158.5 lb

## 2018-08-30 DIAGNOSIS — F0390 Unspecified dementia without behavioral disturbance: Secondary | ICD-10-CM

## 2018-08-30 DIAGNOSIS — N183 Chronic kidney disease, stage 3 unspecified: Secondary | ICD-10-CM

## 2018-08-30 DIAGNOSIS — D696 Thrombocytopenia, unspecified: Secondary | ICD-10-CM | POA: Diagnosis not present

## 2018-08-30 DIAGNOSIS — F03C0 Unspecified dementia, severe, without behavioral disturbance, psychotic disturbance, mood disturbance, and anxiety: Secondary | ICD-10-CM

## 2018-08-30 DIAGNOSIS — N259 Disorder resulting from impaired renal tubular function, unspecified: Secondary | ICD-10-CM

## 2018-08-30 DIAGNOSIS — L280 Lichen simplex chronicus: Secondary | ICD-10-CM

## 2018-08-30 NOTE — Progress Notes (Signed)
Established Patient Office Visit  Subjective:  Patient ID: Anthony [name], male    DOB: Apr 16, 1936  Age: 83 y.o. MRN: [id number]  CC:  Chief Complaint  Patient presents with  . Follow-up    HPI Anthony [name] presents for follow-up of his chronic renal disease and thrombocytopenia.  Patient continues to produce urine.  There is been no blood in the stool or hematuria.  He is dependent on assistance with all of his activities of daily living.  He is accompanied by his daughter today.  There is disruption to an extent in his sleep-wake cycles.  He continues on Remeron and trazodone.  Past Medical History:  Diagnosis Date  . ABNORMAL ELECTROCARDIOGRAM 05/03/2010  . Alzheimer disease (HCC)   . GALLSTONES 03/15/2007  . GOUT 03/15/2007  . HYPERLIPIDEMIA 03/15/2007  . HYPERTENSION 03/15/2007  . NEPHROPATHY, DIABETIC 03/15/2007  . RENAL INSUFFICIENCY 04/30/2009  . SHOULDER PAIN, BILATERAL 04/29/2008  . UROLITHIASIS, HX OF 03/15/2007    Past Surgical History:  Procedure Laterality Date  . NASAL SEPTUM SURGERY  1978    Family History  Problem Relation Age of Onset  . Cancer Father        Prostate Cancer  . Colon cancer Neg Hx   . Esophageal cancer Neg Hx   . Stomach cancer Neg Hx     Social History   Socioeconomic History  . Marital status: Divorced    Spouse name: Not on file  . Number of children: Not on file  . Years of education: Not on file  . Highest education level: Not on file  Occupational History  . Occupation: Retired  Engineer, production  . Financial resource strain: Not on file  . Food insecurity:    Worry: Not on file    Inability: Not on file  . Transportation needs:    Medical: Not on file    Non-medical: Not on file  Tobacco Use  . Smoking status: Never Smoker  . Smokeless tobacco: Never Used  Substance and Sexual Activity  . Alcohol use: No    Alcohol/week: 0.0 standard drinks  . Drug use: No  . Sexual activity: Never  Lifestyle  . Physical  activity:    Days per week: Not on file    Minutes per session: Not on file  . Stress: Not on file  Relationships  . Social connections:    Talks on phone: Not on file    Gets together: Not on file    Attends religious service: Not on file    Active member of club or organization: Not on file    Attends meetings of clubs or organizations: Not on file    Relationship status: Not on file  . Intimate partner violence:    Fear of current or ex partner: Not on file    Emotionally abused: Not on file    Physically abused: Not on file    Forced sexual activity: Not on file  Other Topics Concern  . Not on file  Social History Narrative   NOK is Daughter Sharol Given          Outpatient Medications Prior to Visit  Medication Sig Dispense Refill  . acetaminophen (TYLENOL) 325 MG tablet Take 2 tablets (650 mg total) by mouth every 6 (six) hours as needed for mild pain, moderate pain or headache (or Fever >/= 101). 40 tablet 0  . allopurinol (ZYLOPRIM) 300 MG tablet TAKE 1 TABLET BY MOUTH ONCE DAILY 90 tablet 0  .  aspirin 81 MG tablet Take 1 tablet (81 mg total) by mouth daily. 90 tablet 2  . donepezil (ARICEPT) 10 MG tablet TAKE 1 TABLET BY MOUTH ONCE DAILY WITH BREAKFAST 90 tablet 1  . doxazosin (CARDURA) 4 MG tablet TAKE 1 TABLET BY MOUTH ONCE DAILY 30 tablet 0  . furosemide (LASIX) 20 MG tablet TAKE 1 TABLET BY MOUTH ONCE DAILY 90 tablet 0  . memantine (NAMENDA) 10 MG tablet TAKE 1 TABLET BY MOUTH TWICE DAILY 180 tablet 3  . mirtazapine (REMERON SOL-TAB) 15 MG disintegrating tablet DISSOLVE 1 TABLET BY MOUTH AT BEDTIME 30 tablet 8  . pantoprazole (PROTONIX) 40 MG tablet TAKE 1 TABLET BY MOUTH TWICE DAILY 180 tablet 0  . traZODone (DESYREL) 50 MG tablet TAKE 1/2 (ONE-HALF) TABLET BY MOUTH AT BEDTIME 45 tablet 0  . triamcinolone ointment (KENALOG) 0.1 % Apply 1 application topically 2 (two) times daily. 30 g 0   No facility-administered medications prior to visit.     Allergies   Allergen Reactions  . Amlodipine-Atorvastatin Other (See Comments)    REACTION: LEG CRAMPS  . Benazepril Hcl Other (See Comments)    REACTION: Cough  . Ezetimibe Other (See Comments)    REACTION: edema    ROS Review of Systems  Constitutional: Negative.   Respiratory: Negative.   Cardiovascular: Negative.   Gastrointestinal: Negative.   Neurological: Negative.   Psychiatric/Behavioral: Positive for confusion and decreased concentration. Negative for behavioral problems.      Objective:    Physical Exam  Constitutional: He appears well-developed and well-nourished. No distress.  HENT:  Head: Normocephalic and atraumatic.  Right Ear: External ear normal.  Left Ear: External ear normal.  Eyes: Conjunctivae are normal. Right eye exhibits no discharge. Left eye exhibits no discharge. No scleral icterus.  Neck: No JVD present. No tracheal deviation present.  Cardiovascular: Normal rate, regular rhythm and normal heart sounds.  Pulmonary/Chest: Effort normal.  Neurological: He is alert.  Skin: He is not diaphoretic.  Psychiatric: He has a normal mood and affect. His behavior is normal.    BP 120/80   Pulse 84   Ht 5\' 9"  (1.753 m)   Wt 158 lb 8 oz (71.9 kg)   SpO2 96%   BMI 23.41 kg/m  Wt Readings from Last 3 Encounters:  08/30/18 158 lb 8 oz (71.9 kg)  07/19/18 165 lb (74.8 kg)  05/30/18 165 lb 6 oz (75 kg)   BP Readings from Last 3 Encounters:  08/30/18 120/80  07/19/18 (!) 162/94  05/30/18 126/80   Guideline developer:  UpToDate (see UpToDate for funding source) Date Released: June 2014  Health Maintenance Due  Topic Date Due  . OPHTHALMOLOGY EXAM  01/21/2013  . FOOT EXAM  10/17/2014  . HEMOGLOBIN A1C  03/14/2017  . INFLUENZA VACCINE  01/17/2018    There are no preventive care reminders to display for this patient.  Lab Results  Component Value Date   TSH 0.913 09/27/2016   Lab Results  Component Value Date   WBC 5.8 05/30/2018   HGB 11.3 (L)  05/30/2018   HCT 35.8 (L) 05/30/2018   MCV 82.0 05/30/2018   PLT 118.0 (L) 05/30/2018   Lab Results  Component Value Date   NA 145 05/30/2018   K 3.7 05/30/2018   CO2 23 05/30/2018   GLUCOSE 96 05/30/2018   BUN 22 05/30/2018   CREATININE 1.96 (H) 05/30/2018   BILITOT 0.7 05/30/2018   ALKPHOS 72 05/30/2018   AST 18 05/30/2018  ALT 10 05/30/2018   PROT 6.7 05/30/2018   ALBUMIN 3.7 05/30/2018   CALCIUM 8.8 05/30/2018   ANIONGAP 7 10/01/2016   GFR 42.49 (L) 05/30/2018   Lab Results  Component Value Date   CHOL 160 09/11/2016   Lab Results  Component Value Date   HDL 35.10 (L) 09/11/2016   Lab Results  Component Value Date   LDLCALC 102 (H) 09/11/2016   Lab Results  Component Value Date   TRIG 112.0 09/11/2016   Lab Results  Component Value Date   CHOLHDL 5 09/11/2016   Lab Results  Component Value Date   HGBA1C 5.1 09/11/2016      Assessment & Plan:   Problem List Items Addressed This Visit      Nervous and Auditory   Severe dementia (HCC)     Genitourinary   Disorder resulting from impaired renal function - Primary   Chronic renal disease, stage III (HCC)   Relevant Orders   Basic metabolic panel     Other   Thrombocytopenia (HCC)   Relevant Orders   CBC      No orders of the defined types were placed in this encounter.   Follow-up: Return in about 6 months (around 03/02/2019), or if symptoms worsen or fail to improve.    We will follow-up in 6 months instead of 3 secondary to emerging corona virus concerns.

## 2018-08-31 LAB — BASIC METABOLIC PANEL
BUN/Creatinine Ratio: 10 (calc) (ref 6–22)
BUN: 27 mg/dL — ABNORMAL HIGH (ref 7–25)
CHLORIDE: 109 mmol/L (ref 98–110)
CO2: 25 mmol/L (ref 20–32)
Calcium: 8.2 mg/dL — ABNORMAL LOW (ref 8.6–10.3)
Creat: 2.66 mg/dL — ABNORMAL HIGH (ref 0.70–1.11)
Glucose, Bld: 98 mg/dL (ref 65–99)
Potassium: 4 mmol/L (ref 3.5–5.3)
Sodium: 144 mmol/L (ref 135–146)

## 2018-08-31 LAB — CBC
HCT: 30.2 % — ABNORMAL LOW (ref 38.5–50.0)
Hemoglobin: 9.6 g/dL — ABNORMAL LOW (ref 13.2–17.1)
MCH: 25.3 pg — ABNORMAL LOW (ref 27.0–33.0)
MCHC: 31.8 g/dL — AB (ref 32.0–36.0)
MCV: 79.5 fL — ABNORMAL LOW (ref 80.0–100.0)
Platelets: 178 10*3/uL (ref 140–400)
RBC: 3.8 10*6/uL — ABNORMAL LOW (ref 4.20–5.80)
RDW: 15.8 % — ABNORMAL HIGH (ref 11.0–15.0)
WBC: 6.2 10*3/uL (ref 3.8–10.8)

## 2018-09-02 ENCOUNTER — Encounter (HOSPITAL_COMMUNITY): Payer: Self-pay | Admitting: Emergency Medicine

## 2018-09-02 ENCOUNTER — Other Ambulatory Visit: Payer: Self-pay

## 2018-09-02 ENCOUNTER — Emergency Department (HOSPITAL_COMMUNITY)
Admission: EM | Admit: 2018-09-02 | Discharge: 2018-09-02 | Disposition: A | Discharge status: Home / Self Care | Payer: Medicare Other | Attending: Emergency Medicine | Admitting: Emergency Medicine

## 2018-09-02 DIAGNOSIS — Z79899 Other long term (current) drug therapy: Secondary | ICD-10-CM | POA: Diagnosis not present

## 2018-09-02 DIAGNOSIS — N390 Urinary tract infection, site not specified: Secondary | ICD-10-CM | POA: Insufficient documentation

## 2018-09-02 DIAGNOSIS — E785 Hyperlipidemia, unspecified: Secondary | ICD-10-CM | POA: Diagnosis not present

## 2018-09-02 DIAGNOSIS — N179 Acute kidney failure, unspecified: Secondary | ICD-10-CM

## 2018-09-02 DIAGNOSIS — D649 Anemia, unspecified: Secondary | ICD-10-CM | POA: Diagnosis not present

## 2018-09-02 DIAGNOSIS — N183 Chronic kidney disease, stage 3 (moderate): Secondary | ICD-10-CM | POA: Diagnosis not present

## 2018-09-02 DIAGNOSIS — I129 Hypertensive chronic kidney disease with stage 1 through stage 4 chronic kidney disease, or unspecified chronic kidney disease: Secondary | ICD-10-CM | POA: Insufficient documentation

## 2018-09-02 DIAGNOSIS — F0390 Unspecified dementia without behavioral disturbance: Secondary | ICD-10-CM | POA: Insufficient documentation

## 2018-09-02 DIAGNOSIS — R7989 Other specified abnormal findings of blood chemistry: Secondary | ICD-10-CM | POA: Diagnosis present

## 2018-09-02 LAB — URINALYSIS, ROUTINE W REFLEX MICROSCOPIC
Bilirubin Urine: NEGATIVE
Glucose, UA: NEGATIVE mg/dL
Hgb urine dipstick: NEGATIVE
Ketones, ur: NEGATIVE mg/dL
Nitrite: NEGATIVE
Protein, ur: NEGATIVE mg/dL
SPECIFIC GRAVITY, URINE: 1.012 (ref 1.005–1.030)
WBC, UA: >50 WBC/hpf — ABNORMAL HIGH (ref 0–5)
pH: 6 (ref 5.0–8.0)

## 2018-09-02 LAB — COMPREHENSIVE METABOLIC PANEL
ALT: 10 U/L (ref 0–44)
ANION GAP: 9 (ref 5–15)
AST: 23 U/L (ref 15–41)
Albumin: 2.7 g/dL — ABNORMAL LOW (ref 3.5–5.0)
Alkaline Phosphatase: 89 U/L (ref 38–126)
BUN: 21 mg/dL (ref 8–23)
CALCIUM: 8.7 mg/dL — AB (ref 8.9–10.3)
CO2: 25 mmol/L (ref 22–32)
Chloride: 107 mmol/L (ref 98–111)
Creatinine, Ser: 2.48 mg/dL — ABNORMAL HIGH (ref 0.61–1.24)
GFR calc Af Amer: 27 mL/min — ABNORMAL LOW (ref >60)
GFR calc non Af Amer: 24 mL/min — ABNORMAL LOW (ref >60)
Glucose, Bld: 85 mg/dL (ref 70–99)
Potassium: 4.9 mmol/L (ref 3.5–5.1)
Sodium: 141 mmol/L (ref 135–145)
Total Bilirubin: 1.3 mg/dL — ABNORMAL HIGH (ref 0.3–1.2)
Total Protein: 6.8 g/dL (ref 6.5–8.1)

## 2018-09-02 LAB — CBC WITH DIFFERENTIAL/PLATELET
Abs Immature Granulocytes: 0.02 10*3/uL (ref 0.00–0.07)
Basophils Absolute: 0.1 10*3/uL (ref 0.0–0.1)
Basophils Relative: 1 %
Eosinophils Absolute: 0.4 10*3/uL (ref 0.0–0.5)
Eosinophils Relative: 7 %
HCT: 33.3 % — ABNORMAL LOW (ref 39.0–52.0)
Hemoglobin: 10.6 g/dL — ABNORMAL LOW (ref 13.0–17.0)
Immature Granulocytes: 0 %
Lymphocytes Relative: 21 %
Lymphs Abs: 1.3 10*3/uL (ref 0.7–4.0)
MCH: 25.2 pg — ABNORMAL LOW (ref 26.0–34.0)
MCHC: 31.8 g/dL (ref 30.0–36.0)
MCV: 79.1 fL — ABNORMAL LOW (ref 80.0–100.0)
Monocytes Absolute: 0.4 10*3/uL (ref 0.1–1.0)
Monocytes Relative: 6 %
Neutro Abs: 4 10*3/uL (ref 1.7–7.7)
Neutrophils Relative %: 65 %
Platelets: 133 10*3/uL — ABNORMAL LOW (ref 150–400)
RBC: 4.21 MIL/uL — ABNORMAL LOW (ref 4.22–5.81)
RDW: 17.2 % — ABNORMAL HIGH (ref 11.5–15.5)
WBC: 6.2 10*3/uL (ref 4.0–10.5)
nRBC: 0 % (ref 0.0–0.2)

## 2018-09-02 LAB — POC OCCULT BLOOD, ED: Fecal Occult Bld: NEGATIVE

## 2018-09-02 MED ORDER — CEPHALEXIN 500 MG PO CAPS: 500.0000 mg | ORAL_CAPSULE | Freq: Three times a day (TID) | ORAL | 0 refills | Status: DC

## 2018-09-02 MED ORDER — SODIUM CHLORIDE 0.9 % IV BOLUS
500.0000 mL | Freq: Once | INTRAVENOUS | Status: AC
  Administered 2018-09-02: 500 mL via INTRAVENOUS

## 2018-09-02 MED ORDER — CEPHALEXIN 250 MG PO CAPS
500.0000 mg | ORAL_CAPSULE | Freq: Once | ORAL | Status: AC
  Administered 2018-09-02: 500 mg via ORAL
  Filled 2018-09-02: qty 2

## 2018-09-02 NOTE — ED Triage Notes (Signed)
Pt arrives with family who reports he was at his PCP on Friday and they reported he had abnormal labs. Pt has dementia and daughter is POA. Family states they have not noticed any blood in stool because he takes iron. Daughter has noticed pt has been walking slower.

## 2018-09-02 NOTE — ED Provider Notes (Signed)
[facility] EMERGENCY DEPARTMENT Provider Note   CSN: [id number] Arrival date & time: 09/02/18  1044    History   Chief Complaint Chief Complaint  Patient presents with  . Abnormal Lab    HPI Anthony [name] is a 83 y.o. male.     HPI Patient with history of dementia.  Unable to contribute to history.  Seen by his primary physician on Friday and had labs drawn.  Physician called family today and advised to come to the emergency department due to drop in hemoglobin and increased creatinine.  Daughter states patient has been acting close to his baseline.  Eating and drinking well.  Urinating well.  No obvious signs of blood in the stool.  No recent fever or chills.  No vomiting.  No cough or shortness of breath. Past Medical History:  Diagnosis Date  . ABNORMAL ELECTROCARDIOGRAM 05/03/2010  . Alzheimer disease (HCC)   . GALLSTONES 03/15/2007  . GOUT 03/15/2007  . HYPERLIPIDEMIA 03/15/2007  . HYPERTENSION 03/15/2007  . NEPHROPATHY, DIABETIC 03/15/2007  . RENAL INSUFFICIENCY 04/30/2009  . SHOULDER PAIN, BILATERAL 04/29/2008  . UROLITHIASIS, HX OF 03/15/2007    Patient Active Problem List   Diagnosis Date Noted  . Lichen simplex chronicus 05/30/2018  . Inclusion cyst 05/30/2018  . Pressure injury of skin 09/28/2016  . Acute renal failure superimposed on stage 3 chronic kidney disease (HCC) 09/28/2016  . Transient hypotension 09/28/2016  . Anemia due to chronic kidney disease 09/28/2016  . Protein-calorie malnutrition, severe 09/28/2016  . Nausea vomiting and diarrhea 09/27/2016  . Dysphagia 05/22/2016  . Acute renal failure (HCC) 03/09/2016  . Chronic renal disease, stage III (HCC) 03/09/2016  . Hypocalcemia 03/09/2016  . Heart murmur 03/09/2016  . Esophageal reflux   . BPH (benign prostatic hyperplasia)   . Lewy body dementia without behavioral disturbance (HCC)   . Melena 02/20/2016  . Gastrointestinal hemorrhage with melena 02/20/2016  . Near  syncope 02/20/2016  . GI bleed due to NSAIDs 02/20/2016  . Abnormal breath sounds 09/10/2015  . Severe dementia (HCC) 12/04/2014  . Hyperglycemia 05/08/2014  . Breath sounds, abnormal 10/16/2013  . Edema 09/03/2012  . Encounter for long-term (current) use of other medications 05/05/2011  . Screening for malignant neoplasm 05/05/2011  . Anemia 10/28/2010  . Routine general medical examination at a health care facility 10/28/2010  . ABNORMAL ELECTROCARDIOGRAM 05/03/2010  . Thrombocytopenia (HCC) 04/30/2009  . Disorder resulting from impaired renal function 04/30/2009  . SHOULDER PAIN, BILATERAL 04/29/2008  . NEPHROPATHY, DIABETIC 03/15/2007  . Dyslipidemia 03/15/2007  . Gout 03/15/2007  . HTN (hypertension) 03/15/2007  . GALLSTONES 03/15/2007  . UROLITHIASIS, HX OF 03/15/2007    Past Surgical History:  Procedure Laterality Date  . NASAL SEPTUM SURGERY  1978        Home Medications    Prior to Admission medications   Medication Sig Start Date End Date Taking? Authorizing Provider  acetaminophen (TYLENOL) 325 MG tablet Take 2 tablets (650 mg total) by mouth every 6 (six) hours as needed for mild pain, moderate pain or headache (or Fever >/= 101). 02/24/16   Vassie Loll, MD  allopurinol (ZYLOPRIM) 300 MG tablet TAKE 1 TABLET BY MOUTH ONCE DAILY 06/27/18   Romero Belling, MD  aspirin 81 MG tablet Take 1 tablet (81 mg total) by mouth daily. 07/03/12   Romero Belling, MD  cephALEXin (KEFLEX) 500 MG capsule Take 1 capsule (500 mg total) by mouth 3 (three) times daily. 09/02/18   Loren Racer,  MD  donepezil (ARICEPT) 10 MG tablet TAKE 1 TABLET BY MOUTH ONCE DAILY WITH BREAKFAST 06/20/18   Van Clines, MD  doxazosin (CARDURA) 4 MG tablet TAKE 1 TABLET BY MOUTH ONCE DAILY 06/14/18   Romero Belling, MD  furosemide (LASIX) 20 MG tablet TAKE 1 TABLET BY MOUTH ONCE DAILY 06/14/18   Romero Belling, MD  memantine (NAMENDA) 10 MG tablet TAKE 1 TABLET BY MOUTH TWICE DAILY 05/29/18   Van Clines, MD  mirtazapine (REMERON SOL-TAB) 15 MG disintegrating tablet DISSOLVE 1 TABLET BY MOUTH AT BEDTIME 06/26/18   Plovsky, Earvin Hansen, MD  pantoprazole (PROTONIX) 40 MG tablet TAKE 1 TABLET BY MOUTH TWICE DAILY 06/27/18   Romero Belling, MD  traZODone (DESYREL) 50 MG tablet TAKE 1/2 (ONE-HALF) TABLET BY MOUTH AT BEDTIME 05/10/18   Romero Belling, MD  triamcinolone ointment (KENALOG) 0.1 % APPLY  OINTMENT TOPICALLY TWICE DAILY 09/02/18   Mliss Sax, MD    Family History Family History  Problem Relation Age of Onset  . Cancer Father        Prostate Cancer  . Colon cancer Neg Hx   . Esophageal cancer Neg Hx   . Stomach cancer Neg Hx     Social History Social History   Tobacco Use  . Smoking status: Never Smoker  . Smokeless tobacco: Never Used  Substance Use Topics  . Alcohol use: No    Alcohol/week: 0.0 standard drinks  . Drug use: No     Allergies   Amlodipine-atorvastatin; Benazepril hcl; and Ezetimibe   Review of Systems Review of Systems  Unable to perform ROS: Dementia     Physical Exam Updated Vital Signs BP (!) 111/96   Pulse 65   Temp (!) 96.6 F (35.9 C) (Rectal)   Resp 16   Ht  (1.753 m)   Wt 71.9 kg   SpO2 96%   BMI 23.41 kg/m   Physical Exam Vitals signs and nursing note reviewed.  Constitutional:      Appearance: Normal appearance. He is well-developed.  HENT:     Head: Normocephalic and atraumatic.     Nose: Nose normal.     Mouth/Throat:     Mouth: Mucous membranes are moist.  Eyes:     Extraocular Movements: Extraocular movements intact.     Pupils: Pupils are equal, round, and reactive to light.  Neck:     Musculoskeletal: Normal range of motion and neck supple. No neck rigidity or muscular tenderness.  Cardiovascular:     Rate and Rhythm: Normal rate and regular rhythm.  Pulmonary:     Effort: Pulmonary effort is normal.     Breath sounds: Normal breath sounds. No stridor. No wheezing, rhonchi or rales.  Chest:      Chest wall: No tenderness.  Abdominal:     General: Bowel sounds are normal.     Palpations: Abdomen is soft.     Tenderness: There is no abdominal tenderness. There is no guarding or rebound.  Musculoskeletal: Normal range of motion.        General: No swelling, tenderness, deformity or signs of injury.     Right lower leg: No edema.     Left lower leg: No edema.  Lymphadenopathy:     Cervical: No cervical adenopathy.  Skin:    General: Skin is warm and dry.     Capillary Refill: Capillary refill takes less than 2 seconds.     Findings: No erythema or rash.  Neurological:  Mental Status: He is alert.     Comments: Confused but follows simple commands.  Moving all extremities without focal deficit.  Sensation intact.  Psychiatric:        Behavior: Behavior normal.      ED Treatments / Results  Labs (all labs ordered are listed, but only abnormal results are displayed) Labs Reviewed  CBC WITH DIFFERENTIAL/PLATELET - Abnormal; Notable for the following components:      Result Value   RBC 4.21 (*)    Hemoglobin 10.6 (*)    HCT 33.3 (*)    MCV 79.1 (*)    MCH 25.2 (*)    RDW 17.2 (*)    Platelets 133 (*)    All other components within normal limits  COMPREHENSIVE METABOLIC PANEL - Abnormal; Notable for the following components:   Creatinine, Ser 2.48 (*)    Calcium 8.7 (*)    Albumin 2.7 (*)    Total Bilirubin 1.3 (*)    GFR calc non Af Amer 24 (*)    GFR calc Af Amer 27 (*)    All other components within normal limits  URINALYSIS, ROUTINE W REFLEX MICROSCOPIC - Abnormal; Notable for the following components:   APPearance HAZY (*)    Leukocytes,Ua LARGE (*)    WBC, UA >50 (*)    Bacteria, UA FEW (*)    All other components within normal limits  URINE CULTURE  OCCULT BLOOD X 1 CARD TO LAB, STOOL  POC OCCULT BLOOD, ED    EKG None  Radiology No results found.  Procedures Procedures (including critical care time)  Medications Ordered in ED Medications   cephALEXin (KEFLEX) capsule 500 mg (has no administration in time range)  sodium chloride 0.9 % bolus 500 mL (0 mLs Intravenous Stopped 09/02/18 1424)     Initial Impression / Assessment and Plan / ED Course  I have reviewed the triage vital signs and the nursing notes.  Pertinent labs & imaging results that were available during my care of the patient were reviewed by me and considered in my medical decision making (see chart for details).       Hemoglobin is mildly decreased over baseline.  Guaiac negative.  Mild elevation in creatinine likely related to dehydration.  Patient does have evidence of urinary tract infection.  Urine sent for culture.  Will start on antibiotics.  Advised to follow-up with his primary physician.  Return precautions given.  Final Clinical Impressions(s) / ED Diagnoses   Final diagnoses:  Anemia, unspecified type  AKI (acute kidney injury) (HCC)  Acute lower UTI    ED Discharge Orders         Ordered    cephALEXin (KEFLEX) 500 MG capsule  3 times daily     09/02/18 1558           Loren Racer, MD 09/02/18 1615

## 2018-09-02 NOTE — ED Notes (Signed)
Family verbalized understanding of discharge paperwork and prescriptions.

## 2018-09-02 NOTE — ED Notes (Signed)
Ambulated pt to bathroom. Unable to give specimen at this time

## 2018-09-04 LAB — URINE CULTURE

## 2018-09-05 ENCOUNTER — Telehealth: Payer: Self-pay | Admitting: *Deleted

## 2018-09-05 NOTE — Telephone Encounter (Signed)
Post ED Visit - Positive Culture Follow-up  Culture report reviewed by antimicrobial stewardship pharmacist: [facility] []  Enzo Bi, Pharm.D. []  Celedonio Miyamoto, Pharm.D., BCPS AQ-ID []  Garvin Fila, Pharm.D., BCPS []  Georgina Pillion, Pharm.D., BCPS []  [place], [address].D., BCPS, AAHIVP []  Estella Husk, Pharm.D., BCPS, AAHIVP []  Lysle Pearl, PharmD, BCPS []  Phillips Climes, PharmD, BCPS []  Agapito Games, PharmD, BCPS []  Verlan Friends, PharmD [x]  Mervyn Gay, PharmD, BCPS []  Vinnie Level, PharmD  [facility] []  Len Childs, PharmD []  Greer Pickerel, PharmD []  Adalberto Cole, PharmD []  Perlie Gold, Rph []  Lonell Face) Jean Rosenthal, PharmD []  Earl Many, PharmD []  Junita Push, PharmD []  Dorna Leitz, PharmD []  Terrilee Files, PharmD []  Lynann Beaver, PharmD []  Keturah Barre, PharmD []  Loralee Pacas, PharmD []  Bernadene Person, PharmD   Positive urine culture Treated with Cephalexin, organism sensitive to the same and no further patient follow-up is required at this time.  Virl Axe [facility] 09/05/2018, 11:57 AM

## 2018-09-09 ENCOUNTER — Other Ambulatory Visit: Payer: Self-pay | Admitting: Endocrinology

## 2018-09-24 ENCOUNTER — Other Ambulatory Visit: Payer: Self-pay | Admitting: Endocrinology

## 2018-09-25 ENCOUNTER — Other Ambulatory Visit: Payer: Self-pay | Admitting: Endocrinology

## 2018-09-26 ENCOUNTER — Other Ambulatory Visit: Payer: Self-pay | Admitting: Family Medicine

## 2018-09-26 MED ORDER — ALLOPURINOL 300 MG PO TABS: 300.0000 mg | ORAL_TABLET | Freq: Every day | ORAL | 0 refills | Status: DC

## 2018-09-26 MED ORDER — PANTOPRAZOLE SODIUM 40 MG PO TBEC: 40.0000 mg | DELAYED_RELEASE_TABLET | Freq: Two times a day (BID) | ORAL | 0 refills | Status: DC

## 2018-09-26 MED ORDER — TRAZODONE HCL 50 MG PO TABS: ORAL_TABLET | ORAL | 0 refills | Status: DC

## 2018-09-26 NOTE — Telephone Encounter (Signed)
Requested Prescriptions  Pending Prescriptions Disp Refills  . traZODone (DESYREL) 50 MG tablet 45 tablet 0    Sig: TAKE 1/2 (ONE-HALF) TABLET BY MOUTH AT BEDTIME     Psychiatry: Antidepressants - Serotonin Modulator Passed - 09/26/2018 10:29 AM      Passed - Valid encounter within last 6 months    Recent Outpatient Visits          3 weeks ago Disorder resulting from impaired renal function   [facility] Doreene Burke, Talmadge Coventry, MD   3 months ago Moderate dementia without behavioral disturbance [facility])   [facility] Harvel Ricks, Talmadge Coventry, MD   7 months ago Essential hypertension   [facility] Doreene Burke, Talmadge Coventry, MD   1 year ago Right hand pain   [facility], Marye Round, MD   6 years ago DIABETES MELLITUS, TYPE II   [facility] Hem, MD      Future Appointments            In 5 months Doreene Burke, Talmadge Coventry, MD [facility], [facility]         . pantoprazole (PROTONIX) 40 MG tablet 180 tablet 0    Sig: Take 1 tablet (40 mg total) by mouth 2 (two) times daily.     Gastroenterology: Proton Pump Inhibitors Passed - 09/26/2018 10:29 AM      Passed - Valid encounter within last 12 months    Recent Outpatient Visits          3 weeks ago Disorder resulting from impaired renal function   [facility] Harvel Ricks, Talmadge Coventry, MD   3 months ago Moderate dementia without behavioral disturbance [facility])   [facility] Harvel Ricks, Talmadge Coventry, MD   7 months ago Essential hypertension   [facility] Doreene Burke, Talmadge Coventry, MD   1 year ago Right hand pain   [facility] Mans, Marye Round, MD   6 years ago DIABETES MELLITUS, TYPE II   [facility] -[facility]ristie Hem, MD      Future Appointments            In 5 months Doreene Burke, Talmadge Coventry,  MD [facility], [facility]         . allopurinol (ZYLOPRIM) 300 MG tablet 90 tablet 0    Sig: Take 1 tablet (300 mg total) by mouth daily.     Endocrinology:  Gout Agents Failed - 09/26/2018 10:29 AM      Failed - Uric Acid in normal range and within 360 days    Uric Acid, Serum  Date Value Ref Range Status  05/30/2018 3.8 (L) 4.0 - 7.8 mg/dL Final         Failed - Cr in normal range and within 360 days    Creat  Date Value Ref Range Status  08/30/2018 2.66 (H) 0.70 - 1.11 mg/dL Final    Comment:    For patients >72 years of age, the reference limit for Creatinine is approximately 13% higher for people identified as African-American. .    Creatinine, Ser  Date Value Ref Range Status  09/02/2018 2.48 (H) 0.61 - 1.24 mg/dL Final         Passed - Valid encounter within last 12 months    Recent Outpatient Visits          3 weeks ago Disorder resulting from impaired renal  function   [facility] Doreene BurkeKremer, Talmadge CoventryWilliam Alfred, MD   3 months ago Moderate dementia without behavioral disturbance [facility](HCC)   [facility]icks[facility] Kremer, Talmadge CoventryWilliam Alfred, MD   7 months ago Essential hypertension   [facility] Doreene BurkeKremer, Talmadge CoventryWilliam Alfred, MD   1 year ago Right hand pain   [facility] -[facility] MansElam Schmitz, Marye RoundJeremy E, MD   6 years ago DIABETES MELLITUS, TYPE II   [facility] -[facility]rist[facility] HemElam Ellison, Sean, MD      Future Appointments            In 5 months Doreene BurkeKremer, Talmadge CoventryWilliam Alfred, MD [facility], [facility]

## 2018-11-20 ENCOUNTER — Ambulatory Visit: Payer: Self-pay | Admitting: Neurology

## 2018-11-27 ENCOUNTER — Other Ambulatory Visit: Payer: Self-pay | Admitting: Neurology

## 2018-11-27 ENCOUNTER — Other Ambulatory Visit: Payer: Self-pay | Admitting: Endocrinology

## 2018-11-27 DIAGNOSIS — F03B0 Unspecified dementia, moderate, without behavioral disturbance, psychotic disturbance, mood disturbance, and anxiety: Secondary | ICD-10-CM

## 2018-11-27 DIAGNOSIS — F0390 Unspecified dementia without behavioral disturbance: Secondary | ICD-10-CM

## 2018-11-27 NOTE — Telephone Encounter (Signed)
Please forward refill request to pt's new primary care provider.  

## 2018-11-28 ENCOUNTER — Other Ambulatory Visit: Payer: Self-pay

## 2018-11-28 MED ORDER — FUROSEMIDE 20 MG PO TABS: 20.0000 mg | ORAL_TABLET | Freq: Every day | ORAL | 1 refills | Status: DC

## 2018-12-24 ENCOUNTER — Other Ambulatory Visit: Payer: Self-pay | Admitting: Family Medicine

## 2018-12-26 ENCOUNTER — Other Ambulatory Visit: Payer: Self-pay | Admitting: Family Medicine

## 2018-12-26 DIAGNOSIS — L280 Lichen simplex chronicus: Secondary | ICD-10-CM

## 2018-12-30 ENCOUNTER — Encounter: Payer: Self-pay | Admitting: Family Medicine

## 2019-01-16 ENCOUNTER — Encounter: Payer: Self-pay | Admitting: Family Medicine

## 2019-01-17 ENCOUNTER — Encounter: Payer: Self-pay | Admitting: Family Medicine

## 2019-01-17 ENCOUNTER — Other Ambulatory Visit: Payer: Self-pay

## 2019-01-17 ENCOUNTER — Ambulatory Visit: Payer: Medicare Other | Admitting: Family Medicine

## 2019-01-17 VITALS — BP 128/80 | Ht 69.0 in | Wt 174.4 lb

## 2019-01-17 DIAGNOSIS — M1A9XX0 Chronic gout, unspecified, without tophus (tophi): Secondary | ICD-10-CM | POA: Diagnosis not present

## 2019-01-17 DIAGNOSIS — R609 Edema, unspecified: Secondary | ICD-10-CM | POA: Diagnosis not present

## 2019-01-17 DIAGNOSIS — D509 Iron deficiency anemia, unspecified: Secondary | ICD-10-CM | POA: Diagnosis not present

## 2019-01-17 DIAGNOSIS — N183 Chronic kidney disease, stage 3 unspecified: Secondary | ICD-10-CM

## 2019-01-17 MED ORDER — FUROSEMIDE 40 MG PO TABS: 40.0000 mg | ORAL_TABLET | Freq: Every day | ORAL | 1 refills | Status: DC

## 2019-01-17 MED ORDER — ALLOPURINOL 100 MG PO TABS: 100.0000 mg | ORAL_TABLET | Freq: Every day | ORAL | 6 refills | Status: DC

## 2019-01-17 NOTE — Progress Notes (Signed)
Established Patient Office Visit  Subjective:  Patient ID: Anthony [name], male    DOB: 21-Apr-1936  Age: 83 y.o. MRN: [id number]  CC:  Chief Complaint  Patient presents with  . Leg Swelling    HPI Anthony [name] presents for evaluation of enlargement of the left breast noted yesterday.  He has been having increased fluid accumulation in his lower extremities.  He sleeps on 2 pillows.  He has not complained of chest pain or been noticeably short of breath.  He is quite sedentary.  Diet has included more fast food meals.  Normal systolic function noted on echo from 2018.  Patient is battling chronic kidney disease.  He is on 300 mg of allopurinol.  Past medical history of severe frequent gouty attacks but that is been years ago.  Patient is chronically incontinent and wears depends.  His caregivers have scheduled bathroom breaks for him.  Past Medical History:  Diagnosis Date  . ABNORMAL ELECTROCARDIOGRAM 05/03/2010  . Alzheimer disease ([place])   . GALLSTONES 03/15/2007  . GOUT 03/15/2007  . HYPERLIPIDEMIA 03/15/2007  . HYPERTENSION 03/15/2007  . NEPHROPATHY, DIABETIC 03/15/2007  . RENAL INSUFFICIENCY 04/30/2009  . SHOULDER PAIN, BILATERAL 04/29/2008  . UROLITHIASIS, HX OF 03/15/2007    Past Surgical History:  Procedure Laterality Date  . NASAL SEPTUM SURGERY  1978    Family History  Problem Relation Age of Onset  . Cancer Father        Prostate Cancer  . Colon cancer Neg Hx   . Esophageal cancer Neg Hx   . Stomach cancer Neg Hx     Social History   Socioeconomic History  . Marital status: Divorced    Spouse name: Not on file  . Number of children: Not on file  . Years of education: Not on file  . Highest education level: Not on file  Occupational History  . Occupation: Retired  Scientific laboratory technician  . Financial resource strain: Not on file  . Food insecurity    Worry: Not on file    Inability: Not on file  . Transportation needs    Medical: Not on file   Non-medical: Not on file  Tobacco Use  . Smoking status: Never Smoker  . Smokeless tobacco: Never Used  Substance and Sexual Activity  . Alcohol use: No    Alcohol/week: 0.0 standard drinks  . Drug use: No  . Sexual activity: Never  Lifestyle  . Physical activity    Days per week: Not on file    Minutes per session: Not on file  . Stress: Not on file  Relationships  . Social Herbalist on phone: Not on file    Gets together: Not on file    Attends religious service: Not on file    Active member of club or organization: Not on file    Attends meetings of clubs or organizations: Not on file    Relationship status: Not on file  . Intimate partner violence    Fear of current or ex partner: Not on file    Emotionally abused: Not on file    Physically abused: Not on file    Forced sexual activity: Not on file  Other Topics Concern  . Not on file  Social History Narrative   NOK is Daughter Karsten Fells          Outpatient Medications Prior to Visit  Medication Sig Dispense Refill  . acetaminophen (TYLENOL) 325 MG tablet Take 2  tablets (650 mg total) by mouth every 6 (six) hours as needed for mild pain, moderate pain or headache (or Fever >/= 101). 40 tablet 0  . aspirin 81 MG tablet Take 1 tablet (81 mg total) by mouth daily. 90 tablet 2  . cephALEXin (KEFLEX) 500 MG capsule Take 1 capsule (500 mg total) by mouth 3 (three) times daily. 21 capsule 0  . donepezil (ARICEPT) 10 MG tablet Take 1 tablet by mouth once daily with breakfast 90 tablet 0  . doxazosin (CARDURA) 4 MG tablet TAKE 1 TABLET BY MOUTH ONCE DAILY 30 tablet 0  . memantine (NAMENDA) 10 MG tablet TAKE 1 TABLET BY MOUTH TWICE DAILY 180 tablet 3  . mirtazapine (REMERON SOL-TAB) 15 MG disintegrating tablet DISSOLVE 1 TABLET BY MOUTH AT BEDTIME 30 tablet 8  . pantoprazole (PROTONIX) 40 MG tablet Take 1 tablet by mouth twice daily 180 tablet 2  . traZODone (DESYREL) 50 MG tablet TAKE 1/2 (ONE-HALF) TABLET BY  MOUTH AT BEDTIME 45 tablet 2  . triamcinolone ointment (KENALOG) 0.1 % APPLY  OINTMENT TOPICALLY TWICE DAILY 30 g 0  . allopurinol (ZYLOPRIM) 300 MG tablet Take 1 tablet by mouth once daily 90 tablet 2  . furosemide (LASIX) 20 MG tablet Take 1 tablet (20 mg total) by mouth daily. 90 tablet 1   No facility-administered medications prior to visit.     Allergies  Allergen Reactions  . Amlodipine-Atorvastatin Other (See Comments)    REACTION: LEG CRAMPS  . Benazepril Hcl Other (See Comments)    REACTION: Cough  . Ezetimibe Other (See Comments)    REACTION: edema    ROS Review of Systems  Constitutional: Negative.   Respiratory: Negative for chest tightness and shortness of breath.   Cardiovascular: Positive for leg swelling. Negative for chest pain and palpitations.  Gastrointestinal: Negative.   Genitourinary: Negative.   Skin: Negative for color change and pallor.  Allergic/Immunologic: Negative for immunocompromised state.  Neurological: Negative for seizures and facial asymmetry.  Hematological: Does not bruise/bleed easily.  Psychiatric/Behavioral: Positive for confusion. Negative for behavioral problems and decreased concentration.      Objective:    Physical Exam  Constitutional: He appears well-developed and well-nourished. He appears lethargic. No distress.  HENT:  Head: Normocephalic and atraumatic.  Right Ear: External ear normal.  Left Ear: External ear normal.  Eyes: Right eye exhibits no discharge. Left eye exhibits no discharge. No scleral icterus.  Neck: No JVD present. No tracheal deviation present.  Cardiovascular: Normal rate, regular rhythm and normal heart sounds.  Pulmonary/Chest: No stridor. He has rhonchi in the right lower field and the left lower field.    Musculoskeletal:        General: Edema present.  Neurological: He appears lethargic.  Skin: Skin is warm and dry. He is not diaphoretic.    BP 128/80   Ht 5\' 9"  (1.753 m)   Wt 174 lb 6 oz  (79.1 kg)   BMI 25.75 kg/m  Wt Readings from Last 3 Encounters:  01/17/19 174 lb 6 oz (79.1 kg)  09/02/18 158 lb 8 oz (71.9 kg)  08/30/18 158 lb 8 oz (71.9 kg)   BP Readings from Last 3 Encounters:  01/17/19 128/80  09/02/18 135/72  08/30/18 120/80   Guideline developer:  UpToDate (see UpToDate for funding source) Date Released: June 2014  Health Maintenance Due  Topic Date Due  . OPHTHALMOLOGY EXAM  01/21/2013  . FOOT EXAM  10/17/2014  . HEMOGLOBIN A1C  03/14/2017  There are no preventive care reminders to display for this patient.  Lab Results  Component Value Date   TSH 0.913 09/27/2016   Lab Results  Component Value Date   WBC 6.2 09/02/2018   HGB 10.6 (L) 09/02/2018   HCT 33.3 (L) 09/02/2018   MCV 79.1 (L) 09/02/2018   PLT 133 (L) 09/02/2018   Lab Results  Component Value Date   NA 141 09/02/2018   K 4.9 09/02/2018   CO2 25 09/02/2018   GLUCOSE 85 09/02/2018   BUN 21 09/02/2018   CREATININE 2.48 (H) 09/02/2018   BILITOT 1.3 (H) 09/02/2018   ALKPHOS 89 09/02/2018   AST 23 09/02/2018   ALT 10 09/02/2018   PROT 6.8 09/02/2018   ALBUMIN 2.7 (L) 09/02/2018   CALCIUM 8.7 (L) 09/02/2018   ANIONGAP 9 09/02/2018   GFR 42.49 (L) 05/30/2018   Lab Results  Component Value Date   CHOL 160 09/11/2016   Lab Results  Component Value Date   HDL 35.10 (L) 09/11/2016   Lab Results  Component Value Date   LDLCALC 102 (H) 09/11/2016   Lab Results  Component Value Date   TRIG 112.0 09/11/2016   Lab Results  Component Value Date   CHOLHDL 5 09/11/2016   Lab Results  Component Value Date   HGBA1C 5.1 09/11/2016      Assessment & Plan:   Problem List Items Addressed This Visit      Genitourinary   Chronic renal disease, stage III (HCC)   Relevant Orders   Basic metabolic panel     Other   Gout - Primary   Relevant Orders   Uric acid   Anemia   Relevant Orders   CBC   Iron, TIBC and Ferritin Panel   Edema   Relevant Medications    allopurinol (ZYLOPRIM) 100 MG tablet   furosemide (LASIX) 40 MG tablet   Other Relevant Orders   Basic metabolic panel      Meds ordered this encounter  Medications  . allopurinol (ZYLOPRIM) 100 MG tablet    Sig: Take 1 tablet (100 mg total) by mouth daily.    Dispense:  30 tablet    Refill:  6  . furosemide (LASIX) 40 MG tablet    Sig: Take 1 tablet (40 mg total) by mouth daily.    Dispense:  90 tablet    Refill:  1    Follow-up: No follow-ups on file.    Have increased his Lasix to 40 mg daily.  Continue compression stockings and lower sodium in diet.  Have decreased the allopurinol to 100 mg daily.  Encouraged continual use of a multivitamin with iron.  Rechecking CBC and iron levels today.  Will consider cardiology referral in the future.  Has follow-up appointment scheduled for September.

## 2019-01-18 LAB — CBC
Hematocrit: 35.3 % — ABNORMAL LOW (ref 37.5–51.0)
Hemoglobin: 10.8 g/dL — ABNORMAL LOW (ref 13.0–17.7)
MCH: 25.5 pg — ABNORMAL LOW (ref 26.6–33.0)
MCHC: 30.6 g/dL — ABNORMAL LOW (ref 31.5–35.7)
MCV: 84 fL (ref 79–97)
Platelets: 125 10*3/uL — ABNORMAL LOW (ref 150–450)
RBC: 4.23 x10E6/uL (ref 4.14–5.80)
RDW: 15.9 % — ABNORMAL HIGH (ref 11.6–15.4)
WBC: 6.8 10*3/uL (ref 3.4–10.8)

## 2019-01-18 LAB — BASIC METABOLIC PANEL
BUN/Creatinine Ratio: 11 (ref 10–24)
BUN: 23 mg/dL (ref 8–27)
CO2: 21 mmol/L (ref 20–29)
Calcium: 8.2 mg/dL — ABNORMAL LOW (ref 8.6–10.2)
Chloride: 105 mmol/L (ref 96–106)
Creatinine, Ser: 2.11 mg/dL — ABNORMAL HIGH (ref 0.76–1.27)
GFR calc Af Amer: 33 mL/min/{1.73_m2} — ABNORMAL LOW (ref >59)
GFR calc non Af Amer: 29 mL/min/{1.73_m2} — ABNORMAL LOW (ref >59)
Glucose: 89 mg/dL (ref 65–99)
Potassium: 3.8 mmol/L (ref 3.5–5.2)
Sodium: 143 mmol/L (ref 134–144)

## 2019-01-18 LAB — IRON,TIBC AND FERRITIN PANEL
Ferritin: 133 ng/mL (ref 30–400)
Iron Saturation: 21 % (ref 15–55)
Iron: 41 ug/dL (ref 38–169)
Total Iron Binding Capacity: 194 ug/dL — ABNORMAL LOW (ref 250–450)
UIBC: 153 ug/dL (ref 111–343)

## 2019-01-19 LAB — SPECIMEN STATUS REPORT

## 2019-01-19 LAB — URIC ACID: Uric Acid: 3.7 mg/dL (ref 3.7–8.6)

## 2019-01-20 ENCOUNTER — Encounter: Payer: Self-pay | Admitting: Family Medicine

## 2019-01-29 ENCOUNTER — Other Ambulatory Visit: Payer: Self-pay

## 2019-01-29 ENCOUNTER — Ambulatory Visit (INDEPENDENT_AMBULATORY_CARE_PROVIDER_SITE_OTHER): Payer: Medicare Other | Admitting: Psychiatry

## 2019-01-29 DIAGNOSIS — F0391 Unspecified dementia with behavioral disturbance: Secondary | ICD-10-CM | POA: Diagnosis not present

## 2019-01-29 MED ORDER — HYDROXYZINE PAMOATE 25 MG PO CAPS: ORAL_CAPSULE | ORAL | 5 refills | Status: DC

## 2019-01-29 MED ORDER — MIRTAZAPINE 15 MG PO TBDP: ORAL_TABLET | ORAL | 8 refills | Status: DC

## 2019-01-29 NOTE — Progress Notes (Signed)
Psychiatric Initial Adult Assessment   Patient Identification: Anthony [name] MRN:  [id number] Date of Evaluation:  01/29/2019 Referral Source: Dr. Fredrik Rigger Chief Complaint:   Visit Diagnosi dementia with depression  History of Present Illness:   Today I spoke to the patient and his daughter Levada Dy.  Generally the patient is doing fairly well.  His only new issue is at night he becomes more restless and starts picking at himself.  This then leads to itching.  It ends up keeping him up much of the night.  Is not clear what he is itching at but is clear that if he starts itching and he scratches himself, which he does he then has a cycle of itching.  The patient has no evidence of psychosis.  He makes no efforts to leave/elope.  The patient is incontinent of urine but not of stool.  He is not agitated.  He takes his Remeron as prescribed.  Generally he is cooperative and compliant.  His family says that they get him outside he does better.  He does better if he is somewhat mildly stimulated.  He still is able to walk and has had no falls.  He does not crying.  He does not act like he is hearing voices or seeing visions.  He is not paranoid.  He is cooperative with his family.  Levada Dy and his daughter Jana Half are involved in his care.  Unfortunately he could never start up with [facility] because of the pandemic.  A consideration is possible to refer [facility] to [facility] for [facility].  The other option is to consider [facility].  Associated Signs/Symptoms: Depression Symptoms:  weight loss, (Hypo) Manic Symptoms:   Anxiety Symptoms:   Psychotic Symptoms:   PTSD Symptoms:   Past Psychiatric History: none  Previous Psychotropic Medications:   Substance Abuse History in the last 12 months:    Consequences of Substance Abuse:   Past Medical History:  Past Medical History:  Diagnosis Date  . ABNORMAL ELECTROCARDIOGRAM 05/03/2010  . Alzheimer disease ([place])    . GALLSTONES 03/15/2007  . GOUT 03/15/2007  . HYPERLIPIDEMIA 03/15/2007  . HYPERTENSION 03/15/2007  . NEPHROPATHY, DIABETIC 03/15/2007  . RENAL INSUFFICIENCY 04/30/2009  . SHOULDER PAIN, BILATERAL 04/29/2008  . UROLITHIASIS, HX OF 03/15/2007    Past Surgical History:  Procedure Laterality Date  . NASAL SEPTUM SURGERY  1978    Family Psychiatric History:   Family History:  Family History  Problem Relation Age of Onset  . Cancer Father        Prostate Cancer  . Colon cancer Neg Hx   . Esophageal cancer Neg Hx   . Stomach cancer Neg Hx     Social History:   Social History   Socioeconomic History  . Marital status: Divorced    Spouse name: Not on file  . Number of children: Not on file  . Years of education: Not on file  . Highest education level: Not on file  Occupational History  . Occupation: Retired  Scientific laboratory technician  . Financial resource strain: Not on file  . Food insecurity    Worry: Not on file    Inability: Not on file  . Transportation needs    Medical: Not on file    Non-medical: Not on file  Tobacco Use  . Smoking status: Never Smoker  . Smokeless tobacco: Never Used  Substance and Sexual Activity  . Alcohol use: No    Alcohol/week: 0.0 standard drinks  .  Drug use: No  . Sexual activity: Never  Lifestyle  . Physical activity    Days per week: Not on file    Minutes per session: Not on file  . Stress: Not on file  Relationships  . Social Musicianconnections    Talks on phone: Not on file    Gets together: Not on file    Attends religious service: Not on file    Active member of club or organization: Not on file    Attends meetings of clubs or organizations: Not on file    Relationship status: Not on file  Other Topics Concern  . Not on file  Social History Narrative   NOK is Daughter Sharol GivenAngelia Vogler          Additional Social History:   Allergies:   Allergies  Allergen Reactions  . Amlodipine-Atorvastatin Other (See Comments)    REACTION: LEG  CRAMPS  . Benazepril Hcl Other (See Comments)    REACTION: Cough  . Ezetimibe Other (See Comments)    REACTION: edema    Metabolic Disorder Labs: Lab Results  Component Value Date   HGBA1C 5.1 09/11/2016   MPG 114 09/02/2013   No results found for: PROLACTIN Lab Results  Component Value Date   CHOL 160 09/11/2016   TRIG 112.0 09/11/2016   HDL 35.10 (L) 09/11/2016   CHOLHDL 5 09/11/2016   VLDL 22.4 09/11/2016   LDLCALC 102 (H) 09/11/2016   LDLCALC 94 09/10/2015     Current Medications: Current Outpatient Medications  Medication Sig Dispense Refill  . acetaminophen (TYLENOL) 325 MG tablet Take 2 tablets (650 mg total) by mouth every 6 (six) hours as needed for mild pain, moderate pain or headache (or Fever >/= 101). 40 tablet 0  . allopurinol (ZYLOPRIM) 100 MG tablet Take 1 tablet (100 mg total) by mouth daily. 30 tablet 6  . aspirin 81 MG tablet Take 1 tablet (81 mg total) by mouth daily. 90 tablet 2  . cephALEXin (KEFLEX) 500 MG capsule Take 1 capsule (500 mg total) by mouth 3 (three) times daily. 21 capsule 0  . donepezil (ARICEPT) 10 MG tablet Take 1 tablet by mouth once daily with breakfast 90 tablet 0  . doxazosin (CARDURA) 4 MG tablet TAKE 1 TABLET BY MOUTH ONCE DAILY 30 tablet 0  . furosemide (LASIX) 40 MG tablet Take 1 tablet (40 mg total) by mouth daily. 90 tablet 1  . hydrOXYzine (VISTARIL) 25 MG capsule 1  qhs  May repeat in 30 minutes if still distressed 60 capsule 5  . memantine (NAMENDA) 10 MG tablet TAKE 1 TABLET BY MOUTH TWICE DAILY 180 tablet 3  . mirtazapine (REMERON SOL-TAB) 15 MG disintegrating tablet DISSOLVE 1 TABLET BY MOUTH AT BEDTIME 30 tablet 8  . pantoprazole (PROTONIX) 40 MG tablet Take 1 tablet by mouth twice daily 180 tablet 2  . traZODone (DESYREL) 50 MG tablet TAKE 1/2 (ONE-HALF) TABLET BY MOUTH AT BEDTIME 45 tablet 2  . triamcinolone ointment (KENALOG) 0.1 % APPLY  OINTMENT TOPICALLY TWICE DAILY 30 g 0   No current facility-administered  medications for this visit.     Neurologic: Headache: No Seizure: No Paresthesias:No  Musculoskeletal: Strength & Muscle Tone: decreased Gait & Station: unsteady Patient leans: Backward and N/A  Psychiatric Specialty Exam: ROS  There were no vitals taken for this visit.There is no height or weight on file to calculate BMI.  General Appearance: Casual  Eye Contact:  Fair  Speech:  Slurred  Volume:  Decreased  Mood:  Euthymic and Hopeless  Affect:  Congruent  Thought Process:  Disorganized  Orientation:  Negative  Thought Content:  WDL  Suicidal Thoughts:  No  Homicidal Thoughts:  No  Memory: absent short median long-term memory  Judgement:  Impaired  Insight:  Lacking  Psychomotor Activity:  Decreased  Concentration:    Recall:  Poor  Fund of Knowledge:Poor  Language: Poor  Akathisia:  No  Handed:  Right  AIMS (if indicated):    Assets:  Desire for Improvement  ADL's:  Impaired  Cognition: Impaired,  Severe  Sleep:  Normal     Treatment Plan Summary: This patient is #1 problem is that of end-stage dementia.  He still can recognize his family and apparently still can feed himself.  At this time we will continue his Remeron 30 mg full tab but will add Vistaril 25 mg that will help him sleep and perhaps help any dermatological complaints like itching.  He does not calmer after 30 minutes I recommend he repeat the Vistaril.  Today was a phone discussion.  When his next visit in 5 months ideally it will be in person.  The patient is not suicidal or dangerous to himself or to others. Gypsy BalsamGerald I Haakon Titsworth, MD 8/12/20203:54 PM

## 2019-02-06 ENCOUNTER — Telehealth (HOSPITAL_COMMUNITY): Payer: Self-pay

## 2019-02-06 NOTE — Telephone Encounter (Signed)
Patient's daughter called regarding his Hydroxyzine 25mg . We did a prior authorization and it was denied so we have appealed it. In the meantime, per Silvio Pate, I called [facility] on [address] and they ran it through with no insurance, paying out of pocket, and using the good rx card for this refill. The cost only came to $8.31. The patient's daughter was agreeable. Thank you.

## 2019-02-23 ENCOUNTER — Other Ambulatory Visit: Payer: Self-pay | Admitting: Neurology

## 2019-02-23 DIAGNOSIS — F0390 Unspecified dementia without behavioral disturbance: Secondary | ICD-10-CM

## 2019-02-23 DIAGNOSIS — F03B0 Unspecified dementia, moderate, without behavioral disturbance, psychotic disturbance, mood disturbance, and anxiety: Secondary | ICD-10-CM

## 2019-03-03 ENCOUNTER — Telehealth: Payer: Self-pay

## 2019-03-03 NOTE — Telephone Encounter (Signed)

## 2019-03-04 ENCOUNTER — Telehealth: Payer: Self-pay

## 2019-03-04 ENCOUNTER — Ambulatory Visit: Payer: Medicare Other | Admitting: Family Medicine

## 2019-03-04 ENCOUNTER — Encounter: Payer: Self-pay | Admitting: Family Medicine

## 2019-03-04 ENCOUNTER — Other Ambulatory Visit: Payer: Self-pay

## 2019-03-04 VITALS — BP 136/80 | HR 54 | Ht 69.0 in

## 2019-03-04 DIAGNOSIS — Z23 Encounter for immunization: Secondary | ICD-10-CM | POA: Diagnosis not present

## 2019-03-04 DIAGNOSIS — M1A9XX0 Chronic gout, unspecified, without tophus (tophi): Secondary | ICD-10-CM

## 2019-03-04 DIAGNOSIS — N183 Chronic kidney disease, stage 3 unspecified: Secondary | ICD-10-CM

## 2019-03-04 DIAGNOSIS — R609 Edema, unspecified: Secondary | ICD-10-CM | POA: Diagnosis not present

## 2019-03-04 NOTE — Progress Notes (Signed)
Established Patient Office Visit  Subjective:  Patient ID: Anthony [name], male    DOB: 07-Mar-1936  Age: 83 y.o. MRN: [id number]  CC:  Chief Complaint  Patient presents with  . Follow-up    HPI Anthony [name] presents for follow-up of his edema and gout and CKD.  Edema has responded well to the increased Lasix.  He is consuming a banana each day.  He has had no problem with joint pain or swelling since decreasing the dose of allopurinol.  Psychiatry added PRN Vistaril at at bedtime as needed for scratching.  Past Medical History:  Diagnosis Date  . ABNORMAL ELECTROCARDIOGRAM 05/03/2010  . Alzheimer disease (HCC)   . GALLSTONES 03/15/2007  . GOUT 03/15/2007  . HYPERLIPIDEMIA 03/15/2007  . HYPERTENSION 03/15/2007  . NEPHROPATHY, DIABETIC 03/15/2007  . RENAL INSUFFICIENCY 04/30/2009  . SHOULDER PAIN, BILATERAL 04/29/2008  . UROLITHIASIS, HX OF 03/15/2007    Past Surgical History:  Procedure Laterality Date  . NASAL SEPTUM SURGERY  1978    Family History  Problem Relation Age of Onset  . Cancer Father        Prostate Cancer  . Colon cancer Neg Hx   . Esophageal cancer Neg Hx   . Stomach cancer Neg Hx     Social History   Socioeconomic History  . Marital status: Divorced    Spouse name: Not on file  . Number of children: Not on file  . Years of education: Not on file  . Highest education level: Not on file  Occupational History  . Occupation: Retired  Engineer, productionocial Needs  . Financial resource strain: Not on file  . Food insecurity    Worry: Not on file    Inability: Not on file  . Transportation needs    Medical: Not on file    Non-medical: Not on file  Tobacco Use  . Smoking status: Never Smoker  . Smokeless tobacco: Never Used  Substance and Sexual Activity  . Alcohol use: No    Alcohol/week: 0.0 standard drinks  . Drug use: No  . Sexual activity: Never  Lifestyle  . Physical activity    Days per week: Not on file    Minutes per session: Not on file  .  Stress: Not on file  Relationships  . Social Musicianconnections    Talks on phone: Not on file    Gets together: Not on file    Attends religious service: Not on file    Active member of club or organization: Not on file    Attends meetings of clubs or organizations: Not on file    Relationship status: Not on file  . Intimate partner violence    Fear of current or ex partner: Not on file    Emotionally abused: Not on file    Physically abused: Not on file    Forced sexual activity: Not on file  Other Topics Concern  . Not on file  Social History Narrative   NOK is Daughter Sharol GivenAngelia Mcgeehan          Outpatient Medications Prior to Visit  Medication Sig Dispense Refill  . acetaminophen (TYLENOL) 325 MG tablet Take 2 tablets (650 mg total) by mouth every 6 (six) hours as needed for mild pain, moderate pain or headache (or Fever >/= 101). 40 tablet 0  . allopurinol (ZYLOPRIM) 100 MG tablet Take 1 tablet (100 mg total) by mouth daily. 30 tablet 6  . aspirin 81 MG tablet Take 1 tablet (81  mg total) by mouth daily. 90 tablet 2  . cephALEXin (KEFLEX) 500 MG capsule Take 1 capsule (500 mg total) by mouth 3 (three) times daily. 21 capsule 0  . donepezil (ARICEPT) 10 MG tablet Take 1 tablet by mouth once daily with breakfast 90 tablet 1  . doxazosin (CARDURA) 4 MG tablet TAKE 1 TABLET BY MOUTH ONCE DAILY 30 tablet 0  . furosemide (LASIX) 40 MG tablet Take 1 tablet (40 mg total) by mouth daily. 90 tablet 1  . hydrOXYzine (VISTARIL) 25 MG capsule 1  qhs  May repeat in 30 minutes if still distressed 60 capsule 5  . memantine (NAMENDA) 10 MG tablet TAKE 1 TABLET BY MOUTH TWICE DAILY 180 tablet 3  . mirtazapine (REMERON SOL-TAB) 15 MG disintegrating tablet DISSOLVE 1 TABLET BY MOUTH AT BEDTIME 30 tablet 8  . pantoprazole (PROTONIX) 40 MG tablet Take 1 tablet by mouth twice daily 180 tablet 2  . traZODone (DESYREL) 50 MG tablet TAKE 1/2 (ONE-HALF) TABLET BY MOUTH AT BEDTIME 45 tablet 2  . triamcinolone  ointment (KENALOG) 0.1 % APPLY  OINTMENT TOPICALLY TWICE DAILY 30 g 0   No facility-administered medications prior to visit.     Allergies  Allergen Reactions  . Amlodipine-Atorvastatin Other (See Comments)    REACTION: LEG CRAMPS  . Benazepril Hcl Other (See Comments)    REACTION: Cough  . Ezetimibe Other (See Comments)    REACTION: edema    ROS Review of Systems  Constitutional: Negative for chills, diaphoresis, fatigue, fever and unexpected weight change.  HENT: Negative.   Eyes: Negative for photophobia and visual disturbance.  Respiratory: Negative.   Cardiovascular: Negative.   Gastrointestinal: Negative.   Endocrine: Negative for polyphagia and polyuria.  Genitourinary: Negative for difficulty urinating.  Musculoskeletal: Positive for gait problem. Negative for arthralgias.  Skin: Negative for color change and pallor.  Neurological: Positive for speech difficulty and weakness.  Hematological: Negative.   Psychiatric/Behavioral: Positive for sleep disturbance. The patient is not nervous/anxious.       Objective:    Physical Exam  Constitutional: He appears well-developed and well-nourished. He appears lethargic. No distress.  HENT:  Head: Atraumatic.  Right Ear: External ear normal.  Left Ear: External ear normal.  Eyes: Right eye exhibits no discharge. Left eye exhibits no discharge. No scleral icterus.  Neck: No JVD present. No tracheal deviation present.  Cardiovascular: Normal rate, regular rhythm and normal heart sounds.  Pulmonary/Chest: Effort normal and breath sounds normal. No stridor. No respiratory distress. He has no wheezes. He has no rales.  Musculoskeletal:     Left lower leg: No edema.  Neurological: He appears lethargic.  Skin: Skin is warm and dry. He is not diaphoretic.    BP 136/80   Pulse (!) 54   Ht 5\' 9"  (1.753 m)   SpO2 96%   BMI 25.75 kg/m  Wt Readings from Last 3 Encounters:  01/17/19 174 lb 6 oz (79.1 kg)  09/02/18 158 lb 8  oz (71.9 kg)  08/30/18 158 lb 8 oz (71.9 kg)   BP Readings from Last 3 Encounters:  03/04/19 136/80  01/17/19 128/80  09/02/18 135/72   Guideline developer:  UpToDate (see UpToDate for funding source) Date Released: June 2014  Health Maintenance Due  Topic Date Due  . OPHTHALMOLOGY EXAM  01/21/2013  . FOOT EXAM  10/17/2014  . HEMOGLOBIN A1C  03/14/2017  . INFLUENZA VACCINE  01/18/2019    There are no preventive care reminders to display for this  patient.  Lab Results  Component Value Date   TSH 0.913 09/27/2016   Lab Results  Component Value Date   WBC 6.8 01/17/2019   HGB 10.8 (L) 01/17/2019   HCT 35.3 (L) 01/17/2019   MCV 84 01/17/2019   PLT 125 (L) 01/17/2019   Lab Results  Component Value Date   NA 143 01/17/2019   K 3.8 01/17/2019   CO2 21 01/17/2019   GLUCOSE 89 01/17/2019   BUN 23 01/17/2019   CREATININE 2.11 (H) 01/17/2019   BILITOT 1.3 (H) 09/02/2018   ALKPHOS 89 09/02/2018   AST 23 09/02/2018   ALT 10 09/02/2018   PROT 6.8 09/02/2018   ALBUMIN 2.7 (L) 09/02/2018   CALCIUM 8.2 (L) 01/17/2019   ANIONGAP 9 09/02/2018   GFR 42.49 (L) 05/30/2018   Lab Results  Component Value Date   CHOL 160 09/11/2016   Lab Results  Component Value Date   HDL 35.10 (L) 09/11/2016   Lab Results  Component Value Date   LDLCALC 102 (H) 09/11/2016   Lab Results  Component Value Date   TRIG 112.0 09/11/2016   Lab Results  Component Value Date   CHOLHDL 5 09/11/2016   Lab Results  Component Value Date   HGBA1C 5.1 09/11/2016      Assessment & Plan:   Problem List Items Addressed This Visit      Genitourinary   Chronic renal disease, stage III (HCC)   Relevant Orders   Uric acid     Other   Gout - Primary   Relevant Orders   Basic metabolic panel   Edema      No orders of the defined types were placed in this encounter.   Follow-up: Return in about 3 months (around 06/03/2019), or if symptoms worsen or fail to improve.   Follow-up on  uric acid level status post decrease of allopurinol today.  Hopefully GFR will be improved.  Continue higher dose of Lasix for now.Marland Kitchen  He will continue consuming a banana daily.  Follow-up in 3 months unless blood work necessitates earlier visit.

## 2019-03-04 NOTE — Addendum Note (Signed)
Addended by: Lynnea Ferrier on: 03/04/2019 02:45 PM   Modules accepted: Orders

## 2019-03-04 NOTE — Telephone Encounter (Signed)
Pt had blood drawn today. During the draw, he kept wanting to bend his arm and kept squeezing the chair. His daughter was helping keep his arm extended.  He was not combative.  After centrifuging, the serum was hemolyzed. This was probably caused by the pt having a tight grip on the chair and his arm being held in an extended position It will be rejected by the main lab. I called pt's daugher/guardian, Angelia. I explained what happened. She stated she will bring her father back in a couple of days.  Levada Dy

## 2019-03-05 ENCOUNTER — Ambulatory Visit: Payer: Self-pay | Admitting: Family Medicine

## 2019-03-11 ENCOUNTER — Encounter: Payer: Self-pay | Admitting: Neurology

## 2019-03-13 ENCOUNTER — Telehealth (INDEPENDENT_AMBULATORY_CARE_PROVIDER_SITE_OTHER): Payer: Medicare Other | Admitting: Neurology

## 2019-03-13 ENCOUNTER — Other Ambulatory Visit: Payer: Self-pay

## 2019-03-13 VITALS — Ht 69.0 in | Wt 176.0 lb

## 2019-03-13 DIAGNOSIS — F0390 Unspecified dementia without behavioral disturbance: Secondary | ICD-10-CM

## 2019-03-13 DIAGNOSIS — F03C0 Unspecified dementia, severe, without behavioral disturbance, psychotic disturbance, mood disturbance, and anxiety: Secondary | ICD-10-CM

## 2019-03-13 MED ORDER — MEMANTINE HCL 10 MG PO TABS: 10.0000 mg | ORAL_TABLET | Freq: Two times a day (BID) | ORAL | 3 refills | Status: DC

## 2019-03-13 MED ORDER — DONEPEZIL HCL 10 MG PO TABS: ORAL_TABLET | ORAL | 3 refills | Status: DC
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
## 2019-03-25 ENCOUNTER — Other Ambulatory Visit: Payer: Self-pay | Admitting: Endocrinology

## 2019-03-25 NOTE — Telephone Encounter (Signed)
Please forward refill request to pt's primary care provider.   

## 2019-03-25 NOTE — Telephone Encounter (Signed)
Per Dr. Ellison's request, I am forwarding you this refill request. Please review and refill if appropriate 

## 2019-05-04 ENCOUNTER — Other Ambulatory Visit: Payer: Self-pay | Admitting: Family Medicine

## 2019-06-26 ENCOUNTER — Other Ambulatory Visit: Payer: Self-pay | Admitting: Family Medicine

## 2019-06-26 DIAGNOSIS — R609 Edema, unspecified: Secondary | ICD-10-CM

## 2019-06-26 NOTE — Telephone Encounter (Signed)
Pt need follow up on medication. Daughter also states that she would like for dry patch on skin to be checked.

## 2019-07-02 ENCOUNTER — Ambulatory Visit (INDEPENDENT_AMBULATORY_CARE_PROVIDER_SITE_OTHER): Payer: Medicare PPO | Admitting: Psychiatry

## 2019-07-02 ENCOUNTER — Other Ambulatory Visit: Payer: Self-pay

## 2019-07-02 DIAGNOSIS — F329 Major depressive disorder, single episode, unspecified: Secondary | ICD-10-CM

## 2019-07-02 DIAGNOSIS — F0393 Unspecified dementia, unspecified severity, with mood disturbance: Secondary | ICD-10-CM

## 2019-07-02 DIAGNOSIS — F0390 Unspecified dementia without behavioral disturbance: Secondary | ICD-10-CM | POA: Diagnosis not present

## 2019-07-02 MED ORDER — MIRTAZAPINE 15 MG PO TBDP: ORAL_TABLET | ORAL | 8 refills | Status: DC

## 2019-07-02 NOTE — Progress Notes (Signed)
Psychiatric Initial Adult Assessment   Patient Identification: Anthony [name] MRN:  [id number] Date of Evaluation:  07/02/2019 Referral Source: Dr. Fredrik Rigger Chief Complaint:   Visit Diagnosi dementia with depression  History of Present Illness:    Today we got to speak to Levada Dy who is the daughter and the caregiver for this patient.  According to Levada Dy the patient is doing fairly well.  The patient is somewhat incontinent of urine.  Patient is now ambulatory as long as he uses a walker.  According to the family he is sleeping and eating fairly well.  He is calm cooperative and does not appear distressed in any way.  He was having trouble with excessive itching and took Vistaril for a few weeks.  Now the patient is no longer itching and the Vistaril has been stopped.  Patient shows no evidence of agitation.  She has no evidence of eloping.  At this time it is difficult to imagine that he does recognize family.  He has difficulty telling a stranger from a family member.  He does not make his needs known.  The family anticipates all of his needs.  They keep him out of bed all day long walking him around.  The patient is comfortable.  He does seem to have a skin lesion and is going to see Dr. Genevive Bi his primary care doctor in the next week.  Overall the patient is doing well. Associated Signs/Symptoms: Depression Symptoms:  weight loss, (Hypo) Manic Symptoms:   Anxiety Symptoms:   Psychotic Symptoms:   PTSD Symptoms:   Past Psychiatric History: none  Previous Psychotropic Medications:   Substance Abuse History in the last 12 months:    Consequences of Substance Abuse:   Past Medical History:  Past Medical History:  Diagnosis Date  . ABNORMAL ELECTROCARDIOGRAM 05/03/2010  . Alzheimer disease ([place])   . GALLSTONES 03/15/2007  . GOUT 03/15/2007  . HYPERLIPIDEMIA 03/15/2007  . HYPERTENSION 03/15/2007  . NEPHROPATHY, DIABETIC 03/15/2007  . RENAL INSUFFICIENCY 04/30/2009  . SHOULDER  PAIN, BILATERAL 04/29/2008  . UROLITHIASIS, HX OF 03/15/2007    Past Surgical History:  Procedure Laterality Date  . NASAL SEPTUM SURGERY  1978    Family Psychiatric History:   Family History:  Family History  Problem Relation Age of Onset  . Cancer Father        Prostate Cancer  . Colon cancer Neg Hx   . Esophageal cancer Neg Hx   . Stomach cancer Neg Hx     Social History:   Social History   Socioeconomic History  . Marital status: Divorced    Spouse name: Not on file  . Number of children: Not on file  . Years of education: Not on file  . Highest education level: Not on file  Occupational History  . Occupation: Retired  Tobacco Use  . Smoking status: Never Smoker  . Smokeless tobacco: Never Used  Substance and Sexual Activity  . Alcohol use: No    Alcohol/week: 0.0 standard drinks  . Drug use: No  . Sexual activity: Never  Other Topics Concern  . 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 Difficulty of Paying Living Expenses: Not on file  Food Insecurity:   . Worried About Charity fundraiser in the Last Year: Not on file  . Ran Out of Food in  the Last Year: Not on file  Transportation Needs:   . Lack of Transportation (Medical): Not on file  . Lack of Transportation (Non-Medical): Not on file  Physical Activity:   . Days of Exercise per Week: Not on file  . Minutes of Exercise per Session: Not on file  Stress:   . Feeling of Stress : Not on file  Social Connections:   . Frequency of Communication with Friends and Family: Not on file  . Frequency of Social Gatherings with Friends and Family: Not on file  . Attends Religious Services: Not on file  . Active Member of Clubs or Organizations: Not on file  . Attends Banker Meetings: Not on file  . Marital Status: Not on file    Additional Social History:   Allergies:   Allergies   Allergen Reactions  . Amlodipine-Atorvastatin Other (See Comments)    REACTION: LEG CRAMPS  . Benazepril Hcl Other (See Comments)    REACTION: Cough  . Ezetimibe Other (See Comments)    REACTION: edema    Metabolic Disorder Labs: Lab Results  Component Value Date   HGBA1C 5.1 09/11/2016   MPG 114 09/02/2013   No results found for: PROLACTIN Lab Results  Component Value Date   CHOL 160 09/11/2016   TRIG 112.0 09/11/2016   HDL 35.10 (L) 09/11/2016   CHOLHDL 5 09/11/2016   VLDL 22.4 09/11/2016   LDLCALC 102 (H) 09/11/2016   LDLCALC 94 09/10/2015     Current Medications: Current Outpatient Medications  Medication Sig Dispense Refill  . acetaminophen (TYLENOL) 325 MG tablet Take 2 tablets (650 mg total) by mouth every 6 (six) hours as needed for mild pain, moderate pain or headache (or Fever >/= 101). 40 tablet 0  . allopurinol (ZYLOPRIM) 100 MG tablet Take 1 tablet (100 mg total) by mouth daily. 30 tablet 6  . aspirin 81 MG tablet Take 1 tablet (81 mg total) by mouth daily. 90 tablet 2  . cephALEXin (KEFLEX) 500 MG capsule Take 1 capsule (500 mg total) by mouth 3 (three) times daily. 21 capsule 0  . donepezil (ARICEPT) 10 MG tablet Take 1 tablet by mouth once daily with breakfast 90 tablet 3  . doxazosin (CARDURA) 4 MG tablet Take 1 tablet by mouth once daily 30 tablet 2  . furosemide (LASIX) 40 MG tablet Take 1 tablet (40 mg total) by mouth daily. 90 tablet 1  . memantine (NAMENDA) 10 MG tablet Take 1 tablet (10 mg total) by mouth 2 (two) times daily. 180 tablet 3  . mirtazapine (REMERON SOL-TAB) 15 MG disintegrating tablet DISSOLVE 1 TABLET BY MOUTH AT BEDTIME 30 tablet 8  . pantoprazole (PROTONIX) 40 MG tablet Take 1 tablet by mouth twice daily 180 tablet 2  . traZODone (DESYREL) 50 MG tablet TAKE 1/2 (ONE-HALF) TABLET BY MOUTH AT BEDTIME 45 tablet 2  . triamcinolone ointment (KENALOG) 0.1 % APPLY  OINTMENT TOPICALLY TWICE DAILY 30 g 0   No current facility-administered  medications for this visit.    Neurologic: Headache: No Seizure: No Paresthesias:No  Musculoskeletal: Strength & Muscle Tone: decreased Gait & Station: unsteady Patient leans: Backward and N/A  Psychiatric Specialty Exam: ROS  There were no vitals taken for this visit.There is no height or weight on file to calculate BMI.  General Appearance: Casual  Eye Contact:  Fair  Speech:  Slurred  Volume:  Decreased  Mood:  Euthymic and Hopeless  Affect:  Congruent  Thought Process:  Disorganized  Orientation:  Negative  Thought Content:  WDL  Suicidal Thoughts:  No  Homicidal Thoughts:  No  Memory: absent short median long-term memory  Judgement:  Impaired  Insight:  Lacking  Psychomotor Activity:  Decreased  Concentration:    Recall:  Poor  Fund of Knowledge:Poor  Language: Poor  Akathisia:  No  Handed:  Right  AIMS (if indicated):    Assets:  Desire for Improvement  ADL's:  Impaired  Cognition: Impaired,  Severe  Sleep:  Normal     Treatment Plan Summary: 1/13/20212:07 PM   This patient is #1 problem is that of end-stage dementia.  He is being treated for this condition by his primary care doctor.  I am treating him for his second problem which is clinical depression.  He takes Remeron and has a calming effect.  It also stimulates his appetite.  This patient is stable.  Will be seen again in 6 months.

## 2019-07-03 ENCOUNTER — Other Ambulatory Visit: Payer: Self-pay

## 2019-07-03 ENCOUNTER — Other Ambulatory Visit: Payer: Self-pay | Admitting: Family Medicine

## 2019-07-03 DIAGNOSIS — R609 Edema, unspecified: Secondary | ICD-10-CM

## 2019-07-03 NOTE — Telephone Encounter (Signed)
Pt has an appointment on 07/04/19 with Dr. Doreene Burke, per daughter pt okay until his appointment.

## 2019-07-04 ENCOUNTER — Encounter: Payer: Self-pay | Admitting: Family Medicine

## 2019-07-04 ENCOUNTER — Ambulatory Visit (INDEPENDENT_AMBULATORY_CARE_PROVIDER_SITE_OTHER): Payer: Medicare PPO | Admitting: Family Medicine

## 2019-07-04 VITALS — BP 110/80 | HR 72 | Temp 96.8°F

## 2019-07-04 DIAGNOSIS — F03C0 Unspecified dementia, severe, without behavioral disturbance, psychotic disturbance, mood disturbance, and anxiety: Secondary | ICD-10-CM

## 2019-07-04 DIAGNOSIS — L89223 Pressure ulcer of left hip, stage 3: Secondary | ICD-10-CM

## 2019-07-04 DIAGNOSIS — F0390 Unspecified dementia without behavioral disturbance: Secondary | ICD-10-CM | POA: Diagnosis not present

## 2019-07-04 MED ORDER — "SILVERSEAL HYDROGEL DRESSING 4""X5"" EX PADS": MEDICATED_PAD | CUTANEOUS | 5 refills | Status: AC

## 2019-07-04 MED ORDER — TRAMADOL HCL 50 MG PO TABS: 50.0000 mg | ORAL_TABLET | Freq: Four times a day (QID) | ORAL | 0 refills | Status: AC | PRN

## 2019-07-04 NOTE — Progress Notes (Addendum)
Established Patient Office Visit  Subjective:  Patient ID: Anthony [name], male    DOB: Jun 26, 1935  Age: 84 y.o. MRN: [id number]  CC:  Chief Complaint  Patient presents with  . Skin Problem    She is also C/O dry skin. What started out as a dry patch on the left hip has turned into an ulcer. Wednesday daughter called EMS as she didn't know what to do and they said they couldn't help him that he would have to go to a wound specialist. Daughter cries saying that a dry patch has started on the right hip now.  Last week he was walking and now he is not walking or anything.    HPI Anthony [name] presents for evaluation and treatment of an ulcer on his left hip that was recently discovered this past week.  Patient has been semiambulatory for the longest time requiring assistance for any movement through the house.  He is essentially housebound.  He requires assistance with all activities of daily living.  Dementia has been slowly progressive he appears to be in the final stages.  His daughter has taken excellent care of him.  She is upset about the appearance of this ulcer on his left hip.  Patient has been eating and drinking fluids with assistance.  He is incontinent of urine and stool.  Past Medical History:  Diagnosis Date  . ABNORMAL ELECTROCARDIOGRAM 05/03/2010  . Alzheimer disease (HCC)   . GALLSTONES 03/15/2007  . GOUT 03/15/2007  . HYPERLIPIDEMIA 03/15/2007  . HYPERTENSION 03/15/2007  . NEPHROPATHY, DIABETIC 03/15/2007  . RENAL INSUFFICIENCY 04/30/2009  . SHOULDER PAIN, BILATERAL 04/29/2008  . UROLITHIASIS, HX OF 03/15/2007    Past Surgical History:  Procedure Laterality Date  . NASAL SEPTUM SURGERY  1978    Family History  Problem Relation Age of Onset  . Cancer Father        Prostate Cancer  . Colon cancer Neg Hx   . Esophageal cancer Neg Hx   . Stomach cancer Neg Hx     Social History   Socioeconomic History  . Marital status: Divorced    Spouse name: Not on  file  . Number of children: Not on file  . Years of education: Not on file  . Highest education level: Not on file  Occupational History  . Occupation: Retired  Tobacco Use  . Smoking status: Never Smoker  . Smokeless tobacco: Never Used  Substance and Sexual Activity  . Alcohol use: No    Alcohol/week: 0.0 standard drinks  . Drug use: No  . Sexual activity: Never  Other Topics Concern  . 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 Difficulty of Paying Living Expenses: Not on file  Food Insecurity:   . Worried About Programme researcher, broadcasting/film/video in the Last Year: Not on file  . Ran Out of Food in the Last Year: Not on file  Transportation Needs:   . Lack of Transportation (Medical): Not on file  . Lack of Transportation (Non-Medical): Not on file  Physical Activity:   . Days of Exercise per Week: Not on file  . Minutes of Exercise per Session: Not on file  Stress:   . Feeling of Stress : Not on file  Social Connections:   . Frequency of Communication with Friends and Family: Not on file  .  Frequency of Social Gatherings with Friends and Family: Not on file  . Attends Religious Services: Not on file  . Active Member of Clubs or Organizations: Not on file  . Attends Archivist Meetings: Not on file  . Marital Status: Not on file  Intimate Partner Violence:   . Fear of Current or Ex-Partner: Not on file  . Emotionally Abused: Not on file  . Physically Abused: Not on file  . Sexually Abused: Not on file    Outpatient Medications Prior to Visit  Medication Sig Dispense Refill  . acetaminophen (TYLENOL) 325 MG tablet Take 2 tablets (650 mg total) by mouth every 6 (six) hours as needed for mild pain, moderate pain or headache (or Fever >/= 101). 40 tablet 0  . allopurinol (ZYLOPRIM) 100 MG tablet Take 1 tablet (100 mg total) by mouth daily. 30 tablet 6  . aspirin 81 MG  tablet Take 1 tablet (81 mg total) by mouth daily. 90 tablet 2  . donepezil (ARICEPT) 10 MG tablet Take 1 tablet by mouth once daily with breakfast 90 tablet 3  . doxazosin (CARDURA) 4 MG tablet Take 1 tablet by mouth once daily 30 tablet 2  . furosemide (LASIX) 40 MG tablet Take 1 tablet by mouth once daily 90 tablet 0  . memantine (NAMENDA) 10 MG tablet Take 1 tablet (10 mg total) by mouth 2 (two) times daily. 180 tablet 3  . mirtazapine (REMERON SOL-TAB) 15 MG disintegrating tablet DISSOLVE 1 TABLET BY MOUTH AT BEDTIME 30 tablet 8  . pantoprazole (PROTONIX) 40 MG tablet Take 1 tablet by mouth twice daily 180 tablet 2  . triamcinolone ointment (KENALOG) 0.1 % APPLY  OINTMENT TOPICALLY TWICE DAILY 30 g 0  . cephALEXin (KEFLEX) 500 MG capsule Take 1 capsule (500 mg total) by mouth 3 (three) times daily. 21 capsule 0  . traZODone (DESYREL) 50 MG tablet TAKE 1/2 (ONE-HALF) TABLET BY MOUTH AT BEDTIME 45 tablet 2   No facility-administered medications prior to visit.    Allergies  Allergen Reactions  . Amlodipine-Atorvastatin Other (See Comments)    REACTION: LEG CRAMPS  . Benazepril Hcl Other (See Comments)    REACTION: Cough  . Ezetimibe Other (See Comments)    REACTION: edema    ROS Review of Systems  Constitutional: Negative for chills, diaphoresis, fatigue, fever and unexpected weight change.  HENT: Negative.  Negative for congestion and postnasal drip.   Eyes: Negative for photophobia and visual disturbance.  Respiratory: Negative.  Negative for chest tightness and shortness of breath.   Cardiovascular: Negative.   Gastrointestinal: Negative.   Endocrine: Negative for polyphagia and polyuria.  Skin: Positive for wound.  Psychiatric/Behavioral: Positive for confusion and decreased concentration. Negative for agitation.      Objective:    Physical Exam  Constitutional: He appears well-developed and well-nourished. He appears lethargic. No distress.  HENT:  Head:  Normocephalic and atraumatic.  Neck: No JVD present. No tracheal deviation present.  Cardiovascular: Normal rate, regular rhythm and normal heart sounds.  Pulmonary/Chest: No stridor.  Neurological: He appears lethargic.  Skin: He is not diaphoretic.       BP 110/80 (BP Location: Left Arm, Patient Position: Sitting, Cuff Size: Normal)   Pulse 72   Temp (!) 96.8 F (36 C) (Temporal)   SpO2 99%  Wt Readings from Last 3 Encounters:  03/11/19 176 lb (79.8 kg)  01/17/19 174 lb 6 oz (79.1 kg)  09/02/18 158 lb 8 oz (71.9 kg)  Health Maintenance Due  Topic Date Due  . OPHTHALMOLOGY EXAM  01/21/2013  . FOOT EXAM  10/17/2014  . HEMOGLOBIN A1C  03/14/2017    There are no preventive care reminders to display for this patient.  Lab Results  Component Value Date   TSH 0.913 09/27/2016   Lab Results  Component Value Date   WBC 6.8 01/17/2019   HGB 10.8 (L) 01/17/2019   HCT 35.3 (L) 01/17/2019   MCV 84 01/17/2019   PLT 125 (L) 01/17/2019   Lab Results  Component Value Date   NA 143 01/17/2019   K 3.8 01/17/2019   CO2 21 01/17/2019   GLUCOSE 89 01/17/2019   BUN 23 01/17/2019   CREATININE 2.11 (H) 01/17/2019   BILITOT 1.3 (H) 09/02/2018   ALKPHOS 89 09/02/2018   AST 23 09/02/2018   ALT 10 09/02/2018   PROT 6.8 09/02/2018   ALBUMIN 2.7 (L) 09/02/2018   CALCIUM 8.2 (L) 01/17/2019   ANIONGAP 9 09/02/2018   GFR 42.49 (L) 05/30/2018   Lab Results  Component Value Date   CHOL 160 09/11/2016   Lab Results  Component Value Date   HDL 35.10 (L) 09/11/2016   Lab Results  Component Value Date   LDLCALC 102 (H) 09/11/2016   Lab Results  Component Value Date   TRIG 112.0 09/11/2016   Lab Results  Component Value Date   CHOLHDL 5 09/11/2016   Lab Results  Component Value Date   HGBA1C 5.1 09/11/2016      Assessment & Plan:   Problem List Items Addressed This Visit      Nervous and Auditory   Severe dementia (HCC)   Relevant Orders   Ambulatory referral  to Home Health     Musculoskeletal and Integument   Pressure injury of skin of left hip - Primary   Relevant Medications   Silverseal Hydrogel Dressing 4"X5" PADS   traMADol (ULTRAM) 50 MG tablet   Other Relevant Orders   Ambulatory referral to Home Health      Meds ordered this encounter  Medications  . Silverseal Hydrogel Dressing 4"X5" PADS    Sig: Apply to wound and change every 7 days.    Dispense:  10 each    Refill:  5  . traMADol (ULTRAM) 50 MG tablet    Sig: Take 1 tablet (50 mg total) by mouth every 6 (six) hours as needed for up to 5 days.    Dispense:  20 tablet    Refill:  0    Follow-up: Return in about 1 month (around 08/04/2019), or if symptoms worsen or fail to improve.   Have requested home health to assist with debridement and care of the patient's pressure sore.  Patient will follow up for return visit in 1 month or sooner as required.  Family was given information on pressure sores. Mliss Sax, MD

## 2019-07-04 NOTE — Patient Instructions (Signed)
Preventing Pressure Injuries  A pressure injury, sometimes called a bedsore or a pressure ulcer, is an injury to the skin and underlying tissue caused by pressure. A pressure injury can happen when your skin presses against a surface, such as a mattress or wheelchair seat, for too long. The pressure on the blood vessels causes reduced blood flow to your skin. This can eventually cause the skin tissue to die and break down into a wound. Pressure injuries usually develop:  Over bony parts of the body, such as the tailbone, shoulders, elbows, hips, and heels.  Under medical devices, such as respiratory equipment, stockings, tubes, and splints. How can this condition affect me? Pressure injuries are caused by a lack of blood supply to an area of skin. These injuries begin as a reddened area on the skin and can become an open sore. They can result from intense pressure over a short period of time or from less pressure over a long period of time. Pressure injuries can vary in severity. They can cause pain, muscle damage, and infection. What can increase my risk? This condition is more likely to develop in people who:  Are in the hospital or an extended care facility.  Are bedridden or in a wheelchair.  Have an injury or disease that keeps them from: ? Moving normally. ? Feeling pain or pressure. ? Communicating if they feel pain or pressure.  Have a condition that: ? Makes them sleepy or less alert. ? Causes poor blood flow.  Need to wear a medical device.  Have poor control of their bladder or bowel functions (incontinence).  Have poor nutrition (malnutrition).  Have had this condition before.  Are of certain ethnicities. People of African American, Latino, or Hispanic descent are at higher risk compared to other ethnic groups. What actions can I take to prevent pressure injuries? Reducing and redistributing pressure  Do not lie or sit in one position for a long time. Move or change  position: ? Every hour when out of bed in a chair. ? Every two hours when in bed. ? As often as told by your health care provider.  Use pillows, wedges, or cushions to redistribute pressure. Ask your health care provider to recommend a mattress, cushions, or pads for you.  Use medical devices that do not rub your skin. Tell your health care provider if one of your medical devices is causing pain or irritation. Skin care If you are in the hospital, your health care providers:  Will inspect your skin, including areas under or around medical devices, at least twice a day.  May recommend that you use certain types of bedding to help prevent pressure injuries. These may include a pad, mattress, or chair cushion that is filled with gel, air, water, or foam.  Will evaluate your nutrition and consult a dietitian if needed.  Will inspect and change any wound dressings regularly.  May help you move into different positions every few hours.  Will adjust any medical devices and braces as needed to limit pressure on your skin.  Will keep your skin clean and dry.  May use gentle cleansers and skin protectants if you are incontinent.  Will moisturize any dry skin. In general, at home:  Keep your skin clean and dry. Gently pat your skin dry.  Do not rub or massage bony areas of your skin.  Moisturize dry skin.  Use gentle cleansers and skin protectants routinely if you are incontinent.  Check your skin at least once   a day for any changes in color and for any new blisters or sores. Make sure to check under and around any medical devices and between skin folds. Have a caregiver do this for you if you are not able.  Lifestyle  Be as active as you can every day. Ask your health care provider to suggest safe exercises or activities.  Do not abuse drugs or alcohol.  Do not use any products that contain nicotine or tobacco, such as cigarettes, e-cigarettes, and chewing tobacco. If you need  help quitting, ask your health care provider. General instructions   Take over-the-counter and prescription medicines only as told by your health care provider.  Work with your health care provider to manage any chronic health conditions.  Eat a healthy diet that includes protein, vitamins, and minerals. Ask your health care provider what types of food you should eat.  Drink enough fluid to keep your urine pale yellow.  Keep all follow-up visits as told by your health care provider. This is important. Contact a health care provider if you:  Feel or see any changes in your skin. Summary  A pressure injury, sometimes called a bedsore or a pressure ulcer, is an injury to the skin and underlying tissue caused by pressure.  Do not lie or sit in one position for a long time.  Check your skin at least once a day for any changes in color and for any new blisters or sores.  Make sure to check under and around any medical devices and between skin folds. Have a caregiver do this for you if you are not able.  Eat a healthy diet that includes protein, vitamins, and minerals. Ask your health care provider what types of food you should eat. This information is not intended to replace advice given to you by your health care provider. Make sure you discuss any questions you have with your health care provider. Document Revised: 09/27/2018 Document Reviewed: 02/26/2018 Elsevier Patient Education  2020 [employer]  Pressure Injury  A pressure injury is damage to the skin and underlying tissue that results from pressure being applied to an area of the body. It often affects people who must spend a long time in a bed or chair because of a medical condition. Pressure injuries usually occur:  Over bony parts of the body, such as the tailbone, shoulders, elbows, hips, heels, spine, ankles, and back of the head.  Under medical devices that make contact with the body, such as respiratory equipment,  stockings, tubes, and splints. Pressure injuries start as reddened areas on the skin and can lead to pain and an open wound. What are the causes? This condition is caused by frequent or constant pressure to an area of the body. Decreased blood flow to the skin can eventually cause the skin tissue to die and break down, causing a wound. What increases the risk? You are more likely to develop this condition if you:  Are in the hospital or an extended care facility.  Are bedridden or in a wheelchair.  Have an injury or disease that keeps you from: ? Moving normally. ? Feeling pain or pressure.  Have a condition that: ? Makes you sleepy or less alert. ? Causes poor blood flow.  Need to wear a medical device.  Have poor control of your bladder or bowel functions (incontinence).  Have poor nutrition (malnutrition). If you are at risk for pressure injuries, your health care provider may recommend certain types of mattresses, mattress  covers, pillows, cushions, or boots to help prevent them. These may include products filled with air, foam, gel, or sand. What are the signs or symptoms? Symptoms of this condition depend on the severity of the injury. Symptoms may include:  Red or dark areas of the skin.  Pain, warmth, or a change of skin texture.  Blisters.  An open wound. How is this diagnosed? This condition is diagnosed with a medical history and physical exam. You may also have tests, such as:  Blood tests.  Imaging tests.  Blood flow tests. Your pressure injury will be staged based on its severity. Staging is based on:  The depth of the tissue injury, including whether there is exposure of muscle, bone, or tendon.  The cause of the pressure injury. How is this treated? This condition may be treated by:  Relieving or redistributing pressure on your skin. This includes: ? Frequently changing your position. ? Avoiding positions that caused the wound or that can make the  wound worse. ? Using specific bed mattresses, chair cushions, or protective boots. ? Moving medical devices from an area of pressure, or placing padding between the skin and the device. ? Using foams, creams, or powders to prevent rubbing (friction) on the skin.  Keeping your skin clean and dry. This may include using a skin cleanser or skin barrier as told by your health care provider.  Cleaning your injury and removing any dead tissue from the wound (debridement).  Placing a bandage (dressing) over your injury.  Using medicines for pain or to prevent or treat infection. Surgery may be needed if other treatments are not working or if your injury is very deep. Follow these instructions at home: Wound care  Follow instructions from your health care provider about how to take care of your wound. Make sure you: ? Wash your hands with soap and water before and after you change your bandage (dressing). If soap and water are not available, use hand sanitizer. ? Change your dressing as told by your health care provider.  Check your wound every day for signs of infection. Have a caregiver do this for you if you are not able. Check for: ? Redness, swelling, or increased pain. ? More fluid or blood. ? Warmth. ? Pus or a bad smell. Skin care  Keep your skin clean and dry. Gently pat your skin dry.  Do not rub or massage your skin.  You or a caregiver should check your skin every day for any changes in color or any new blisters or sores (ulcers). Medicines  Take over-the-counter and prescription medicines only as told by your health care provider.  If you were prescribed an antibiotic medicine, take or apply it as told by your health care provider. Do not stop using the antibiotic even if your condition improves. Reducing and redistributing pressure  Do not lie or sit in one position for a long time. Move or change position every 1-2 hours, or as told by your health care provider.  Use  pillows or cushions to reduce pressure. Ask your health care provider to recommend cushions or pads for you. General instructions   Eat a healthy diet that includes lots of protein.  Drink enough fluid to keep your urine pale yellow.  Be as active as you can every day. Ask your health care provider to suggest safe exercises or activities.  Do not abuse drugs or alcohol.  Do not use any products that contain nicotine or tobacco, such as cigarettes,  e-cigarettes, and chewing tobacco. If you need help quitting, ask your health care provider.  Keep all follow-up visits as told by your health care provider. This is important. Contact a health care provider if:  You have: ? A fever or chills. ? Pain that is not helped by medicine. ? Any changes in skin color. ? New blisters or sores. ? Pus or a bad smell coming from your wound. ? Redness, swelling, or pain around your wound. ? More fluid or blood coming from your wound.  Your wound does not improve after 1-2 weeks of treatment. Summary  A pressure injury is damage to the skin and underlying tissue that results from pressure being applied to an area of the body.  Do not lie or sit in one position for a long time. Your health care provider may advise you to move or change position every 1-2 hours.  Follow instructions from your health care provider about how to take care of your wound.  Keep all follow-up visits as told by your health care provider. This is important. This information is not intended to replace advice given to you by your health care provider. Make sure you discuss any questions you have with your health care provider. Document Revised: 01/02/2018 Document Reviewed: 01/02/2018 Elsevier Patient Education  2020 [employer].

## 2019-07-07 ENCOUNTER — Telehealth: Payer: Self-pay | Admitting: Family Medicine

## 2019-07-07 NOTE — Telephone Encounter (Signed)
Dr. Doreene Burke, would you send in Rx for Tramadol. I will call the family to let them know that they will have to go to home medical supply store for the dressing pads.

## 2019-07-07 NOTE — Telephone Encounter (Signed)
Patient daughter calling about tramadol, pt daughter said that pharmacy would not accept Rx that was given to her. RX must be sent electronically. Patient had Rx sent over for wound care and pharmacy does not carry this. Please contact patient.

## 2019-07-08 NOTE — Telephone Encounter (Signed)
Spoke with daughter who verbally understood she should take the Rx to a different pharmacy. And I gave her the information to [facility] medical supply on [address] she is aware that they will not take a Rx for the dressing pads that what they have is OTC. Daughter will pickup both Rx and the pads. No other questions at this time.

## 2019-07-08 NOTE — Telephone Encounter (Signed)
Not sure that I understand that. There should be no problem with given prescription. Take it to a different pharmacy or bring it back and we can send it electronically.

## 2019-07-14 ENCOUNTER — Telehealth: Payer: Self-pay | Admitting: Family Medicine

## 2019-07-14 NOTE — Telephone Encounter (Signed)
Tim from [facility] was calling to discuss referral that was put in for pt and would like a call back at [phone]. Asked to speak to Dr Ma Hillock nurse

## 2019-07-15 DIAGNOSIS — L89223 Pressure ulcer of left hip, stage 3: Secondary | ICD-10-CM | POA: Diagnosis not present

## 2019-07-15 DIAGNOSIS — N183 Chronic kidney disease, stage 3 unspecified: Secondary | ICD-10-CM | POA: Diagnosis not present

## 2019-07-15 DIAGNOSIS — G309 Alzheimer's disease, unspecified: Secondary | ICD-10-CM | POA: Diagnosis not present

## 2019-07-15 DIAGNOSIS — D631 Anemia in chronic kidney disease: Secondary | ICD-10-CM | POA: Diagnosis not present

## 2019-07-15 DIAGNOSIS — L89152 Pressure ulcer of sacral region, stage 2: Secondary | ICD-10-CM | POA: Diagnosis not present

## 2019-07-15 DIAGNOSIS — F0280 Dementia in other diseases classified elsewhere without behavioral disturbance: Secondary | ICD-10-CM | POA: Diagnosis not present

## 2019-07-15 DIAGNOSIS — L89322 Pressure ulcer of left buttock, stage 2: Secondary | ICD-10-CM | POA: Diagnosis not present

## 2019-07-15 DIAGNOSIS — G3183 Dementia with Lewy bodies: Secondary | ICD-10-CM | POA: Diagnosis not present

## 2019-07-15 DIAGNOSIS — I129 Hypertensive chronic kidney disease with stage 1 through stage 4 chronic kidney disease, or unspecified chronic kidney disease: Secondary | ICD-10-CM | POA: Diagnosis not present

## 2019-07-15 NOTE — Telephone Encounter (Signed)
Spoke with Tim from [facility], per tim he wanted to inform patient PCP that they would be going out to the patients home once a week to check on his wounds. No concerns at this time. Just wanted to make you aware.

## 2019-07-15 NOTE — Telephone Encounter (Signed)
Returned Tim's call, no answer LMTCB

## 2019-07-16 ENCOUNTER — Telehealth: Payer: Self-pay | Admitting: Family Medicine

## 2019-07-16 NOTE — Telephone Encounter (Signed)
Spoke with Charlynne Pander at [facility]. Cara calling for an order for a nurse to come out to th home twice a week for 2 weeks and once a week for 3 weeks to treat and evaluate patients wounds, also needing order for aid to come out for personal care twice a week for 3 weeks. Per Charlynne Pander they are also needing a order for PT and OT to come out and evaluate patient. Okay for order to be verbalized via telephone to [place]? Please advise

## 2019-07-16 NOTE — Telephone Encounter (Signed)
Left vm on Cara's identified phone(nurse at [facility]) with order approval per Cara's request.

## 2019-07-16 NOTE — Telephone Encounter (Signed)
Yes, thanks

## 2019-07-16 NOTE — Telephone Encounter (Signed)
Anthony [name] is calling requesting a order for nursing for 2 week 2 and 1 week 3. Also for  PT and OT to evaluate. They will be doing a hospice referral after therapy elevations. CB is [phone].

## 2019-07-17 DIAGNOSIS — I129 Hypertensive chronic kidney disease with stage 1 through stage 4 chronic kidney disease, or unspecified chronic kidney disease: Secondary | ICD-10-CM | POA: Diagnosis not present

## 2019-07-17 DIAGNOSIS — F0280 Dementia in other diseases classified elsewhere without behavioral disturbance: Secondary | ICD-10-CM | POA: Diagnosis not present

## 2019-07-17 DIAGNOSIS — L89322 Pressure ulcer of left buttock, stage 2: Secondary | ICD-10-CM | POA: Diagnosis not present

## 2019-07-17 DIAGNOSIS — N183 Chronic kidney disease, stage 3 unspecified: Secondary | ICD-10-CM | POA: Diagnosis not present

## 2019-07-17 DIAGNOSIS — L89152 Pressure ulcer of sacral region, stage 2: Secondary | ICD-10-CM | POA: Diagnosis not present

## 2019-07-17 DIAGNOSIS — G3183 Dementia with Lewy bodies: Secondary | ICD-10-CM | POA: Diagnosis not present

## 2019-07-17 DIAGNOSIS — D631 Anemia in chronic kidney disease: Secondary | ICD-10-CM | POA: Diagnosis not present

## 2019-07-17 DIAGNOSIS — G309 Alzheimer's disease, unspecified: Secondary | ICD-10-CM | POA: Diagnosis not present

## 2019-07-17 DIAGNOSIS — L89223 Pressure ulcer of left hip, stage 3: Secondary | ICD-10-CM | POA: Diagnosis not present

## 2019-07-18 DIAGNOSIS — F0280 Dementia in other diseases classified elsewhere without behavioral disturbance: Secondary | ICD-10-CM | POA: Diagnosis not present

## 2019-07-18 DIAGNOSIS — L89322 Pressure ulcer of left buttock, stage 2: Secondary | ICD-10-CM | POA: Diagnosis not present

## 2019-07-18 DIAGNOSIS — D631 Anemia in chronic kidney disease: Secondary | ICD-10-CM | POA: Diagnosis not present

## 2019-07-18 DIAGNOSIS — I129 Hypertensive chronic kidney disease with stage 1 through stage 4 chronic kidney disease, or unspecified chronic kidney disease: Secondary | ICD-10-CM | POA: Diagnosis not present

## 2019-07-18 DIAGNOSIS — N183 Chronic kidney disease, stage 3 unspecified: Secondary | ICD-10-CM | POA: Diagnosis not present

## 2019-07-18 DIAGNOSIS — G3183 Dementia with Lewy bodies: Secondary | ICD-10-CM | POA: Diagnosis not present

## 2019-07-18 DIAGNOSIS — L89223 Pressure ulcer of left hip, stage 3: Secondary | ICD-10-CM | POA: Diagnosis not present

## 2019-07-18 DIAGNOSIS — L89152 Pressure ulcer of sacral region, stage 2: Secondary | ICD-10-CM | POA: Diagnosis not present

## 2019-07-18 DIAGNOSIS — G309 Alzheimer's disease, unspecified: Secondary | ICD-10-CM | POA: Diagnosis not present

## 2019-07-21 DIAGNOSIS — G309 Alzheimer's disease, unspecified: Secondary | ICD-10-CM | POA: Diagnosis not present

## 2019-07-21 DIAGNOSIS — F0280 Dementia in other diseases classified elsewhere without behavioral disturbance: Secondary | ICD-10-CM | POA: Diagnosis not present

## 2019-07-21 DIAGNOSIS — I129 Hypertensive chronic kidney disease with stage 1 through stage 4 chronic kidney disease, or unspecified chronic kidney disease: Secondary | ICD-10-CM | POA: Diagnosis not present

## 2019-07-21 DIAGNOSIS — N183 Chronic kidney disease, stage 3 unspecified: Secondary | ICD-10-CM | POA: Diagnosis not present

## 2019-07-21 DIAGNOSIS — D631 Anemia in chronic kidney disease: Secondary | ICD-10-CM | POA: Diagnosis not present

## 2019-07-21 DIAGNOSIS — L89223 Pressure ulcer of left hip, stage 3: Secondary | ICD-10-CM | POA: Diagnosis not present

## 2019-07-21 DIAGNOSIS — L89322 Pressure ulcer of left buttock, stage 2: Secondary | ICD-10-CM | POA: Diagnosis not present

## 2019-07-21 DIAGNOSIS — G3183 Dementia with Lewy bodies: Secondary | ICD-10-CM | POA: Diagnosis not present

## 2019-07-21 DIAGNOSIS — L89152 Pressure ulcer of sacral region, stage 2: Secondary | ICD-10-CM | POA: Diagnosis not present

## 2019-07-22 ENCOUNTER — Other Ambulatory Visit: Payer: Self-pay | Admitting: Family Medicine

## 2019-07-22 DIAGNOSIS — D631 Anemia in chronic kidney disease: Secondary | ICD-10-CM | POA: Diagnosis not present

## 2019-07-22 DIAGNOSIS — N183 Chronic kidney disease, stage 3 unspecified: Secondary | ICD-10-CM | POA: Diagnosis not present

## 2019-07-22 DIAGNOSIS — G3183 Dementia with Lewy bodies: Secondary | ICD-10-CM | POA: Diagnosis not present

## 2019-07-22 DIAGNOSIS — I129 Hypertensive chronic kidney disease with stage 1 through stage 4 chronic kidney disease, or unspecified chronic kidney disease: Secondary | ICD-10-CM | POA: Diagnosis not present

## 2019-07-22 DIAGNOSIS — R609 Edema, unspecified: Secondary | ICD-10-CM

## 2019-07-22 DIAGNOSIS — F0280 Dementia in other diseases classified elsewhere without behavioral disturbance: Secondary | ICD-10-CM | POA: Diagnosis not present

## 2019-07-22 DIAGNOSIS — L89223 Pressure ulcer of left hip, stage 3: Secondary | ICD-10-CM | POA: Diagnosis not present

## 2019-07-22 DIAGNOSIS — L89322 Pressure ulcer of left buttock, stage 2: Secondary | ICD-10-CM | POA: Diagnosis not present

## 2019-07-22 DIAGNOSIS — G309 Alzheimer's disease, unspecified: Secondary | ICD-10-CM | POA: Diagnosis not present

## 2019-07-22 DIAGNOSIS — L89152 Pressure ulcer of sacral region, stage 2: Secondary | ICD-10-CM | POA: Diagnosis not present

## 2019-07-23 ENCOUNTER — Telehealth: Payer: Self-pay | Admitting: Family Medicine

## 2019-07-23 DIAGNOSIS — L89152 Pressure ulcer of sacral region, stage 2: Secondary | ICD-10-CM | POA: Diagnosis not present

## 2019-07-23 DIAGNOSIS — N183 Chronic kidney disease, stage 3 unspecified: Secondary | ICD-10-CM | POA: Diagnosis not present

## 2019-07-23 DIAGNOSIS — I129 Hypertensive chronic kidney disease with stage 1 through stage 4 chronic kidney disease, or unspecified chronic kidney disease: Secondary | ICD-10-CM | POA: Diagnosis not present

## 2019-07-23 DIAGNOSIS — L89223 Pressure ulcer of left hip, stage 3: Secondary | ICD-10-CM | POA: Diagnosis not present

## 2019-07-23 DIAGNOSIS — D631 Anemia in chronic kidney disease: Secondary | ICD-10-CM | POA: Diagnosis not present

## 2019-07-23 DIAGNOSIS — F0280 Dementia in other diseases classified elsewhere without behavioral disturbance: Secondary | ICD-10-CM | POA: Diagnosis not present

## 2019-07-23 DIAGNOSIS — G309 Alzheimer's disease, unspecified: Secondary | ICD-10-CM | POA: Diagnosis not present

## 2019-07-23 DIAGNOSIS — G3183 Dementia with Lewy bodies: Secondary | ICD-10-CM | POA: Diagnosis not present

## 2019-07-23 DIAGNOSIS — L89322 Pressure ulcer of left buttock, stage 2: Secondary | ICD-10-CM | POA: Diagnosis not present

## 2019-07-23 NOTE — Telephone Encounter (Signed)
Verbal order given to [facility] for PT 1 time a week for 5 weeks. No questions at this time.

## 2019-07-23 NOTE — Telephone Encounter (Signed)
Anthony [name] is calling and requesting verbal orders for PT 1 time a week for 5 weeks. CB is [phone]

## 2019-07-25 DIAGNOSIS — L89223 Pressure ulcer of left hip, stage 3: Secondary | ICD-10-CM | POA: Diagnosis not present

## 2019-07-25 DIAGNOSIS — L89322 Pressure ulcer of left buttock, stage 2: Secondary | ICD-10-CM | POA: Diagnosis not present

## 2019-07-25 DIAGNOSIS — F0280 Dementia in other diseases classified elsewhere without behavioral disturbance: Secondary | ICD-10-CM | POA: Diagnosis not present

## 2019-07-25 DIAGNOSIS — G3183 Dementia with Lewy bodies: Secondary | ICD-10-CM | POA: Diagnosis not present

## 2019-07-25 DIAGNOSIS — I129 Hypertensive chronic kidney disease with stage 1 through stage 4 chronic kidney disease, or unspecified chronic kidney disease: Secondary | ICD-10-CM | POA: Diagnosis not present

## 2019-07-25 DIAGNOSIS — L89152 Pressure ulcer of sacral region, stage 2: Secondary | ICD-10-CM | POA: Diagnosis not present

## 2019-07-25 DIAGNOSIS — D631 Anemia in chronic kidney disease: Secondary | ICD-10-CM | POA: Diagnosis not present

## 2019-07-25 DIAGNOSIS — G309 Alzheimer's disease, unspecified: Secondary | ICD-10-CM | POA: Diagnosis not present

## 2019-07-25 DIAGNOSIS — N183 Chronic kidney disease, stage 3 unspecified: Secondary | ICD-10-CM | POA: Diagnosis not present

## 2019-07-29 DIAGNOSIS — L89152 Pressure ulcer of sacral region, stage 2: Secondary | ICD-10-CM | POA: Diagnosis not present

## 2019-07-29 DIAGNOSIS — F0280 Dementia in other diseases classified elsewhere without behavioral disturbance: Secondary | ICD-10-CM | POA: Diagnosis not present

## 2019-07-29 DIAGNOSIS — I129 Hypertensive chronic kidney disease with stage 1 through stage 4 chronic kidney disease, or unspecified chronic kidney disease: Secondary | ICD-10-CM | POA: Diagnosis not present

## 2019-07-29 DIAGNOSIS — G309 Alzheimer's disease, unspecified: Secondary | ICD-10-CM | POA: Diagnosis not present

## 2019-07-29 DIAGNOSIS — N183 Chronic kidney disease, stage 3 unspecified: Secondary | ICD-10-CM | POA: Diagnosis not present

## 2019-07-29 DIAGNOSIS — G3183 Dementia with Lewy bodies: Secondary | ICD-10-CM | POA: Diagnosis not present

## 2019-07-29 DIAGNOSIS — L89322 Pressure ulcer of left buttock, stage 2: Secondary | ICD-10-CM | POA: Diagnosis not present

## 2019-07-29 DIAGNOSIS — D631 Anemia in chronic kidney disease: Secondary | ICD-10-CM | POA: Diagnosis not present

## 2019-07-29 DIAGNOSIS — L89223 Pressure ulcer of left hip, stage 3: Secondary | ICD-10-CM | POA: Diagnosis not present

## 2019-07-30 ENCOUNTER — Telehealth: Payer: Self-pay | Admitting: Family Medicine

## 2019-07-30 DIAGNOSIS — L89223 Pressure ulcer of left hip, stage 3: Secondary | ICD-10-CM | POA: Diagnosis not present

## 2019-07-30 DIAGNOSIS — D631 Anemia in chronic kidney disease: Secondary | ICD-10-CM | POA: Diagnosis not present

## 2019-07-30 DIAGNOSIS — G3183 Dementia with Lewy bodies: Secondary | ICD-10-CM | POA: Diagnosis not present

## 2019-07-30 DIAGNOSIS — F0280 Dementia in other diseases classified elsewhere without behavioral disturbance: Secondary | ICD-10-CM | POA: Diagnosis not present

## 2019-07-30 DIAGNOSIS — L89322 Pressure ulcer of left buttock, stage 2: Secondary | ICD-10-CM | POA: Diagnosis not present

## 2019-07-30 DIAGNOSIS — L89152 Pressure ulcer of sacral region, stage 2: Secondary | ICD-10-CM | POA: Diagnosis not present

## 2019-07-30 DIAGNOSIS — I129 Hypertensive chronic kidney disease with stage 1 through stage 4 chronic kidney disease, or unspecified chronic kidney disease: Secondary | ICD-10-CM | POA: Diagnosis not present

## 2019-07-30 DIAGNOSIS — N183 Chronic kidney disease, stage 3 unspecified: Secondary | ICD-10-CM | POA: Diagnosis not present

## 2019-07-30 DIAGNOSIS — G309 Alzheimer's disease, unspecified: Secondary | ICD-10-CM | POA: Diagnosis not present

## 2019-07-30 NOTE — Telephone Encounter (Signed)
Verbal order given to Larena Sox for pt for evaluation and admit for hospice. Morrie Sheldon will also fax over order to be signed.

## 2019-07-30 NOTE — Telephone Encounter (Signed)
Anthony [name] is calling to get verbal orders to evaluate and admit for hospice. CB is [phone].

## 2019-08-01 NOTE — Telephone Encounter (Signed)
Anthony [name] is calling back regarding orders. CB is [phone]

## 2019-08-01 NOTE — Telephone Encounter (Signed)
Thanks

## 2019-08-01 NOTE — Telephone Encounter (Signed)
Spoke with Kendal Hymen with hospice she was calling to inform Dr. Doreene Burke that she had gone out to the home to visit patient. No concerns at this time. Kendal Hymen aware that verbal order was given to Larena Sox and written order will be faxed to [facility] as well.

## 2019-08-11 ENCOUNTER — Telehealth: Payer: Self-pay | Admitting: Family Medicine

## 2019-08-11 DIAGNOSIS — R609 Edema, unspecified: Secondary | ICD-10-CM

## 2019-08-11 NOTE — Telephone Encounter (Addendum)
Maralyn Sago is calling and requesting a refill for allopurinol and is requesting morphine oral solution 20 mg for patient.1 mg and dosing .25 ML every 4 hours for pain and as needed. [facility] uses [facility] on [address]. CB is [phone]

## 2019-08-12 MED ORDER — ALLOPURINOL 100 MG PO TABS: 100.0000 mg | ORAL_TABLET | Freq: Every day | ORAL | 1 refills | Status: DC

## 2019-08-12 NOTE — Telephone Encounter (Signed)
Allopurinol filled.   Hospice to fill rx for morphine.

## 2019-08-12 NOTE — Telephone Encounter (Signed)
Spoke with Huntley Dec who's aware that Allopurinol sent in but hospice will need to refill morphine.

## 2019-08-12 NOTE — Telephone Encounter (Signed)
Dr. Kremer please advise message below.  

## 2019-08-20 ENCOUNTER — Other Ambulatory Visit: Payer: Self-pay | Admitting: Family Medicine

## 2019-08-20 ENCOUNTER — Telehealth: Payer: Self-pay | Admitting: Family Medicine

## 2019-08-20 ENCOUNTER — Telehealth: Payer: Self-pay

## 2019-08-20 DIAGNOSIS — F0390 Unspecified dementia without behavioral disturbance: Secondary | ICD-10-CM

## 2019-08-20 DIAGNOSIS — F03C0 Unspecified dementia, severe, without behavioral disturbance, psychotic disturbance, mood disturbance, and anxiety: Secondary | ICD-10-CM

## 2019-08-20 NOTE — Telephone Encounter (Signed)
Spoke with patients daughter who states that patient has more pressure sore on his side and buttocks and there is a bad odor coming from them possible infection. Calling to see about antibiotics being called in. She states that he is unable to walk for them to come in, she's willing to do virtual visit you are booked until mid next week. Patient wanting to either speak with you for any suggestions as to what she can do other than trying to keep patient off the areas. Please advise.

## 2019-08-20 NOTE — Telephone Encounter (Signed)
Nurse  Huntley Dec from [facility] calling for refill on pending medications, okay to refill? Please advise.

## 2019-08-20 NOTE — Telephone Encounter (Signed)
Error message

## 2019-08-20 NOTE — Telephone Encounter (Signed)
Patient daughter is calling and stated that pt was seen on 07/04/2019 for his pressure sores and wanted to see if was there an antibiotic that could be sent to [facility] on [address] CB [phone]

## 2019-08-20 NOTE — Telephone Encounter (Signed)
Spoke with patients daughter Anthony [name]) who states that the nurses that come out to the home they only come 1 once a week to change patients wounds and the aid only gives him a bath. Anthony [name] states that she did order some more pads for patient and she will see how these work. I informed her that it is normal for these types of wounds to have a smell advised her to continue changing as directed and keeping patient turned every 2 hours to help with pressure on these areas. Anthony [name] agrees to this and would like to know if you could send in a prescription for pain, she have not been wanting Anthony [name] on the Morphine hospice offer she's asking for something not as stronge to have him out of it all day. Please advise.

## 2019-08-20 NOTE — Telephone Encounter (Signed)
Anthony [name] is calling and requesting a refill for donepezil and doxazosin [facility] to [facility] on [address] CB is [phone]

## 2019-08-20 NOTE — Telephone Encounter (Signed)
Called patients daughter to see how things were going with patient and to see if pressure sore were improving at all. No answer LMTCB

## 2019-08-20 NOTE — Telephone Encounter (Signed)
Please check with visiting home health nurses. Unfortunately these sores do smell.

## 2019-08-21 ENCOUNTER — Encounter: Payer: Self-pay | Admitting: Family Medicine

## 2019-08-21 ENCOUNTER — Telehealth (INDEPENDENT_AMBULATORY_CARE_PROVIDER_SITE_OTHER): Payer: Medicare PPO | Admitting: Family Medicine

## 2019-08-21 VITALS — Temp 97.3°F | Ht 69.0 in

## 2019-08-21 DIAGNOSIS — L89223 Pressure ulcer of left hip, stage 3: Secondary | ICD-10-CM

## 2019-08-21 DIAGNOSIS — F0390 Unspecified dementia without behavioral disturbance: Secondary | ICD-10-CM | POA: Diagnosis not present

## 2019-08-21 DIAGNOSIS — F03C0 Unspecified dementia, severe, without behavioral disturbance, psychotic disturbance, mood disturbance, and anxiety: Secondary | ICD-10-CM

## 2019-08-21 MED ORDER — DOXAZOSIN MESYLATE 4 MG PO TABS: 4.0000 mg | ORAL_TABLET | Freq: Every day | ORAL | 0 refills | Status: DC

## 2019-08-21 MED ORDER — DONEPEZIL HCL 10 MG PO TABS: ORAL_TABLET | ORAL | 3 refills | Status: DC

## 2019-08-21 NOTE — Progress Notes (Signed)
Established Patient Office Visit  Subjective:  Patient ID: Anthony [name], male    DOB: [birth year]  Age: 84 y.o. MRN: [id number]  CC:  Chief Complaint  Patient presents with  . Pain    wounds very tender not healing very well patients daughter would like tramadol sent in for pain.     HPI Christpoher Sievers Demma presents for follow-up of his pressure sores and home care issues as being experienced by his ongoing caregiver and daughter Janace Hoard.  Visiting home health is coming once weekly.  Hospice is also coming once weekly at this point.  Angie is concerned about giving her dad morphine as opposed to the tramadol and making him even more somnolent.  Hospice had suggested that he could take morphine every 4 hours as needed at this point he is able to answer yes/no questions to some extent.  He is taking nutrition.  He remains bedbound but was able to stand once with assistance.  The majority of the pain he is experiencing at this point are when the dressings are changed for his chronic wounds and he has to be moved.  Past Medical History:  Diagnosis Date  . ABNORMAL ELECTROCARDIOGRAM 05/03/2010  . Alzheimer disease ([place])   . GALLSTONES 03/15/2007  . GOUT 03/15/2007  . HYPERLIPIDEMIA 03/15/2007  . HYPERTENSION 03/15/2007  . NEPHROPATHY, DIABETIC 03/15/2007  . RENAL INSUFFICIENCY 04/30/2009  . SHOULDER PAIN, BILATERAL 04/29/2008  . UROLITHIASIS, HX OF 03/15/2007    Past Surgical History:  Procedure Laterality Date  . NASAL SEPTUM SURGERY  1978    Family History  Problem Relation Age of Onset  . Cancer Father        Prostate Cancer  . Colon cancer Neg Hx   . Esophageal cancer Neg Hx   . Stomach cancer Neg Hx     Social History   Socioeconomic History  . Marital status: Divorced    Spouse name: Not on file  . Number of children: Not on file  . Years of education: Not on file  . Highest education level: Not on file  Occupational History  . Occupation: Retired  Tobacco Use  .  Smoking status: Never Smoker  . Smokeless tobacco: Never Used  Substance and Sexual Activity  . Alcohol use: No    Alcohol/week: 0.0 standard drinks  . Drug use: No  . Sexual activity: Never  Other Topics Concern  . 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 Difficulty of Paying Living Expenses: Not on file  Food Insecurity:   . Worried About Charity fundraiser in the Last Year: Not on file  . Ran Out of Food in the Last Year: Not on file  Transportation Needs:   . Lack of Transportation (Medical): Not on file  . Lack of Transportation (Non-Medical): Not on file  Physical Activity:   . Days of Exercise per Week: Not on file  . Minutes of Exercise per Session: Not on file  Stress:   . Feeling of Stress : Not on file  Social Connections:   . Frequency of Communication with Friends and Family: Not on file  . Frequency of Social Gatherings with Friends and Family: Not on file  . Attends Religious Services: Not on file  . Active Member of Clubs or Organizations: Not on file  . Attends Club or  Organization Meetings: Not on file  . Marital Status: Not on file  Intimate Partner Violence:   . Fear of Current or Ex-Partner: Not on file  . Emotionally Abused: Not on file  . Physically Abused: Not on file  . Sexually Abused: Not on file    Outpatient Medications Prior to Visit  Medication Sig Dispense Refill  . acetaminophen (TYLENOL) 325 MG tablet Take 2 tablets (650 mg total) by mouth every 6 (six) hours as needed for mild pain, moderate pain or headache (or Fever >/= 101). 40 tablet 0  . allopurinol (ZYLOPRIM) 100 MG tablet Take 1 tablet (100 mg total) by mouth daily. 90 tablet 1  . aspirin 81 MG tablet Take 1 tablet (81 mg total) by mouth daily. 90 tablet 2  . donepezil (ARICEPT) 10 MG tablet Take 1 tablet by mouth once daily with breakfast 90 tablet 3  . doxazosin (CARDURA) 4  MG tablet Take 1 tablet (4 mg total) by mouth daily. 30 tablet 0  . furosemide (LASIX) 40 MG tablet Take 1 tablet by mouth once daily 90 tablet 0  . memantine (NAMENDA) 10 MG tablet Take 1 tablet (10 mg total) by mouth 2 (two) times daily. 180 tablet 3  . mirtazapine (REMERON SOL-TAB) 15 MG disintegrating tablet DISSOLVE 1 TABLET BY MOUTH AT BEDTIME 30 tablet 8  . pantoprazole (PROTONIX) 40 MG tablet Take 1 tablet by mouth twice daily 180 tablet 2  . Silverseal Hydrogel Dressing 4"X5" PADS Apply to wound and change every 7 days. 10 each 5  . triamcinolone ointment (KENALOG) 0.1 % APPLY  OINTMENT TOPICALLY TWICE DAILY 30 g 0   No facility-administered medications prior to visit.    Allergies  Allergen Reactions  . Amlodipine-Atorvastatin Other (See Comments)    REACTION: LEG CRAMPS  . Benazepril Hcl Other (See Comments)    REACTION: Cough  . Ezetimibe Other (See Comments)    REACTION: edema    ROS Review of Systems  Constitutional: Negative.   Respiratory: Negative.   Cardiovascular: Negative.   Gastrointestinal: Negative.   Skin: Positive for color change and wound.  Neurological: Positive for speech difficulty and weakness.  Psychiatric/Behavioral: Positive for confusion and decreased concentration.      Objective:    Physical Exam  Constitutional: He appears well-developed and well-nourished. He appears listless. No distress.  HENT:  Head: Normocephalic and atraumatic.  Pulmonary/Chest: Effort normal.  Neurological: He appears listless.  Skin: He is not diaphoretic.  Psychiatric: He is slowed. He is noncommunicative.    Temp (!) 97.3 F (36.3 C) (Tympanic)   Ht 5\' 9"  (1.753 m)   BMI 25.99 kg/m  Wt Readings from Last 3 Encounters:  03/11/19 176 lb (79.8 kg)  01/17/19 174 lb 6 oz (79.1 kg)  09/02/18 158 lb 8 oz (71.9 kg)     Health Maintenance Due  Topic Date Due  . OPHTHALMOLOGY EXAM  01/21/2013  . FOOT EXAM  10/17/2014  . HEMOGLOBIN A1C  03/14/2017     There are no preventive care reminders to display for this patient.  Lab Results  Component Value Date   TSH 0.913 09/27/2016   Lab Results  Component Value Date   WBC 6.8 01/17/2019   HGB 10.8 (L) 01/17/2019   HCT 35.3 (L) 01/17/2019   MCV 84 01/17/2019   PLT 125 (L) 01/17/2019   Lab Results  Component Value Date   NA 143 01/17/2019   K 3.8 01/17/2019   CO2 21 01/17/2019   GLUCOSE  89 01/17/2019   BUN 23 01/17/2019   CREATININE 2.11 (H) 01/17/2019   BILITOT 1.3 (H) 09/02/2018   ALKPHOS 89 09/02/2018   AST 23 09/02/2018   ALT 10 09/02/2018   PROT 6.8 09/02/2018   ALBUMIN 2.7 (L) 09/02/2018   CALCIUM 8.2 (L) 01/17/2019   ANIONGAP 9 09/02/2018   GFR 42.49 (L) 05/30/2018   Lab Results  Component Value Date   CHOL 160 09/11/2016   Lab Results  Component Value Date   HDL 35.10 (L) 09/11/2016   Lab Results  Component Value Date   LDLCALC 102 (H) 09/11/2016   Lab Results  Component Value Date   TRIG 112.0 09/11/2016   Lab Results  Component Value Date   CHOLHDL 5 09/11/2016   Lab Results  Component Value Date   HGBA1C 5.1 09/11/2016      Assessment & Plan:   Problem List Items Addressed This Visit      Nervous and Auditory   Severe dementia (HCC)     Musculoskeletal and Integument   Pressure injury of skin of left hip - Primary      No orders of the defined types were placed in this encounter.   Follow-up: Return if symptoms worsen or fail to improve.   We discussed his care going forward.  Suggested that it would be appropriate to hold his morphine until just before dressing changes and then only use on a as needed basis.  We discussed that the goal per Dr. Silver Huguenin would be comfort care going forward.  Daughter is to follow-up with any questions or concerns. Mliss Sax, MD

## 2019-08-21 NOTE — Telephone Encounter (Signed)
Refill sent in

## 2019-08-21 NOTE — Telephone Encounter (Signed)
Appointment scheduled.

## 2019-08-21 NOTE — Telephone Encounter (Signed)
Lets do a virtual and continue the morphine for now.

## 2019-08-25 ENCOUNTER — Encounter: Payer: Self-pay | Admitting: Family Medicine

## 2019-08-25 NOTE — Telephone Encounter (Signed)
Patient daughter [name]) has uploaded a form for Dr. Doreene Burke to sign. Patient daughter is requesting a call back to pick up form when it is completed. CB is [phone]

## 2019-09-17 ENCOUNTER — Other Ambulatory Visit: Payer: Self-pay | Admitting: Family Medicine

## 2019-09-17 ENCOUNTER — Telehealth: Payer: Self-pay | Admitting: Family Medicine

## 2019-09-17 DIAGNOSIS — R609 Edema, unspecified: Secondary | ICD-10-CM

## 2019-09-17 NOTE — Telephone Encounter (Signed)
Anthony [name] is calling and  Requesting a refill for doxazosin and surosemide sent to [facility] on [address]. CB is [phone]

## 2019-09-18 NOTE — Telephone Encounter (Signed)
Med filled yesterday

## 2019-09-20 ENCOUNTER — Other Ambulatory Visit: Payer: Self-pay | Admitting: Family Medicine

## 2019-09-22 ENCOUNTER — Other Ambulatory Visit: Payer: Self-pay | Admitting: Family Medicine

## 2019-10-01 ENCOUNTER — Telehealth: Payer: Self-pay | Admitting: Family Medicine

## 2019-10-01 NOTE — Telephone Encounter (Signed)
Anthony [name] is calling and requesting a medication refill for Trazodone sent to [facility] on [address] CB is [phone]

## 2019-10-01 NOTE — Telephone Encounter (Signed)
Spoke with Huntley Dec who verbally understood Dr. Doreene Burke have not prescribed this medication in a year. Huntley Dec states that she will have one of the docs at hospice write the script.

## 2019-10-21 NOTE — Progress Notes (Signed)
This visit is being conducted via phone call  - after an attmept to do on video chat - due to the COVID-19 pandemic. This patient has given me verbal consent via phone to conduct this visit, patient states they are participating from their home address. Some vital signs may be absent or patient reported.   Patient identification: identified by name, DOB, and current address.   Done with Pt's daughter Anthony [name]. Okay per DPR.  Subjective:   Anthony [name] is a 84 y.o. male who presents for Medicare Annual/Subsequent preventive examination.  Review of Systems:   Home Safety/Smoke Alarms: Feels safe in home. Smoke alarms in place.  Hospice RN once per wk, CNA 3x/ wk. Daughter lives with pt.   Male:   PSA-  Lab Results  Component Value Date   PSA 1.93 09/11/2016   PSA 1.92 09/10/2015   PSA 2.19 09/02/2014       Objective:    Vitals: Unable to assess. This visit is enabled though telemedicine due to Covid 19.   Advanced Directives 10/22/2019 03/28/2017 09/27/2016 09/27/2016 09/11/2016 02/20/2016 09/10/2015  Does Patient Have a Medical Advance Directive? Yes Yes Yes Yes Yes Yes No;Yes  Type of Estate agent of [place];Living will Healthcare Power of [place];Living will Healthcare Power of [place];Living will Healthcare Power of [place];Living will Healthcare Power of [place];Living will Healthcare Power of [employer] of [place];Living will  Does patient want to make changes to medical advance directive? No - Patient declined - No - Patient declined - - No - Patient declined -  Copy of Healthcare Power of Attorney in Chart? No - copy requested No - copy requested No - copy requested No - copy requested No - copy requested No - copy requested No - copy requested  Would patient like information on creating a medical advance directive? - - - - - - -    Tobacco Social History   Tobacco Use  Smoking Status Never Smoker  Smokeless Tobacco Never Used       Counseling given: Not Answered   Clinical Intake:     Pain : No/denies pain                 Past Medical History:  Diagnosis Date  . ABNORMAL ELECTROCARDIOGRAM 05/03/2010  . Alzheimer disease (HCC)   . GALLSTONES 03/15/2007  . GOUT 03/15/2007  . HYPERLIPIDEMIA 03/15/2007  . HYPERTENSION 03/15/2007  . NEPHROPATHY, DIABETIC 03/15/2007  . RENAL INSUFFICIENCY 04/30/2009  . SHOULDER PAIN, BILATERAL 04/29/2008  . UROLITHIASIS, HX OF 03/15/2007   Past Surgical History:  Procedure Laterality Date  . NASAL SEPTUM SURGERY  1978   Family History  Problem Relation Age of Onset  . Cancer Father        Prostate Cancer  . Colon cancer Neg Hx   . Esophageal cancer Neg Hx   . Stomach cancer Neg Hx    Social History   Socioeconomic History  . Marital status: Divorced    Spouse name: Not on file  . Number of children: Not on file  . Years of education: Not on file  . Highest education level: Not on file  Occupational History  . Occupation: Retired  Tobacco Use  . Smoking status: Never Smoker  . Smokeless tobacco: Never Used  Substance and Sexual Activity  . Alcohol use: No    Alcohol/week: 0.0 standard drinks  . Drug use: No  . Sexual activity: Never  Other Topics Concern  . 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 Difficulty of Paying Living Expenses:   Food Insecurity:   . Worried About Charity fundraiser in the Last Year:   . Arboriculturist in the Last Year:   Transportation Needs:   . Film/video editor (Medical):   Marland Kitchen Lack of Transportation (Non-Medical):   Physical Activity:   . Days of Exercise per Week:   . Minutes of Exercise per Session:   Stress:   . Feeling of Stress :   Social Connections:   . Frequency of Communication with Friends and Family:   . Frequency of Social Gatherings with Friends and Family:   . Attends Religious  Services:   . Active Member of Clubs or Organizations:   . Attends Archivist Meetings:   Marland Kitchen Marital Status:     Outpatient Encounter Medications as of 10/22/2019  Medication Sig  . acetaminophen (TYLENOL) 325 MG tablet Take 2 tablets (650 mg total) by mouth every 6 (six) hours as needed for mild pain, moderate pain or headache (or Fever >/= 101).  Marland Kitchen allopurinol (ZYLOPRIM) 100 MG tablet Take 1 tablet (100 mg total) by mouth daily.  Marland Kitchen aspirin 81 MG tablet Take 1 tablet (81 mg total) by mouth daily.  Marland Kitchen donepezil (ARICEPT) 10 MG tablet Take 1 tablet by mouth once daily with breakfast  . doxazosin (CARDURA) 4 MG tablet Take 1 tablet by mouth once daily  . furosemide (LASIX) 40 MG tablet Take 1 tablet by mouth once daily  . memantine (NAMENDA) 10 MG tablet Take 1 tablet (10 mg total) by mouth 2 (two) times daily.  . mirtazapine (REMERON SOL-TAB) 15 MG disintegrating tablet DISSOLVE 1 TABLET BY MOUTH AT BEDTIME  . pantoprazole (PROTONIX) 40 MG tablet Take 1 tablet by mouth twice daily  . Silverseal Hydrogel Dressing 4"X5" PADS Apply to wound and change every 7 days.  Marland Kitchen triamcinolone ointment (KENALOG) 0.1 % APPLY  OINTMENT TOPICALLY TWICE DAILY (Patient not taking: Reported on 10/22/2019)   No facility-administered encounter medications on file as of 10/22/2019.    Activities of Daily Living In your present state of health, do you have any difficulty performing the following activities: 10/22/2019  Hearing? N  Vision? N  Difficulty concentrating or making decisions? Y  Walking or climbing stairs? Y  Dressing or bathing? Y  Doing errands, shopping? Y  Preparing Food and eating ? Y  Using the Toilet? Y  In the past six months, have you accidently leaked urine? Y  Do you have problems with loss of bowel control? Y  Managing your Medications? Y  Managing your Finances? Y  Housekeeping or managing your Housekeeping? Y  Some recent data might be hidden    Patient Care Team: Libby Maw, MD as PCP - General (Family Medicine) Cameron Sprang, MD as Consulting Physician (Neurology)   Assessment:   This is a routine wellness examination for Anthony [name]. Physical assessment deferred to PCP.   Exercise Activities and Dietary recommendations Current Exercise Habits: The patient does not participate in regular exercise at present, Exercise limited by: neurologic condition(s);orthopedic condition(s);psychological condition(s) Diet (meal preparation, eat out, water intake, caffeinated beverages, dairy products, fruits and vegetables): Dtr reports he is doing well with a soft diet and ensures.   Goals   None     Fall Risk Fall Risk  10/22/2019 03/11/2019 07/19/2018 04/10/2017 04/10/2016  Falls in the past year? 0 0 0 No No  Comment - - - - -  Number falls in past yr: 0 0 0 - -  Injury with Fall? 0 0 0 - -  Risk for fall due to : - - - - -  Risk for fall due to: Comment - - - - -  Follow up Education provided;Falls prevention discussed - - - -   Depression Screen PHQ 2/9 Scores 10/22/2019 07/04/2019 10/05/2014 09/09/2014  PHQ - 2 Score - - 0 0  Exception Documentation Other- indicate reason in comment box Other- indicate reason in comment box - -  Not completed - See Dr. Donell Beers for Psych - -    Cognitive Function MMSE - Mini Mental State Exam 04/12/2016 12/04/2014  Orientation to time 0 2  Orientation to Place 0 4  Registration 3 3  Attention/ Calculation 0 0  Recall 0 0  Language- name 2 objects 1 2  Language- repeat 1 1  Language- follow 3 step command 2 2  Language- read & follow direction 1 1  Write a sentence 0 0  Copy design 0 1  Total score 8 16        Immunization History  Administered Date(s) Administered  . Fluad Quad(high Dose 65+) 03/04/2019  . Influenza Split 05/05/2011  . Influenza Whole 04/03/2008, 03/30/2009, 03/19/2010  . Influenza, High Dose Seasonal PF 04/10/2016, 03/28/2017  . Influenza,inj,Quad PF,6+ Mos 06/03/2013  .  Influenza-Unspecified 04/19/2014  . Pneumococcal Conjugate-13 10/16/2013  . Pneumococcal Polysaccharide-23 03/13/2005  . Tdap 10/16/2013  . Zoster 06/20/2011     Screening Tests Health Maintenance  Topic Date Due  . COVID-19 Vaccine (1) Never done  . OPHTHALMOLOGY EXAM  01/21/2013  . FOOT EXAM  10/17/2014  . HEMOGLOBIN A1C  03/14/2017  . INFLUENZA VACCINE  01/18/2020  . TETANUS/TDAP  10/17/2023  . PNA vac Low Risk Adult  Completed       Plan:    Maintain current care plan w/ PCP and Hospice.   Please let us know if you need anything.  I have personally reviewed and noted the following in the patient's chart:   . Medical and social history . Use of alcohol, tobacco or illicit drugs  . Current medications and supplements . Functional ability and status . Nutritional status . Physical activity . Advanced directives . List of other physicians . Hospitalizations, surgeries, and ER visits in previous 12 months . Vitals . Screenings to include cognitive, depression, and falls . Referrals and appointments  In addition, I have reviewed and discussed with patient certain preventive protocols, quality metrics, and best practice recommendations. A written personalized care plan for preventive services as well as general preventive health recommendations were provided to patient.     Avon Gully, [place]  10/22/2019

## 2019-10-22 ENCOUNTER — Ambulatory Visit (INDEPENDENT_AMBULATORY_CARE_PROVIDER_SITE_OTHER): Payer: Medicare PPO | Admitting: *Deleted

## 2019-10-22 ENCOUNTER — Encounter: Payer: Self-pay | Admitting: *Deleted

## 2019-10-22 DIAGNOSIS — Z0000 Encounter for general adult medical examination without abnormal findings: Secondary | ICD-10-CM | POA: Diagnosis not present

## 2019-10-22 NOTE — Patient Instructions (Signed)
Maintain current care plan w/ PCP and Hospice.   Please let us know if you need anything.   Anthony [name] , Thank you for taking time for your Medicare Wellness Visit. I appreciate your ongoing commitment to your health goals. Please review the following plan we discussed and let me know if I can assist you in the future.    This is a list of the screening recommended for you and due dates:  Health Maintenance  Topic Date Due  . COVID-19 Vaccine (1) Never done  . Eye exam for diabetics  01/21/2013  . Complete foot exam   10/17/2014  . Hemoglobin A1C  03/14/2017  . Flu Shot  01/18/2020  . Tetanus Vaccine  10/17/2023  . Pneumonia vaccines  Completed    Preventive Care 48 Years and Older, Male Preventive care refers to lifestyle choices and visits with your health care provider that can promote health and wellness. This includes:  A yearly physical exam. This is also called an annual well check.  Regular dental and eye exams.  Immunizations.  Screening for certain conditions.  Healthy lifestyle choices, such as diet and exercise. What can I expect for my preventive care visit? Physical exam Your health care provider will check:  Height and weight. These may be used to calculate body mass index (BMI), which is a measurement that tells if you are at a healthy weight.  Heart rate and blood pressure.  Your skin for abnormal spots. Counseling Your health care provider may ask you questions about:  Alcohol, tobacco, and drug use.  Emotional well-being.  Home and relationship well-being.  Sexual activity.  Eating habits.  History of falls.  Memory and ability to understand (cognition).  Work and work Statistician. What immunizations do I need?  Influenza (flu) vaccine  This is recommended every year. Tetanus, diphtheria, and pertussis (Tdap) vaccine  You may need a Td booster every 10 years. Varicella (chickenpox) vaccine  You may need this vaccine if you  have not already been vaccinated. Zoster (shingles) vaccine  You may need this after age 23. Pneumococcal conjugate (PCV13) vaccine  One dose is recommended after age 48. Pneumococcal polysaccharide (PPSV23) vaccine  One dose is recommended after age 2. Measles, mumps, and rubella (MMR) vaccine  You may need at least one dose of MMR if you were born in 1957 or later. You may also need a second dose. Meningococcal conjugate (MenACWY) vaccine  You may need this if you have certain conditions. Hepatitis A vaccine  You may need this if you have certain conditions or if you travel or work in places where you may be exposed to hepatitis A. Hepatitis B vaccine  You may need this if you have certain conditions or if you travel or work in places where you may be exposed to hepatitis B. Haemophilus influenzae type b (Hib) vaccine  You may need this if you have certain conditions. You may receive vaccines as individual doses or as more than one vaccine together in one shot (combination vaccines). Talk with your health care provider about the risks and benefits of combination vaccines. What tests do I need? Blood tests  Lipid and cholesterol levels. These may be checked every 5 years, or more frequently depending on your overall health.  Hepatitis C test.  Hepatitis B test. Screening  Lung cancer screening. You may have this screening every year starting at age 22 if you have a 30-pack-year history of smoking and currently smoke or have quit within  the past 15 years.  Colorectal cancer screening. All adults should have this screening starting at age 20 and continuing until age 36. Your health care provider may recommend screening at age 31 if you are at increased risk. You will have tests every 1-10 years, depending on your results and the type of screening test.  Prostate cancer screening. Recommendations will vary depending on your family history and other risks.  Diabetes screening.  This is done by checking your blood sugar (glucose) after you have not eaten for a while (fasting). You may have this done every 1-3 years.  Abdominal aortic aneurysm (AAA) screening. You may need this if you are a current or former smoker.  Sexually transmitted disease (STD) testing. Follow these instructions at home: Eating and drinking  Eat a diet that includes fresh fruits and vegetables, whole grains, lean protein, and low-fat dairy products. Limit your intake of foods with high amounts of sugar, saturated fats, and salt.  Take vitamin and mineral supplements as recommended by your health care provider.  Do not drink alcohol if your health care provider tells you not to drink.  If you drink alcohol: ? Limit how much you have to 0-2 drinks a day. ? Be aware of how much alcohol is in your drink. In the [place] one drink equals one 12 oz bottle of beer (355 mL), one 5 oz glass of wine (148 mL), or one 1 oz glass of hard liquor (44 mL). Lifestyle  Take daily care of your teeth and gums.  Stay active. Exercise for at least 30 minutes on 5 or more days each week.  Do not use any products that contain nicotine or tobacco, such as cigarettes, e-cigarettes, and chewing tobacco. If you need help quitting, ask your health care provider.  If you are sexually active, practice safe sex. Use a condom or other form of protection to prevent STIs (sexually transmitted infections).  Talk with your health care provider about taking a low-dose aspirin or statin. What's next?  Visit your health care provider once a year for a well check visit.  Ask your health care provider how often you should have your eyes and teeth checked.  Stay up to date on all vaccines. This information is not intended to replace advice given to you by your health care provider. Make sure you discuss any questions you have with your health care provider. Document Revised: 05/30/2018 Document Reviewed: 05/30/2018 Elsevier  Patient Education  2020 [employer].

## 2019-10-26 ENCOUNTER — Other Ambulatory Visit: Payer: Self-pay | Admitting: Family Medicine

## 2019-11-11 ENCOUNTER — Encounter: Payer: Self-pay | Admitting: Neurology

## 2019-11-11 ENCOUNTER — Other Ambulatory Visit: Payer: Self-pay

## 2019-11-11 ENCOUNTER — Telehealth (INDEPENDENT_AMBULATORY_CARE_PROVIDER_SITE_OTHER): Payer: Medicare PPO | Admitting: Neurology

## 2019-11-11 DIAGNOSIS — F0390 Unspecified dementia without behavioral disturbance: Secondary | ICD-10-CM | POA: Diagnosis not present

## 2019-11-11 DIAGNOSIS — F03C0 Unspecified dementia, severe, without behavioral disturbance, psychotic disturbance, mood disturbance, and anxiety: Secondary | ICD-10-CM

## 2019-11-11 MED ORDER — TRAZODONE HCL 50 MG PO TABS: ORAL_TABLET | ORAL | 3 refills | Status: AC

## 2019-11-11 MED ORDER — DONEPEZIL HCL 10 MG PO TABS: ORAL_TABLET | ORAL | 3 refills | Status: AC

## 2019-11-11 MED ORDER — MEMANTINE HCL 10 MG PO TABS: 10.0000 mg | ORAL_TABLET | Freq: Two times a day (BID) | ORAL | 3 refills | Status: AC
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
## 2019-12-19 ENCOUNTER — Telehealth: Payer: Self-pay | Admitting: Family Medicine

## 2019-12-19 NOTE — Telephone Encounter (Signed)
Verbal order given and written order will be sent over for signature.

## 2019-12-19 NOTE — Telephone Encounter (Signed)
Anthony [name] from [facility], [phone], needs new verbal order for certified terminal illness. They are transferring him from [place] to [place].

## 2019-12-31 ENCOUNTER — Telehealth (INDEPENDENT_AMBULATORY_CARE_PROVIDER_SITE_OTHER): Payer: Medicare Other | Admitting: Psychiatry

## 2019-12-31 ENCOUNTER — Other Ambulatory Visit: Payer: Self-pay

## 2019-12-31 DIAGNOSIS — F0391 Unspecified dementia with behavioral disturbance: Secondary | ICD-10-CM

## 2019-12-31 MED ORDER — MIRTAZAPINE 15 MG PO TBDP: ORAL_TABLET | ORAL | 8 refills | Status: AC

## 2019-12-31 NOTE — Progress Notes (Signed)
Psychiatric Initial Adult Assessment   Patient Identification: Anthony [name] MRN:  [id number] Date of Evaluation:  12/31/2019 Referral Source: Dr. Melford Aase Chief Complaint:   Visit Diagnosi dementia with depression  History of Present Illness: Today we spoke to Anthony [name] the patient's daughter who also is the caregiver.  The patient has moved in with [place].  Hospice is now involved in the care of this patient.  The patient takes the Remeron as prescribed.  He is now gaining weight.  He was going downhill more more but since being treated for a cellulitis with antibiotics he actually is doing better.  He is not agitated at all.  He is really been written.  He needs to be fed.  He is incontinent.  Is harder to tell if he recognizes his environment.  I think he is clearly a very end-stage dementia.  There is no overt evidence of distress no evidence of psychosis and showing no use of alcohol.  Patient is clearly failing to thrive.  He is a very devoted family caring for him.   Associated Signs/Symptoms: Depression Symptoms:  weight loss, (Hypo) Manic Symptoms:   Anxiety Symptoms:   Psychotic Symptoms:   PTSD Symptoms:   Past Psychiatric History: none  Previous Psychotropic Medications:   Substance Abuse History in the last 12 months:    Consequences of Substance Abuse:   Past Medical History:  Past Medical History:  Diagnosis Date   ABNORMAL ELECTROCARDIOGRAM 05/03/2010   Alzheimer disease (HCC)    GALLSTONES 03/15/2007   GOUT 03/15/2007   HYPERLIPIDEMIA 03/15/2007   HYPERTENSION 03/15/2007   NEPHROPATHY, DIABETIC 03/15/2007   RENAL INSUFFICIENCY 04/30/2009   SHOULDER PAIN, BILATERAL 04/29/2008   UROLITHIASIS, HX OF 03/15/2007    Past Surgical History:  Procedure Laterality Date   NASAL SEPTUM SURGERY  1978    Family Psychiatric History:   Family History:  Family History  Problem Relation Age of Onset   Cancer Father        Prostate Cancer   Colon  cancer Neg Hx    Esophageal cancer Neg Hx    Stomach cancer Neg Hx     Social History:   Social History   Socioeconomic History   Marital status: Divorced    Spouse name: Not on file   Number of children: Not on file   Years of education: Not on file   Highest education level: Not on file  Occupational History   Occupation: Retired  Tobacco Use   Smoking status: Never Smoker   Smokeless tobacco: Never Used  Building services engineer Use: Never used  Substance and Sexual Activity   Alcohol use: No    Alcohol/week: 0.0 standard drinks   Drug use: No   Sexual activity: Never  Other Topics Concern   Not on file  Social History Narrative   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, music         Social Determinants of Health   Financial Resource Strain:    Difficulty of Paying Living Expenses:   Food Insecurity:    Worried About Programme researcher, broadcasting/film/video in the Last Year:    Barista in the Last Year:   Transportation Needs:    Freight forwarder (Medical):    Lack of Transportation (Non-Medical):   Physical Activity:    Days of Exercise per Week:    Minutes of Exercise per Session:   Stress:    Feeling of Stress :   Social Connections:  Frequency of Communication with Friends and Family:    Frequency of Social Gatherings with Friends and Family:    Attends Religious Services:    Active Member of Clubs or Organizations:    Attends Banker Meetings:    Marital Status:     Additional Social History:   Allergies:   Allergies  Allergen Reactions   Amlodipine-Atorvastatin Other (See Comments)    REACTION: LEG CRAMPS   Benazepril Hcl Other (See Comments)    REACTION: Cough   Ezetimibe Other (See Comments)    REACTION: edema    Metabolic Disorder Labs: Lab Results  Component Value Date   HGBA1C 5.1 09/11/2016   MPG 114 09/02/2013   No results found for: PROLACTIN Lab Results  Component Value Date   CHOL 160 09/11/2016    TRIG 112.0 09/11/2016   HDL 35.10 (L) 09/11/2016   CHOLHDL 5 09/11/2016   VLDL 22.4 09/11/2016   LDLCALC 102 (H) 09/11/2016   LDLCALC 94 09/10/2015     Current Medications: Current Outpatient Medications  Medication Sig Dispense Refill   acetaminophen (TYLENOL) 325 MG tablet Take 2 tablets (650 mg total) by mouth every 6 (six) hours as needed for mild pain, moderate pain or headache (or Fever >/= 101). 40 tablet 0   allopurinol (ZYLOPRIM) 100 MG tablet Take 1 tablet (100 mg total) by mouth daily. 90 tablet 1   aspirin 81 MG tablet Take 1 tablet (81 mg total) by mouth daily. 90 tablet 2   donepezil (ARICEPT) 10 MG tablet Take 1 tablet by mouth once daily with breakfast 90 tablet 3   doxazosin (CARDURA) 4 MG tablet Take 1 tablet by mouth once daily 90 tablet 1   furosemide (LASIX) 40 MG tablet Take 1 tablet by mouth once daily 90 tablet 0   memantine (NAMENDA) 10 MG tablet Take 1 tablet (10 mg total) by mouth 2 (two) times daily. 180 tablet 3   mirtazapine (REMERON SOL-TAB) 15 MG disintegrating tablet DISSOLVE 1 TABLET BY MOUTH AT BEDTIME 30 tablet 8   MORPHINE SULFATE, CONCENTRATE, PO Take 0.25 mLs by mouth. Mon, wed and fri     pantoprazole (PROTONIX) 40 MG tablet Take 1 tablet by mouth twice daily 180 tablet 0   Silverseal Hydrogel Dressing 4"X5" PADS Apply to wound and change every 7 days. 10 each 5   traZODone (DESYREL) 50 MG tablet Take 1/2 tablet every night 45 tablet 3   No current facility-administered medications for this visit.    Neurologic: Headache: No Seizure: No Paresthesias:No  Musculoskeletal: Strength & Muscle Tone: decreased Gait & Station: unsteady Patient leans: Backward and N/A  Psychiatric Specialty Exam: Review of Systems  Neurological: Positive for weakness.    There were no vitals taken for this visit.There is no height or weight on file to calculate BMI.  General Appearance: Casual  Eye Contact:  Fair  Speech:  Slurred  Volume:   Decreased  Mood:  Euthymic and Hopeless  Affect:  Congruent  Thought Process:  Disorganized  Orientation:  Negative  Thought Content:  WDL  Suicidal Thoughts:  No  Homicidal Thoughts:  No  Memory: absent short median long-term memory  Judgement:  Impaired  Insight:  Lacking  Psychomotor Activity:  Decreased  Concentration:    Recall:  Poor  Fund of Knowledge:Poor  Language: Poor  Akathisia:  No  Handed:  Right  AIMS (if indicated):    Assets:  Desire for Improvement  ADL's:  Impaired  Cognition: Impaired,  Severe  Sleep:  Normal     Treatment Plan Summary: 7/14/20211:44 PM   This patient is #1 problem is end-stage dementia.  His second problem is that of depression.  The patient will continue taking Remeron as prescribed.  We will call him lots of refills and they will switch over to the primary care doctor for future prescriptions.  At this time would be almost impossible for this patient to come for sending to see me.  I shared with his daughter Anthony [name] that she could call anytime to speak to me.

## 2020-01-01 ENCOUNTER — Other Ambulatory Visit: Payer: Self-pay | Admitting: Family Medicine

## 2020-01-01 DIAGNOSIS — R609 Edema, unspecified: Secondary | ICD-10-CM

## 2020-03-11 ENCOUNTER — Other Ambulatory Visit: Payer: Self-pay | Admitting: Family Medicine

## 2020-03-11 DIAGNOSIS — R609 Edema, unspecified: Secondary | ICD-10-CM

## 2020-03-27 ENCOUNTER — Other Ambulatory Visit: Payer: Self-pay | Admitting: Family Medicine

## 2020-03-27 DIAGNOSIS — R609 Edema, unspecified: Secondary | ICD-10-CM

## 2020-04-14 ENCOUNTER — Other Ambulatory Visit: Payer: Self-pay | Admitting: Family Medicine

## 2020-04-14 DIAGNOSIS — R609 Edema, unspecified: Secondary | ICD-10-CM

## 2020-05-02 ENCOUNTER — Other Ambulatory Visit: Payer: Self-pay | Admitting: Family Medicine

## 2020-05-02 DIAGNOSIS — R609 Edema, unspecified: Secondary | ICD-10-CM

## 2020-05-19 DEATH — deceased

## 2020-11-30 ENCOUNTER — Telehealth: Payer: Self-pay | Admitting: Neurology

## 2020-11-30 NOTE — Telephone Encounter (Signed)
Pt's family member let us know the patient passed away 05-31-20 at the reminder call.

## 2020-12-01 ENCOUNTER — Telehealth: Payer: Medicare PPO | Admitting: Neurology

## 2021-01-23 NOTE — Telephone Encounter (Signed)
error
